# Patient Record
Sex: Female | Born: 1945 | Race: White | Hispanic: No | Marital: Married | Smoking: Never smoker
Health system: Southern US, Community
[De-identification: ages and names within clinical notes are randomized; demographics above are authoritative.]

## PROBLEM LIST (undated history)

## (undated) DIAGNOSIS — E669 Obesity, unspecified: Secondary | ICD-10-CM

## (undated) DIAGNOSIS — R42 Dizziness and giddiness: Secondary | ICD-10-CM

## (undated) DIAGNOSIS — I4510 Unspecified right bundle-branch block: Secondary | ICD-10-CM

## (undated) DIAGNOSIS — E7800 Pure hypercholesterolemia, unspecified: Secondary | ICD-10-CM

## (undated) DIAGNOSIS — I4892 Unspecified atrial flutter: Secondary | ICD-10-CM

## (undated) DIAGNOSIS — I10 Essential (primary) hypertension: Secondary | ICD-10-CM

## (undated) DIAGNOSIS — I5030 Unspecified diastolic (congestive) heart failure: Secondary | ICD-10-CM

## (undated) DIAGNOSIS — R5383 Other fatigue: Secondary | ICD-10-CM

## (undated) DIAGNOSIS — I480 Paroxysmal atrial fibrillation: Secondary | ICD-10-CM

## (undated) HISTORY — PX: TONSILLECTOMY: SUR1361

## (undated) HISTORY — DX: Pure hypercholesterolemia, unspecified: E78.00

## (undated) HISTORY — DX: Unspecified diastolic (congestive) heart failure: I50.30

## (undated) HISTORY — PX: OTHER SURGICAL HISTORY: SHX169

## (undated) HISTORY — PX: WRIST FRACTURE SURGERY: SHX121

## (undated) HISTORY — DX: Other fatigue: R53.83

## (undated) HISTORY — DX: Obesity, unspecified: E66.9

## (undated) SURGERY — COLONOSCOPY
Anesthesia: Moderate Sedation

## (undated) SURGERY — COLONOSCOPY WITH PROPOFOL
Anesthesia: Monitor Anesthesia Care

---

## 2012-11-01 ENCOUNTER — Emergency Department (HOSPITAL_COMMUNITY)
Admission: EM | Admit: 2012-11-01 | Discharge: 2012-11-01 | Disposition: A | Discharge status: Home / Self Care | Payer: Medicare FFS | Attending: Emergency Medicine | Admitting: Emergency Medicine

## 2012-11-01 ENCOUNTER — Telehealth: Payer: Self-pay | Admitting: Orthopedic Surgery

## 2012-11-01 ENCOUNTER — Emergency Department (HOSPITAL_COMMUNITY): Payer: Medicare FFS

## 2012-11-01 ENCOUNTER — Encounter (HOSPITAL_COMMUNITY): Payer: Self-pay | Admitting: Emergency Medicine

## 2012-11-01 DIAGNOSIS — S8263XA Displaced fracture of lateral malleolus of unspecified fibula, initial encounter for closed fracture: Secondary | ICD-10-CM | POA: Insufficient documentation

## 2012-11-01 DIAGNOSIS — S8261XA Displaced fracture of lateral malleolus of right fibula, initial encounter for closed fracture: Secondary | ICD-10-CM

## 2012-11-01 DIAGNOSIS — X500XXA Overexertion from strenuous movement or load, initial encounter: Secondary | ICD-10-CM | POA: Insufficient documentation

## 2012-11-01 DIAGNOSIS — Y929 Unspecified place or not applicable: Secondary | ICD-10-CM | POA: Insufficient documentation

## 2012-11-01 DIAGNOSIS — Y9389 Activity, other specified: Secondary | ICD-10-CM | POA: Insufficient documentation

## 2012-11-01 HISTORY — DX: Dizziness and giddiness: R42

## 2012-11-01 MED ORDER — HYDROCODONE-ACETAMINOPHEN 5-325 MG PO TABS: 1.0000 | ORAL_TABLET | ORAL | Status: DC | PRN

## 2012-11-01 NOTE — ED Notes (Signed)
Pt c/o R ankle pain and edema after stepping down from a chair and inverting the ankle.

## 2012-11-01 NOTE — Telephone Encounter (Signed)
Patient called, initially spoke with Aurea Graff, approximately 4:00pm today, 11/01/12, following [facility] visit for problem, fracture of right ankle.  Patient was offered first available appointment, Tuesday, 11/06/12, due to Dr. Romeo Apple in surgery tomorrow (Friday) and also on Monday, 11/05/12.  Patient states "I really don't feel I am able to wait"; therefore, she will check with other orthopaedic surgeons and call back if needs to accept the 11/06/12 appointment.  Her contact ph# is [phone].

## 2012-11-01 NOTE — ED Provider Notes (Signed)
Medical screening examination/treatment/procedure(s presents to the ER after a fall. Patient is a 67 year old female who presents complaining of pain after a fall. She is complaining of pain in the right ankle. She denies other injury or trauma) were conducted as a shared visit with non-physician practitioner(s) and myself.  I personally evaluated the patient during the encounter.    On exam the vitals are stable the patient is afebrile. The right ankle is noted to have swelling and tenderness to palpation over the lateral malleolus. Distal sensation, motor, and pulses are intact.  X-rays reveal an avulsion fracture of the distal fibula. This will be placed in a splint the patient will have orthopedic followup in the next 2 days for cast. She is to return as needed for any problems. In the meantime she is to ice, elevate, and non-weight-bear until that time.  Geoffery Lyons, MD 11/01/12 1726

## 2012-11-01 NOTE — ED Provider Notes (Signed)
CSN: [id number]     Arrival date & time 11/01/12  1417 History   First MD Initiated Contact with Patient 11/01/12 1431     Chief Complaint  Patient presents with  . Ankle Pain   (Consider location/radiation/quality/duration/timing/severity/associated sxs/prior Treatment) Patient is a 67 y.o. female presenting with ankle pain. The history is provided by the patient.  Ankle Pain Location:  Ankle Time since incident:  3 hours Injury: yes   Mechanism of injury: fall   Fall:    Fall occurred:  From a stool   Impact surface:  Hard floor   Point of impact:  Feet Ankle location:  R ankle Pain details:    Quality:  Aching and shooting   Radiates to:  Does not radiate   Severity:  Moderate   Onset quality:  Sudden   Timing:  Constant   Progression:  Unchanged Chronicity:  New Foreign body present:  No foreign bodies Prior injury to area:  No Relieved by:  Nothing Worsened by:  Bearing weight and activity Ineffective treatments:  None tried Associated symptoms: decreased ROM and swelling   Associated symptoms: no fever and no neck pain    Deven Audi is a 67 y.o. female who presents to the ED with right ankle pain. She was standing in a chair to reach something and when she stepped down lost her footing and when her foot came down the ankle rolled inward.   Past Medical History  Diagnosis Date  . Vertigo    Past Surgical History  Procedure Laterality Date  . Wrist fracture surgery     Family History  Problem Relation Age of Onset  . Cancer Other    History  Substance Use Topics  . Smoking status: Not on file  . Smokeless tobacco: Not on file  . Alcohol Use: Not on file   OB History   Grav Para Term Preterm Abortions TAB SAB Ect Mult Living   3 3 3       2      Review of Systems  Constitutional: Negative for fever and chills.  HENT: Negative for neck pain.   Respiratory: Negative for shortness of breath.   Cardiovascular: Negative for chest pain.   Gastrointestinal: Negative for nausea, vomiting and abdominal pain.  Musculoskeletal:       Right ankle pain   Skin: Negative for wound.  Psychiatric/Behavioral: Negative for confusion. The patient is not nervous/anxious.     Allergies  Review of patient's allergies indicates not on file.  Home Medications  No current outpatient prescriptions on file. Ht 5\' 8"  (1.727 m)  Wt 192 lb 8 oz (87.317 kg)  BMI 29.28 kg/m2 Physical Exam  Nursing note and vitals reviewed. Constitutional: She is oriented to person, place, and time. She appears well-developed and well-nourished. No distress.  HENT:  Head: Normocephalic and atraumatic.  Eyes: EOM are normal.  Neck: Normal range of motion. Neck supple.  Cardiovascular: Normal rate.   Pulmonary/Chest: Effort normal.  Abdominal: Soft. There is no tenderness.  Musculoskeletal:       Right ankle: She exhibits decreased range of motion and swelling. She exhibits no laceration and normal pulse. Tenderness. Lateral malleolus tenderness found. Achilles tendon normal.  Pedal pulse strong, adequate circulation, good touch sensation. Swelling noted to lateral aspect of the right ankle.  Neurological: She is alert and oriented to person, place, and time. She has normal strength. No cranial nerve deficit or sensory deficit. Gait (due to pain) abnormal.  Skin: Skin is  warm and dry.  Psychiatric: She has a normal mood and affect. Her behavior is normal.   Dg Ankle Complete Right  11/01/2012   *RADIOLOGY REPORT*  Clinical Data: Right ankle pain and swelling after fall.  RIGHT ANKLE - COMPLETE 3+ VIEW  Comparison: None.  Findings: Mildly displaced fracture of the distal fibula is noted with overlying soft tissue swelling.  Talar dome appears intact. Joint spaces are intact.  IMPRESSION: Mildly displaced lateral malleolar fracture with overlying soft tissue swelling.   Original Report Authenticated By: Lupita Raider.,  M.D.     ED Course: Dr. Judd Lien in to  examine the patient and discuss x-ray results.  Procedures  MDM  67 y.o. female with lateral malleolar fracture. Placed in posterior splint and given crutches and ice pack. Patient to follow up with Dr. Romeo Apple.  I have reviewed this patient's vital signs, nurses notes, appropriate imaging and discussed findings and plan of care with the patient and her husband. She voices understanding.  Patient remains neurovascularly intact and stable for discharge home without any immediate complications.   Medication List    TAKE these medications       HYDROcodone-acetaminophen 5-325 MG per tablet  Commonly known as:  NORCO/VICODIN  Take 1 tablet by mouth every 4 (four) hours as needed.      ASK your doctor about these medications       aspirin 81 MG tablet  Take 81 mg by mouth daily as needed for pain.     CALCIUM MAGNESIUM PO  Take 1 tablet by mouth daily.     FLAX SEEDS PO  Take 1 tablet by mouth daily.     OVER THE COUNTER MEDICATION  Take 1 tablet by mouth daily as needed (allergy).            [facility] Orlene Och, NP 11/01/12 1710

## 2013-01-20 ENCOUNTER — Emergency Department (HOSPITAL_COMMUNITY): Payer: Medicare FFS

## 2013-01-20 ENCOUNTER — Inpatient Hospital Stay (HOSPITAL_COMMUNITY)
Admission: EM | Admit: 2013-01-20 | Discharge: 2013-01-22 | DRG: 310 | Disposition: A | Discharge status: Home / Self Care | Payer: Medicare FFS | Attending: Family Medicine | Admitting: Family Medicine

## 2013-01-20 ENCOUNTER — Encounter (HOSPITAL_COMMUNITY): Payer: Self-pay | Admitting: Emergency Medicine

## 2013-01-20 DIAGNOSIS — IMO0001 Reserved for inherently not codable concepts without codable children: Secondary | ICD-10-CM | POA: Diagnosis present

## 2013-01-20 DIAGNOSIS — I4892 Unspecified atrial flutter: Secondary | ICD-10-CM | POA: Diagnosis present

## 2013-01-20 DIAGNOSIS — I10 Essential (primary) hypertension: Secondary | ICD-10-CM | POA: Diagnosis present

## 2013-01-20 DIAGNOSIS — Z7982 Long term (current) use of aspirin: Secondary | ICD-10-CM

## 2013-01-20 DIAGNOSIS — I4891 Unspecified atrial fibrillation: Secondary | ICD-10-CM

## 2013-01-20 DIAGNOSIS — E876 Hypokalemia: Secondary | ICD-10-CM | POA: Diagnosis present

## 2013-01-20 DIAGNOSIS — F3289 Other specified depressive episodes: Secondary | ICD-10-CM | POA: Diagnosis present

## 2013-01-20 DIAGNOSIS — F329 Major depressive disorder, single episode, unspecified: Secondary | ICD-10-CM

## 2013-01-20 DIAGNOSIS — F32A Depression, unspecified: Secondary | ICD-10-CM | POA: Diagnosis present

## 2013-01-20 HISTORY — DX: Paroxysmal atrial fibrillation: I48.0

## 2013-01-20 HISTORY — DX: Essential (primary) hypertension: I10

## 2013-01-20 LAB — MRSA PCR SCREENING: MRSA by PCR: NEGATIVE

## 2013-01-20 LAB — COMPREHENSIVE METABOLIC PANEL
ALT: 30 U/L (ref 0–35)
Albumin: 4 g/dL (ref 3.5–5.2)
Alkaline Phosphatase: 108 U/L (ref 39–117)
BUN: 14 mg/dL (ref 6–23)
Chloride: 101 mEq/L (ref 96–112)
Glucose, Bld: 106 mg/dL — ABNORMAL HIGH (ref 70–99)
Potassium: 3.3 mEq/L — ABNORMAL LOW (ref 3.5–5.1)
Sodium: 142 mEq/L (ref 135–145)
Total Bilirubin: 0.3 mg/dL (ref 0.3–1.2)

## 2013-01-20 LAB — CBC WITH DIFFERENTIAL/PLATELET
Basophils Relative: 1 % (ref 0–1)
Hemoglobin: 13.8 g/dL (ref 12.0–15.0)
Lymphs Abs: 1.6 10*3/uL (ref 0.7–4.0)
Monocytes Relative: 12 % (ref 3–12)
Neutro Abs: 3.2 10*3/uL (ref 1.7–7.7)
Neutrophils Relative %: 56 % (ref 43–77)
RBC: 4.7 MIL/uL (ref 3.87–5.11)

## 2013-01-20 LAB — TROPONIN I: Troponin I: <0.3 ng/mL (ref <0.30)

## 2013-01-20 LAB — MAGNESIUM: Magnesium: 2.1 mg/dL (ref 1.5–2.5)

## 2013-01-20 LAB — ETHANOL: Alcohol, Ethyl (B): <11 mg/dL (ref 0–11)

## 2013-01-20 MED ORDER — ACETAMINOPHEN 325 MG PO TABS
650.0000 mg | ORAL_TABLET | Freq: Four times a day (QID) | ORAL | Status: DC | PRN
  Administered 2013-01-20 – 2013-01-21 (×2): 650 mg via ORAL
  Filled 2013-01-20 (×2): qty 2

## 2013-01-20 MED ORDER — ASPIRIN EC 81 MG PO TBEC
81.0000 mg | DELAYED_RELEASE_TABLET | Freq: Every day | ORAL | Status: DC
  Filled 2013-01-20: qty 1

## 2013-01-20 MED ORDER — BISACODYL 5 MG PO TBEC: 5.0000 mg | DELAYED_RELEASE_TABLET | Freq: Every day | ORAL | Status: DC | PRN

## 2013-01-20 MED ORDER — DILTIAZEM HCL ER COATED BEADS 120 MG PO CP24
120.0000 mg | ORAL_CAPSULE | Freq: Every day | ORAL | Status: DC
  Administered 2013-01-20: 120 mg via ORAL
  Filled 2013-01-20: qty 1

## 2013-01-20 MED ORDER — DILTIAZEM HCL 25 MG/5ML IV SOLN
10.0000 mg | Freq: Once | INTRAVENOUS | Status: AC
  Administered 2013-01-20: 10 mg via INTRAVENOUS

## 2013-01-20 MED ORDER — FLEET ENEMA 7-19 GM/118ML RE ENEM: 1.0000 | ENEMA | Freq: Once | RECTAL | Status: AC | PRN

## 2013-01-20 MED ORDER — ENOXAPARIN SODIUM 40 MG/0.4ML ~~LOC~~ SOLN
40.0000 mg | Freq: Every day | SUBCUTANEOUS | Status: DC
  Administered 2013-01-20 – 2013-01-21 (×2): 40 mg via SUBCUTANEOUS
  Filled 2013-01-20 (×2): qty 0.4

## 2013-01-20 MED ORDER — NICARDIPINE HCL IN NACL 20-0.86 MG/200ML-% IV SOLN
INTRAVENOUS | Status: AC
  Filled 2013-01-20: qty 200

## 2013-01-20 MED ORDER — DILTIAZEM HCL 100 MG IV SOLR
5.0000 mg/h | Freq: Once | INTRAVENOUS | Status: AC
  Administered 2013-01-20: 5 mg/h via INTRAVENOUS
  Filled 2013-01-20: qty 100

## 2013-01-20 MED ORDER — TRAZODONE HCL 50 MG PO TABS
25.0000 mg | ORAL_TABLET | Freq: Every evening | ORAL | Status: DC | PRN
  Administered 2013-01-20: 25 mg via ORAL
  Filled 2013-01-20: qty 1

## 2013-01-20 MED ORDER — ADENOSINE 6 MG/2ML IV SOLN
6.0000 mg | Freq: Once | INTRAVENOUS | Status: AC
  Administered 2013-01-20: 6 mg via INTRAVENOUS

## 2013-01-20 MED ORDER — POTASSIUM CHLORIDE 10 MEQ/100ML IV SOLN
10.0000 meq | Freq: Once | INTRAVENOUS | Status: AC
  Administered 2013-01-20: 10 meq via INTRAVENOUS
  Filled 2013-01-20: qty 100

## 2013-01-20 MED ORDER — SODIUM CHLORIDE 0.9 % IV SOLN
INTRAVENOUS | Status: DC
  Administered 2013-01-20: 16:00:00 via INTRAVENOUS

## 2013-01-20 MED ORDER — POTASSIUM CHLORIDE CRYS ER 20 MEQ PO TBCR
40.0000 meq | EXTENDED_RELEASE_TABLET | ORAL | Status: AC
  Administered 2013-01-20 – 2013-01-21 (×3): 40 meq via ORAL
  Filled 2013-01-20 (×3): qty 2

## 2013-01-20 MED ORDER — DILTIAZEM HCL 100 MG IV SOLR
15.0000 mg/h | Freq: Once | INTRAVENOUS | Status: AC
  Administered 2013-01-20: 15 mg/h via INTRAVENOUS

## 2013-01-20 MED ORDER — ADENOSINE 6 MG/2ML IV SOLN
INTRAVENOUS | Status: AC
  Administered 2013-01-20: 6 mg via INTRAVENOUS
  Filled 2013-01-20: qty 2

## 2013-01-20 MED ORDER — DILTIAZEM HCL 25 MG/5ML IV SOLN
15.0000 mg | Freq: Once | INTRAVENOUS | Status: AC
  Administered 2013-01-20: 15 mg via INTRAVENOUS

## 2013-01-20 MED ORDER — ONDANSETRON HCL 4 MG/2ML IJ SOLN: 4.0000 mg | Freq: Four times a day (QID) | INTRAMUSCULAR | Status: DC | PRN

## 2013-01-20 MED ORDER — DILTIAZEM HCL 25 MG/5ML IV SOLN
INTRAVENOUS | Status: AC
  Administered 2013-01-20: 10 mg via INTRAVENOUS
  Filled 2013-01-20: qty 5

## 2013-01-20 NOTE — ED Provider Notes (Addendum)
CSN: [id number]     Arrival date & time 01/20/13  1538 History  This chart was scribed for Lindsay Baker, MD by Blanchard Kelch, ED Scribe. The patient was seen in room APA05/APA05. Patient's care was started at 3:51 PM.    Chief Complaint  Patient presents with  . Tachycardia    The history is provided by the patient. No language interpreter was used.    HPI Comments: Lindsay Whitehead is a 67 y.o. female who presents to the Emergency Department complaining of tachycardia that began an hour and a half ago. She was sitting on the couch watching a movie when it occurred. She had associated dizziness with the episode that has since subsided. She denies any chest pain or diaphoresis with the episode. She denies any aggravating or alleviating factors. She has had similar episodes in the past but denies knowing a diagnosis for the problem.She was seen by her PCP a year ago for a routine physical and was told she had an irregular EKG, but denies that she was diagnosed with a heart arrythmia, including SVT, atrial fibrillation or atrial fluttering. She does not take any prescription medications currently. She denies a past history of hypertension, hyperlipidemia or diabetes. She has not been sick in the past 24 hours. She drinks two cups of regular coffee a day.     Past Medical History  Diagnosis Date  . Vertigo   . Abnormal EKG    Past Surgical History  Procedure Laterality Date  . Wrist fracture surgery     Family History  Problem Relation Age of Onset  . Cancer Other    History  Substance Use Topics  . Smoking status: Never Smoker   . Smokeless tobacco: Never Used  . Alcohol Use: 0.0 oz/week    4-5 drink(s) per week   OB History   Grav Para Term Preterm Abortions TAB SAB Ect Mult Living   3 3 3       2      Review of Systems  Constitutional: Negative for diaphoresis.  Cardiovascular: Positive for palpitations. Negative for chest pain.  Neurological: Positive for dizziness.   All other systems reviewed and are negative.    Allergies  Bystolic and Metoprolol  Home Medications   Current Outpatient Rx  Name  Route  Sig  Dispense  Refill  . aspirin 81 MG tablet   Oral   Take 81 mg by mouth daily as needed for pain.         . Calcium-Magnesium-Vitamin D (CALCIUM MAGNESIUM PO)   Oral   Take 1 tablet by mouth daily.         . Flaxseed, Linseed, (FLAX SEEDS PO)   Oral   Take 1 tablet by mouth daily.         Marland Kitchen HYDROcodone-acetaminophen (NORCO/VICODIN) 5-325 MG per tablet   Oral   Take 1 tablet by mouth every 4 (four) hours as needed.   15 tablet   0   . OVER THE COUNTER MEDICATION   Oral   Take 1 tablet by mouth daily as needed (allergy).          Triage Vitals: BP 174/101  Pulse 157  Temp(Src) 98.1 F (36.7 C) (Oral)  Resp 20  Ht 5\' 8"  (1.727 m)  Wt 186 lb (84.369 kg)  BMI 28.29 kg/m2  SpO2 99%  Physical Exam  Nursing note and vitals reviewed. Constitutional: She is oriented to person, place, and time. She appears well-developed and well-nourished.  Non-toxic appearance. No distress.  HENT:  Head: Normocephalic and atraumatic.  Eyes: Conjunctivae, EOM and lids are normal. Pupils are equal, round, and reactive to light.  Neck: Normal range of motion. Neck supple. No tracheal deviation present. No mass present.  Cardiovascular: Normal heart sounds.  An irregular rhythm present. Tachycardia present.  Exam reveals no gallop.   No murmur heard. Pulmonary/Chest: Effort normal and breath sounds normal. No stridor. No respiratory distress. She has no decreased breath sounds. She has no wheezes. She has no rhonchi. She has no rales.  Abdominal: Soft. Normal appearance and bowel sounds are normal. She exhibits no distension. There is no tenderness. There is no rebound and no CVA tenderness.  Musculoskeletal: Normal range of motion. She exhibits no edema and no tenderness.  Neurological: She is alert and oriented to person, place, and time.  She has normal strength. No cranial nerve deficit or sensory deficit. GCS eye subscore is 4. GCS verbal subscore is 5. GCS motor subscore is 6.  Skin: Skin is warm and dry. No abrasion and no rash noted.  Psychiatric: She has a normal mood and affect. Her speech is normal and behavior is normal.    ED Course  Procedures (including critical care time)  DIAGNOSTIC STUDIES: Oxygen Saturation is 99% on room air, normal by my interpretation.    COORDINATION OF CARE: 4:02 PM -Will order CMP, CBC, Troponin I, T4, TSH, and BNP labs and a chest x-ray. Patient verbalizes understanding and agrees with treatment plan.    Labs Review Labs Reviewed  COMPREHENSIVE METABOLIC PANEL - Abnormal; Notable for the following:    Potassium 3.3 (*)    Glucose, Bld 106 (*)    GFR calc non Af Amer 69 (*)    GFR calc Af Amer 80 (*)    All other components within normal limits  CBC WITH DIFFERENTIAL  TROPONIN I  T4  TSH  PRO B NATRIURETIC PEPTIDE   Imaging Review Dg Chest Port 1 View  01/20/2013   CLINICAL DATA:  Chest pain  EXAM: PORTABLE CHEST - 1 VIEW  COMPARISON:  None.  FINDINGS: Cardiac leads project over the chest. Heart size appears prominent for portable technique. There is atherosclerotic calcification of the thoracic aortic arch. There is pulmonary vascular congestion. No visible pulmonary edema or focal airspace disease. No pleural effusion is appreciated. Negative for pneumothorax. The trachea is midline. No acute bony abnormality.  IMPRESSION: Probable cardiomegaly.  Pulmonary vascular congestion without edema.   Electronically Signed   By: Britta Mccreedy M.D.   On: 01/20/2013 16:35    EKG Interpretation    Date/Time:  Sunday January 20 2013 15:54:40 EST Ventricular Rate:  150 PR Interval:  112 QRS Duration: 110 QT Interval:  316 QTC Calculation: 499 R Axis:   -62 Text Interpretation:  Sinus tachycardia Incomplete right bundle branch block Left anterior fascicular block Marked ST  abnormality, possible inferior subendocardial injury Marked ST abnormality, possible anterior subendocardial injury Abnormal ECG No previous ECGs available Confirmed by Rohaan Durnil  MD, Gwenith Tschida ([id number]) on 01/20/2013 5:14:58 PM            MDM  No diagnosis found. Pt given adenosine without change to her rhythm--repeat cg shows afib, will given cardizem and monitor  5:15 PM Pt given cardizem and remained tachycardic with atrial fibrillation. Patient recalls again with Cardizem and her Cardizem drip was increased. She has remained hemodynamically stable. Will require admission.  5:50 PM Hospitalist requested I speak with cardiology about patient's EKG.  Spoke with Dr. Donnie Aho and he reviewed the patient's EKG he agreed that there is no signs of STEMI.  CRITICAL CARE Performed by: Lindsay Whitehead Total critical care time: 60 Critical care time was exclusive of separately billable procedures and treating other patients. Critical care was necessary to treat or prevent imminent or life-threatening deterioration. Critical care was time spent personally by me on the following activities: development of treatment plan with patient and/or surrogate as well as nursing, discussions with consultants, evaluation of patient's response to treatment, examination of patient, obtaining history from patient or surrogate, ordering and performing treatments and interventions, ordering and review of laboratory studies, ordering and review of radiographic studies, pulse oximetry and re-evaluation of patient's condition.      Lindsay Baker, MD 01/20/13 1716  Lindsay Baker, MD 01/20/13 [id number]

## 2013-01-20 NOTE — ED Notes (Signed)
Administered another 10mg  of cardizem per EDP Freida Busman

## 2013-01-20 NOTE — ED Notes (Signed)
Pt was sitting at home when she felt her heart beating faster, started becoming dizzy, denies any sob, or nausea, reports that she has had problems with her heart rate beating fast at times, had one episode of feeling like she was going to pass out last week, has been having indigestion. States that she feels better upon arrival to er.

## 2013-01-20 NOTE — H&P (Signed)
[facility] History and Physical  Lindsay Whitehead  WJX:[id number]  DOB: [birth year]   DOA: 01/20/2013   PCP:   Dr. Modesto Charon in [place]  Chief Complaint:  Palpitations and feeling tired since mid-day today  HPI: Lindsay Whitehead is a 67 y.o. female.   Denies significant past medical history; her blood pressure sometimes up sometimes down, if she takes blood pressure medications he gets very low. She has been having episodic flutterings in her chest the past few months, and feeling tired but has never been diagnosed with a cardiac arrhythmia. She has never seen a cardiologist. She has been told by her primary that she has an abnormal EKG and he prescribed some medication for her to take when she does not feel right, she is taken them because she doesn't understand what they are for, and she says her doctor as never explained clearly what they are for.  Drinks 4 cups of coffee and 4 cups of tea per day.  Drinks wine or 2 glasses of champagne twice per week. Denies illicit drug use.  She lives in [place] but has a winter home in [place], and notes dyspnea on exertion climbing hills he sent When she drove down from [place], she made sure to stop and walk around every 3 hours   Rewiew of Systems:   All systems negative except as marked bold or noted in the HPI;  Constitutional:    malaise, fever and chills. ;  Eyes:   eye pain, redness and discharge. ;  ENMT:   ear pain, hoarseness, nasal congestion, sinus pressure and sore throat. ;  Cardiovascular:    chest pain, palpitations, diaphoresis, dyspnea on exertion and peripheral edema.  Respiratory:   cough, hemoptysis, wheezing and stridor. ;  Gastrointestinal:  nausea, vomiting, diarrhea, constipation, abdominal pain, melena, blood in stool, hematemesis, jaundice and rectal bleeding. unusual weight loss..   Genitourinary:    frequency, dysuria, incontinence,flank pain and hematuria; Musculoskeletal:   back pain and neck  pain.  swelling and trauma.;  Skin: .  pruritus, rash, abrasions, bruising and skin lesion.; ulcerations Neuro:    headache, lightheadedness and neck stiffness.  weakness, altered level of consciousness, altered mental status, extremity weakness, burning feet, involuntary movement, seizure and syncope.  Psych:    anxiety, depression, insomnia, tearfulness, panic attacks, hallucinations, paranoia, suicidal or homicidal ideation      Past Medical History  Diagnosis Date  . Vertigo   . Abnormal EKG   . Hypertension     Past Surgical History  Procedure Laterality Date  . Wrist fracture surgery      Medications:  HOME MEDS: Prior to Admission medications   Medication Sig Start Date End Date Taking? Authorizing Provider  aspirin 81 MG tablet Take 81 mg by mouth daily as needed for pain.   Yes Historical Provider, MD  Calcium-Magnesium-Vitamin D (CALCIUM MAGNESIUM PO) Take 1 tablet by mouth 4 (four) times a week.    Yes Historical Provider, MD  Flaxseed, Linseed, (FLAX SEEDS PO) Take 1 tablet by mouth daily.   Yes Historical Provider, MD  LECITHIN PO Take 1 tablet by mouth 4 (four) times a week.   Yes Historical Provider, MD  Multiple Vitamins-Minerals (ZINC PO) Take 1 tablet by mouth 4 (four) times a week.   Yes Historical Provider, MD     Allergies:  Allergies  Allergen Reactions  . Bystolic [Nebivolol Hcl] Other (See Comments)    Extreme chest pains/ lowering of blood pressure causing dizziness, falling  . Metoprolol  Other (See Comments)    Extreme drop in blood pressure, vertigo resulted  . Lasix [Furosemide] Other (See Comments)    Cramping in thigh area     Social History:   reports that she has never smoked. She has never used smokeless tobacco. She reports that she drinks alcohol. She reports that she does not use illicit drugs.  Family History: Family History  Problem Relation Age of Onset  . Cancer Other      Physical Exam: Filed Vitals:   01/20/13 1755  01/20/13 1803 01/20/13 1833 01/20/13 2000  BP: 136/94   148/70  Pulse: 59 73 66   Temp:    98 F (36.7 C)  TempSrc:    Oral  Resp: 18 15 14 13   Height:      Weight:      SpO2: 97% 98% 97%    Blood pressure 148/70, pulse 66, temperature 98 F (36.7 C), temperature source Oral, resp. rate 13, height 5\' 8"  (1.727 m), weight 84.369 kg (186 lb), SpO2 97.00%. Body mass index is 28.29 kg/(m^2).   GEN:  Pleasant smiling but depressed-looking Caucasian lady lying bed ; cooperative with exam PSYCH:  alert and oriented x4;   affect is appropriate. HEENT: Mucous membranes pink and anicteric; PERRLA; EOM intact; no cervical lymphadenopathy nor thyromegaly or carotid bruit; no JVD; Breasts:: Not examined CHEST WALL: No tenderness CHEST: Normal respiration, clear to auscultation bilaterally HEART: Regular rate and rhythm; no murmurs rubs or gallops BACK: No kyphosis no scoliosis; no CVA tenderness ABDOMEN: Obese, soft non-tender; no masses, no organomegaly, normal abdominal bowel sounds; no intertriginous candida. Rectal Exam: Not done EXTREMITIES:  age-appropriate arthropathy of the hands and knees; no edema; no ulcerations. Genitalia: not examined PULSES: 2+ and symmetric SKIN: Normal hydration no rash or ulceration CNS: Cranial nerves 2-12 grossly intact no focal lateralizing neurologic deficit   Labs on Admission:  Basic Metabolic Panel:  Recent Labs Lab 01/20/13 1613  NA 142  K 3.3*  CL 101  CO2 30  GLUCOSE 106*  BUN 14  CREATININE 0.85  CALCIUM 9.9   Liver Function Tests:  Recent Labs Lab 01/20/13 1613  AST 33  ALT 30  ALKPHOS 108  BILITOT 0.3  PROT 8.0  ALBUMIN 4.0   No results found for this basename: LIPASE, AMYLASE,  in the last 168 hours No results found for this basename: AMMONIA,  in the last 168 hours CBC:  Recent Labs Lab 01/20/13 1613  WBC 5.6  NEUTROABS 3.2  HGB 13.8  HCT 41.8  MCV 88.9  PLT 276   Cardiac Enzymes:  Recent Labs Lab  01/20/13 1613  TROPONINI <0.30   BNP: No components found with this basename: POCBNP,  D-dimer: No components found with this basename: D-DIMER,  CBG: No results found for this basename: GLUCAP,  in the last 168 hours  Radiological Exams on Admission: Dg Chest Port 1 View  01/20/2013   CLINICAL DATA:  Chest pain  EXAM: PORTABLE CHEST - 1 VIEW  COMPARISON:  None.  FINDINGS: Cardiac leads project over the chest. Heart size appears prominent for portable technique. There is atherosclerotic calcification of the thoracic aortic arch. There is pulmonary vascular congestion. No visible pulmonary edema or focal airspace disease. No pleural effusion is appreciated. Negative for pneumothorax. The trachea is midline. No acute bony abnormality.  IMPRESSION: Probable cardiomegaly.  Pulmonary vascular congestion without edema.   Electronically Signed   By: Britta Mccreedy M.D.   On: 01/20/2013 16:35  EKG: Independently reviewed. Current EKG : Sinus rhythm with occasional atrial ectopics; incomplete right bundle branch block.  Assessment/Plan   Active Problems:   Atrial fibrillation with rapid ventricular response   Hypokalemia   Elevated blood pressure   Depression  PLAN: Discontinue Cardizem drip since he is now converted to sinus rhythm Potassium replacement Start low-dose long-acting Cardizem Discussed the nature of atrial fibrillation and get cardio referral as an in or outpatient  Other plans as per orders.  Code Status: full    Lindsay Whitehead Nocturnist [facility] Pager [phone]   01/20/2013, 8:49 PM

## 2013-01-20 NOTE — ED Notes (Signed)
AC called for Cardizem drip.

## 2013-01-20 NOTE — ED Notes (Signed)
Pt has converted on monitor, HR 81 NSR, will confirm with EKG

## 2013-01-21 ENCOUNTER — Encounter (HOSPITAL_COMMUNITY): Payer: Self-pay | Admitting: Adult Health

## 2013-01-21 DIAGNOSIS — I369 Nonrheumatic tricuspid valve disorder, unspecified: Secondary | ICD-10-CM

## 2013-01-21 LAB — COMPREHENSIVE METABOLIC PANEL
ALT: 26 U/L (ref 0–35)
Albumin: 3.6 g/dL (ref 3.5–5.2)
Alkaline Phosphatase: 89 U/L (ref 39–117)
BUN: 9 mg/dL (ref 6–23)
CO2: 29 mEq/L (ref 19–32)
Calcium: 9.3 mg/dL (ref 8.4–10.5)
Creatinine, Ser: 0.7 mg/dL (ref 0.50–1.10)
GFR calc Af Amer: >90 mL/min (ref >90)
GFR calc non Af Amer: 88 mL/min — ABNORMAL LOW (ref >90)
Glucose, Bld: 104 mg/dL — ABNORMAL HIGH (ref 70–99)
Potassium: 4.1 mEq/L (ref 3.5–5.1)
Sodium: 141 mEq/L (ref 135–145)
Total Bilirubin: 0.5 mg/dL (ref 0.3–1.2)
Total Protein: 6.9 g/dL (ref 6.0–8.3)

## 2013-01-21 LAB — URINALYSIS, ROUTINE W REFLEX MICROSCOPIC
Bilirubin Urine: NEGATIVE
Glucose, UA: NEGATIVE mg/dL
Ketones, ur: NEGATIVE mg/dL
Leukocytes, UA: NEGATIVE
Nitrite: NEGATIVE
Protein, ur: NEGATIVE mg/dL

## 2013-01-21 LAB — CBC
HCT: 36.8 % (ref 36.0–46.0)
Hemoglobin: 12 g/dL (ref 12.0–15.0)
MCH: 29.2 pg (ref 26.0–34.0)
MCHC: 32.6 g/dL (ref 30.0–36.0)
MCV: 89.5 fL (ref 78.0–100.0)
RDW: 13.5 % (ref 11.5–15.5)

## 2013-01-21 LAB — HEMOGLOBIN A1C
Hgb A1c MFr Bld: 5.4 % (ref <5.7)
Mean Plasma Glucose: 108 mg/dL (ref <117)

## 2013-01-21 MED ORDER — DILTIAZEM HCL 30 MG PO TABS
30.0000 mg | ORAL_TABLET | Freq: Four times a day (QID) | ORAL | Status: DC
  Administered 2013-01-21 – 2013-01-22 (×4): 30 mg via ORAL
  Filled 2013-01-21 (×4): qty 1

## 2013-01-21 MED ORDER — ASPIRIN EC 325 MG PO TBEC
325.0000 mg | DELAYED_RELEASE_TABLET | Freq: Every day | ORAL | Status: DC
  Administered 2013-01-21 – 2013-01-22 (×2): 325 mg via ORAL
  Filled 2013-01-21 (×2): qty 1

## 2013-01-21 NOTE — Progress Notes (Signed)
[facility] PROGRESS NOTE  Lindsay Whitehead ZOX:[id number] DOB: May 29, 1945 DOA: 01/20/2013 PCP: No primary provider on file. Dr. Modesto Charon in [place]   Assessment/Plan: 1. Atrial fibrillation with rapid ventricular response: Probably not a new diagnosis. Currently in sinus rhythm after treatment with Cardizem infusion. CHADs = 1.    Aspirin 325 mg daily. Echocardiogram. Further recommendations per cardiology.  Possible discharge later today versus in the morning depending on cardiology recommendations  Pending studies:   none  Code Status: full code DVT prophylaxis: Lovenox Family Communication: discussed with husband at bedside Disposition Plan: home when ready  [name], MD  [facility]  Pager [phone] If 7PM-7AM, please contact night-coverage at [email or web address], password [facility] 01/21/2013, 10:06 AM  LOS: 1 day   Summary: 67 year old woman presented to the emergency department complaining of rapid heart rate. She is found to have atrial fibrillation with rapid ventricular response and admitted for further evaluation.  Consultants:  Cardiology  Procedures:  2-D echocardiogram  Antibiotics:    HPI/Subjective: Converted to sinus rhythm last evening. Doing well today without complaint.  Objective: Filed Vitals:   01/21/13 0300 01/21/13 0400 01/21/13 0500 01/21/13 0700  BP: 129/66 126/73    Pulse:      Temp:  97.5 F (36.4 C)  97.8 F (36.6 C)  TempSrc:  Oral  Oral  Resp: 13 12 13    Height:      Weight:   86 kg (189 lb 9.5 oz)   SpO2:        Intake/Output Summary (Last 24 hours) at 01/21/13 1006 Last data filed at 01/21/13 0900  Gross per 24 hour  Intake    220 ml  Output      0 ml  Net    220 ml     Filed Weights   01/20/13 1545 01/21/13 0500  Weight: 84.369 kg (186 lb) 86 kg (189 lb 9.5 oz)    Exam:   Afebrile, vital signs stable.  General: Appears calm and comfortable.  Cardiovascular: Regular rate and rhythm. No  murmur, rub or gallop. No lower extremity edema.  Telemetry: Sinus rhythm.  Respiratory: Clear to auscultation bilaterally. No wheezes, rales or rhonchi. Normal respiratory effort.  Data Reviewed:  Basic metabolic panel unremarkable. Normal hepatic function. Potassium 4.1.  CBC unremarkable  TSH normal, T4 normal  Urinalysis unremarkable  Chest x-ray probable cardiomegaly, pulmonary vascular congestion without edema   EKG shows sinus rhythm, incomplete right bundle-branch block pattern   Scheduled Meds: . aspirin EC  325 mg Oral Daily  . diltiazem  30 mg Oral Q6H  . enoxaparin (LOVENOX) injection  40 mg Subcutaneous QHS   Continuous Infusions:   Active Problems:   Atrial fibrillation with rapid ventricular response   Hypokalemia   Elevated blood pressure   Depression

## 2013-01-21 NOTE — Consult Note (Addendum)
CARDIOLOGY CONSULT NOTE   Patient ID: Lindsay Whitehead MRN: [id number] DOB/AGE: 67/14/1947 66 y.o.  Admit Date: 01/20/2013 Referring Physician: PTH Primary Physician: No primary provider on file. Consulting Cardiologist: Dina Rich MD Primary Cardiologist: New Reason for Consultation: Atrial fib with RVR  Clinical Summary Lindsay Whitehead is a 67 y.o.female with no prior documented cardiac history admitted with atrial fibrillation with RVR. Lindsay Whitehead winters in [place] from October to the spring.  Lindsay Whitehead is followed by Dr. Modesto Charon in [place], [place].Lindsay Whitehead was seen by him for annual physical in October of 2014, and was told that her HR was irregular. Lindsay Whitehead is followed for hypertension by Dr. Modesto Charon. No prior cardiac testing has been competed.  Lindsay Whitehead states that Lindsay Whitehead has been having palpitations for over 5 years but not-sustained. Over the last few months, however, Lindsay Whitehead has noticed this more, lasting up to 30 minutes. Lindsay Whitehead has also noticed decreased engery level and DOE. Her GP told her it was related Lindsay Whitehead usually lies down and drinks water for relief. Lindsay Whitehead states the day of admission, Lindsay Whitehead was having a busy day, with shopping, and cleaning. Was watching television and began to have palpitations. Resting and drinking water did not help.    Lindsay Whitehead presented ER with HR of  157 bpm, BP 174//101.Lindsay Whitehead states that Lindsay Whitehead did not take her antihypertensives as they did not help and make her feel badly, causing tightness in her throat or dizziness. Lindsay Whitehead was found to be hypokalemic at  3.3, repleted and placed on a dilitazem gtt after bolus. Converted to NSR before leaving the ER and being admitted to the ICU. Gtt was discontinued and Lindsay Whitehead was placed on long-acting cardizem. Echo has been ordered. Lindsay Whitehead is without complaint of shortness of breath, dizziness or chest pain.   Allergies  Allergen Reactions  . Bystolic [Nebivolol Hcl] Other (See Comments)    Extreme chest pains/ lowering of blood pressure causing dizziness,  falling  . Metoprolol Other (See Comments)    Extreme drop in blood pressure, vertigo resulted  . Lasix [Furosemide] Other (See Comments)    Cramping in thigh area     Medications Scheduled Medications: . aspirin EC  81 mg Oral Daily  . diltiazem  120 mg Oral QHS  . enoxaparin (LOVENOX) injection  40 mg Subcutaneous QHS          PRN Medications:  acetaminophen, bisacodyl, ondansetron (ZOFRAN) IV, traZODone   Past Medical History  Diagnosis Date  . Vertigo   . Abnormal EKG   . Hypertension     Past Surgical History  Procedure Laterality Date  . Wrist fracture surgery      Family History  Problem Relation Age of Onset  . Cancer Other     Social History Lindsay Whitehead reports that Lindsay Whitehead has never smoked. Lindsay Whitehead has never used smokeless tobacco. Lindsay Whitehead reports that Lindsay Whitehead drinks alcohol.  Review of Systems Otherwise reviewed and negative except as outlined.  Physical Examination Blood pressure 126/73, pulse 66, temperature 97.8 F (36.6 C), temperature source Oral, resp. rate 13, height 5\' 8"  (1.727 m), weight 189 lb 9.5 oz (86 kg), SpO2 97.00%.  Intake/Output Summary (Last 24 hours) at 01/21/13 0820 Last data filed at 01/21/13 0000  Gross per 24 hour  Intake    100 ml  Output      0 ml  Net    100 ml    Telemetry: NSR rates in the 60's.   HEENT: Conjunctiva and lids normal, oropharynx clear with moist mucosa.  Neck: Supple, no elevated JVP or carotid bruits, no thyromegaly. Lungs: Clear to auscultation, nonlabored breathing at rest. Cardiac: Regular rate and rhythm, no S3 or significant systolic murmur, no pericardial rub. Abdomen: Soft, nontender, no hepatomegaly, bowel sounds present, no guarding or rebound. Extremities: No pitting edema, distal pulses 2+. Skin: Warm and dry. Musculoskeletal: No kyphosis. Neuropsychiatric: Alert and oriented x3, affect grossly appropriate.  Prior Cardiac Testing/Procedures  Lab Results  Basic Metabolic  Panel:  Recent Labs Lab 01/20/13 1613 01/21/13 0448  NA 142 141  K 3.3* 4.1  CL 101 105  CO2 30 29  GLUCOSE 106* 104*  BUN 14 9  CREATININE 0.85 0.70  CALCIUM 9.9 9.3  MG 2.1  --     Liver Function Tests:  Recent Labs Lab 01/20/13 1613 01/21/13 0448  AST 33 27  ALT 30 26  ALKPHOS 108 89  BILITOT 0.3 0.5  PROT 8.0 6.9  ALBUMIN 4.0 3.6    CBC:  Recent Labs Lab 01/20/13 1613 01/21/13 0448  WBC 5.6 3.7*  NEUTROABS 3.2  --   HGB 13.8 12.0  HCT 41.8 36.8  MCV 88.9 89.5  PLT 276 219    Cardiac Enzymes:  Recent Labs Lab 01/20/13 1613  TROPONINI <0.30     Radiology: Dg Chest Port 1 View  01/20/2013   CLINICAL DATA:  Chest pain  EXAM: PORTABLE CHEST - 1 VIEW  COMPARISON:  None.  FINDINGS: Cardiac leads project over the chest. Heart size appears prominent for portable technique. There is atherosclerotic calcification of the thoracic aortic arch. There is pulmonary vascular congestion. No visible pulmonary edema or focal airspace disease. No pleural effusion is appreciated. Negative for pneumothorax. The trachea is midline. No acute bony abnormality.  IMPRESSION: Probable cardiomegaly.  Pulmonary vascular congestion without edema.   Electronically Signed   By: Britta Mccreedy M.D.   On: 01/20/2013 16:35     ECG:  NSR with PVC's and lateral T-wave abnormalites   Impression and Recommendations:  1. Atrial Fib: Now converted to NSR on cardizem gtt, and has been transitioned to NSR. CHADs Score 1 for hypertension. Lindsay Whitehead is slightly hypotensive this am. Will decrease diltiazem to 30 mg Q 6 hrs with parameters for HR and BP. Echo is ordered for evaluation of LV fx and LA enlargement. Lindsay Whitehead will be placed on ASA 325 mg daily. Repeat EKG.  Troponin is negative.  2.Hypertension: Much better controlled currently with diltiazem only. Will monitor response with change in dose. Medically non-compliant before admission due to side effects of throat pain and dizziness.    3.Obesity: BMI 28.3. Lindsay Whitehead has not been active as Lindsay Whitehead has been more tired and having DOE. After work-up, would recommend exercise program when safe to do so.      Signed: Bettey Mare. Lyman Bishop NP [facility] 01/21/2013, 8:20 AM Co-Sign MD  Attending Note Patient seen and discussed with NP Lyman Bishop, agree with documentation above. 67 yo female with limited past medical history admitted with atrial fibrillation with RVR. Lindsay Whitehead was started on a diltiazem drip, and has since converted to NSR, Lindsay Whitehead was converted to long acting dilitazem 120mg  with first dose last night. Today Lindsay Whitehead is in sinus rhythm with heart rates in 60s with low normal blood pressures. Recommend changing to short acting diltiazem to allow more room for titration or held doses given her low normal blood pressures and pulse currently. Her history of HTN is somewhat unclear after talking with her, Lindsay Whitehead is not currently on any  home therapy. Her CHADS2 score would be 1 if Lindsay Whitehead truly is hypertensive, CHADS2Vasc score is 2. We addressed benefits of ASA vs. anticoagulation this morning, will discuss further today, continue ASA 325 mg daily for now. Follow up echo results.   Dina Rich MD

## 2013-01-21 NOTE — Care Management Note (Signed)
    Page 1 of 1   01/21/2013     3:13:50 PM   CARE MANAGEMENT NOTE 01/21/2013  Patient:  Lindsay Whitehead, Lindsay Whitehead   Account Number:  [id number]  Date Initiated:  01/21/2013  Documentation initiated by:  Sharrie Rothman  Subjective/Objective Assessment:   Pt admitted from home with a fib. Pt lives with her husband and will return home at discharge. Pt is currently relocating to this area from [place]. Pt stated that her insurance will not cover PCP care here.     Action/Plan:   CM did encourage pt to look at [facility] policies and choose one that would benefit her so that she could find PCP here. Also gave pt options for insurance companies and SS administration for help.   Anticipated DC Date:  01/22/2013   Anticipated DC Plan:  HOME/SELF CARE      DC Planning Services  CM consult      Choice offered to / List presented to:             Status of service:  Completed, signed off Medicare Important Message given?   (If response is "NO", the following Medicare IM given date fields will be blank) Date Medicare IM given:   Date Additional Medicare IM given:    Discharge Disposition:  HOME/SELF CARE  Per UR Regulation:    If discussed at Long Length of Stay Meetings, dates discussed:    Comments:  01/21/13 1515 Arlyss Queen, RN BSN CM

## 2013-01-21 NOTE — Progress Notes (Signed)
*  PRELIMINARY RESULTS* Echocardiogram 2D Echocardiogram has been performed.  Lindsay Whitehead 01/21/2013, 11:21 AM

## 2013-01-21 NOTE — Progress Notes (Signed)
UR chart review completed.  

## 2013-01-21 NOTE — Progress Notes (Signed)
Report called and given to Lindsay Lobe, RN. Patient alert, oriented and in stable condition at the time of transport. Patient being transported to dept 300, room 327 by NT in wheelchair. Patient's chart and belongings transported with her.

## 2013-01-22 ENCOUNTER — Encounter (HOSPITAL_COMMUNITY): Payer: Self-pay | Admitting: Adult Health

## 2013-01-22 DIAGNOSIS — R030 Elevated blood-pressure reading, without diagnosis of hypertension: Secondary | ICD-10-CM

## 2013-01-22 DIAGNOSIS — I4891 Unspecified atrial fibrillation: Principal | ICD-10-CM

## 2013-01-22 MED ORDER — DILTIAZEM HCL ER COATED BEADS 120 MG PO CP24: 120.0000 mg | ORAL_CAPSULE | Freq: Every day | ORAL | Status: DC

## 2013-01-22 MED ORDER — ASPIRIN 325 MG PO TBEC: 325.0000 mg | DELAYED_RELEASE_TABLET | Freq: Every day | ORAL | Status: DC

## 2013-01-22 MED ORDER — DILTIAZEM HCL ER COATED BEADS 120 MG PO CP24
120.0000 mg | ORAL_CAPSULE | Freq: Every day | ORAL | Status: DC
Start: 2013-01-22 — End: 2013-01-22
  Administered 2013-01-22: 120 mg via ORAL
  Filled 2013-01-22: qty 1

## 2013-01-22 NOTE — Progress Notes (Signed)
Pt discharged home today per Dr. Irene Limbo. Pt's IV site D/C'd and WNL. Pt's VS stable at this time. Pt provided with home medication list, discharge instructions and where to pick up prescriptions. Verbalized understanding. Pt left floor via WC in stable condition accompanied by NT.

## 2013-01-22 NOTE — Discharge Summary (Signed)
Physician Discharge Summary  Lindsay Whitehead ZOX:[id number] DOB: [birth year] DOA: 01/20/2013  PCP: No primary provider on file.  Admit date: 01/20/2013 Discharge date: 01/22/2013  Recommendations for Outpatient Follow-up:  1. Atrial fibrillation   Follow-up Information   Follow up with Lindsay Reining, NP On 02/08/2013. (1:50 pm)    Specialty:  Nurse Practitioner   Contact information:   [address] [place] [address] [phone]      Discharge Diagnoses:  1. 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 She is found to have atrial fibrillation with rapid ventricular response and admitted for further evaluation.  Hospital Course:  She quickly converted to sinus rhythm on IV diltiazem. She was seen in consultation with cardiology, based on clinical assessment aspirin has been recommended. Hospitalization uncomplicated and she is now stable for discharge. She continue full-strength aspirin, long-acting diltiazem and followup with cardiology in 2 weeks.  Consultants:  Cardiology Procedures:  2-D echocardiogram: Left ventricular ejection fraction 50-55%. Normal wall motion. No regional wall motion abnormalities. Indeterminate diastolic function. Antibiotics: none  Discharge Instructions  Discharge Orders   Future Appointments Provider Department Dept Phone   02/08/2013 1:50 PM Jodelle Gross, NP [facility] [phone]   Future Orders Complete By Expires   Activity as tolerated - No restrictions  As directed    Diet - low sodium heart healthy  As directed    Discharge instructions  As directed     Comments:     Call your physician or seek immediate medical attention for chest pain, rapid heart rate, shortness of breath or worsening of condition.       Medication List    STOP taking these medications       aspirin 81 MG tablet  Replaced by:  aspirin 325 MG EC tablet      TAKE these medications       aspirin 325 MG EC tablet  Take 1 tablet (325 mg total) by mouth daily.     CALCIUM MAGNESIUM PO  Take 1 tablet by mouth 4 (four) times a week.     diltiazem 120 MG 24 hr capsule  Commonly known as:  CARDIZEM CD  Take 1 capsule (120 mg total) by mouth daily.     FLAX SEEDS PO  Take 1 tablet by mouth daily.     LECITHIN PO  Take 1 tablet by mouth 4 (four) times a week.     ZINC PO  Take 1 tablet by mouth 4 (four) times a week.       Allergies  Allergen Reactions  . Bystolic [Nebivolol Hcl] Other (See Comments)    Extreme chest pains/ lowering of blood pressure causing dizziness, falling  . Metoprolol Other (See Comments)    Extreme drop in blood pressure, vertigo resulted  . Lasix [Furosemide] Other (See Comments)    Cramping in thigh area     The results of significant diagnostics from this hospitalization (including imaging, microbiology, ancillary and laboratory) are listed below for reference.    Significant Diagnostic Studies: Dg Chest Port 1 View  01/20/2013   CLINICAL DATA:  Chest pain  EXAM: PORTABLE CHEST - 1  VIEW  COMPARISON:  None.  FINDINGS: Cardiac leads project over the chest. Heart size appears prominent for portable technique. There is atherosclerotic calcification of the thoracic aortic arch. There is pulmonary vascular congestion. No visible pulmonary edema or focal airspace disease. No pleural effusion is appreciated. Negative for pneumothorax. The trachea is midline. No acute bony abnormality.  IMPRESSION: Probable cardiomegaly.  Pulmonary vascular congestion without edema.   Electronically Signed   By: Britta Mccreedy M.D.   On: 01/20/2013 16:35     Microbiology: Recent Results (from the past 240 hour(s))  MRSA PCR SCREENING     Status: None   Collection Time    01/20/13  7:12 PM      Result Value Range Status   MRSA by PCR NEGATIVE  NEGATIVE Final   Comment:            The GeneXpert MRSA Assay (FDA     approved for NASAL specimens     only), is one component of a     comprehensive MRSA colonization     surveillance program. It is not     intended to diagnose MRSA     infection nor to guide or     monitor treatment for     MRSA infections.     Labs: Basic Metabolic Panel:  Recent Labs Lab 01/20/13 1613 01/21/13 0448  NA 142 141  K 3.3* 4.1  CL 101 105  CO2 30 29  GLUCOSE 106* 104*  BUN 14 9  CREATININE 0.85 0.70  CALCIUM 9.9 9.3  MG 2.1  --    Liver Function Tests:  Recent Labs Lab 01/20/13 1613 01/21/13 0448  AST 33 27  ALT 30 26  ALKPHOS 108 89  BILITOT 0.3 0.5  PROT 8.0 6.9  ALBUMIN 4.0 3.6   CBC:  Recent Labs Lab 01/20/13 1613 01/21/13 0448  WBC 5.6 3.7*  NEUTROABS 3.2  --   HGB 13.8 12.0  HCT 41.8 36.8  MCV 88.9 89.5  PLT 276 219   Cardiac Enzymes:  Recent Labs Lab 01/20/13 1613  TROPONINI <0.30     Recent Labs  01/20/13 1626  PROBNP 433.4*    Active Problems:   Atrial fibrillation with rapid ventricular response   Hypokalemia   Elevated blood pressure   Depression   Time coordinating discharge: 20 minutes  Signed:  [name], MD [facility] 01/22/2013, 1:46 PM

## 2013-01-22 NOTE — Progress Notes (Signed)
Consulting cardiologist: Dr. Dina Rich  Subjective:    Feels great! Wants to go home.  Objective:   Temp:  [97.7 F (36.5 C)-99.1 F (37.3 C)] 98.1 F (36.7 C) (12/02 0658) Pulse Rate:  [57-73] 73 (12/02 1100) Resp:  [18-20] 18 (12/02 0658) BP: (130-157)/(72-80) 157/80 mmHg (12/02 0658) SpO2:  [95 %-96 %] 95 % (12/02 0658) Weight:  [184 lb 15.5 oz (83.9 kg)] 184 lb 15.5 oz (83.9 kg) (12/02 0921) Last BM Date: 01/22/13  Filed Weights   01/20/13 1545 01/21/13 0500 01/22/13 0921  Weight: 186 lb (84.369 kg) 189 lb 9.5 oz (86 kg) 184 lb 15.5 oz (83.9 kg)    Intake/Output Summary (Last 24 hours) at 01/22/13 1308 Last data filed at 01/22/13 0900  Gross per 24 hour  Intake    240 ml  Output      0 ml  Net    240 ml    Telemetry: NSR rates in the 60's.   Exam:  General: No acute distress.  Lungs: Clear to auscultation, nonlabored.  Cardiac: No elevated JVP or bruits. RRR, no gallop or rub.   Extremities: No pitting edema, distal pulses full.   Lab Results:  Basic Metabolic Panel:  Recent Labs Lab 01/20/13 1613 01/21/13 0448  NA 142 141  K 3.3* 4.1  CL 101 105  CO2 30 29  GLUCOSE 106* 104*  BUN 14 9  CREATININE 0.85 0.70  CALCIUM 9.9 9.3  MG 2.1  --     Liver Function Tests:  Recent Labs Lab 01/20/13 1613 01/21/13 0448  AST 33 27  ALT 30 26  ALKPHOS 108 89  BILITOT 0.3 0.5  PROT 8.0 6.9  ALBUMIN 4.0 3.6    CBC:  Recent Labs Lab 01/20/13 1613 01/21/13 0448  WBC 5.6 3.7*  HGB 13.8 12.0  HCT 41.8 36.8  MCV 88.9 89.5  PLT 276 219    Echocardiogram: 01/21/2013 Study data: Technically adequate study. - Left ventricle: The cavity size was normal. Wall thickness was normal. Systolic function was at the lower limits of normal. The estimated ejection fraction was in the range of 50% to 55%. Wall motion was normal; there were no regional wall motion abnormalities. Indeterminate diastolic function. There is evidence of elevated  left atrial pressure (E/e' 11) - Aortic valve: Valve area: 1.52cm^2(VTI). - Left atrium: The atrium was moderately dilated. - Right ventricle: The cavity size was mildly dilated. - Right atrium: The atrium was moderately dilated.    Medications:   Scheduled Medications: . aspirin EC  325 mg Oral Daily  . diltiazem  30 mg Oral Q6H  . enoxaparin (LOVENOX) injection  40 mg Subcutaneous QHS    PRN Medications: acetaminophen, bisacodyl, ondansetron (ZOFRAN) IV, traZODone   Assessment and Plan:   1. Atrial fibrillation: Remains in NSR with occasional PAC's. Will continue diltiazem but change to long acting dose 120 mg daily (was on 30 mg Q 6). I have explained to her that she may have some mild LEE by the end of the day if she is on her feet a lot. She verbalizes understanding. CHADS2 score is 1 with hypertension. Will see her in the office in 2-3 weeks on follow up. Keep on ASA 325 mg daily. Appt for follow up made for 2 weeks with our office.   2. Hypertension: :Labile during admission. Will follow up as OP for ongoing assessment. She will also need to be established with PCP locally.   Bettey Mare. Lyman Bishop NP  [facility] 01/22/2013, 1:08 PM   Attending note:  Discussed case with hospitalist team. Modified above note by Ms. Lawrence NP. Patient is being discharged home today on aspirin and Cardizem CD 120 mg daily. She will have followup arranged in the cardiology office with Dr. Wyline Mood.  Jonelle Sidle, M.D., F.A.C.C.

## 2013-01-22 NOTE — Progress Notes (Signed)
[facility] PROGRESS NOTE  Lindsay Whitehead WUJ:[id number] DOB: September 05, 1945 DOA: 01/20/2013 PCP: No primary provider on file. Dr. Modesto Charon in [place]   Assessment/Plan: 1. Atrial fibrillation with rapid ventricular response: Remains in sinus rhythm status post diltiazem infusion.   Continue aspirin 325 mg daily. Continue long-acting oral diltiazem. CHADS2 score is 1 with hypertension.  Followup with cardiology as an outpatient in 2 weeks   Pending studies:   none  Code Status: full code DVT prophylaxis: Lovenox Family Communication: discussed with husband at bedside Disposition Plan: home when ready  [name], MD  [facility]  Pager [phone] If 7PM-7AM, please contact night-coverage at [email or web address], password [facility] 01/22/2013, 11:20 AM  LOS: 2 days   Summary: 67 year old woman presented to the emergency department complaining of rapid heart rate. She is found to have atrial fibrillation with rapid ventricular response and admitted for further evaluation.  Consultants:  Cardiology  Procedures:  2-D echocardiogram: Left ventricular ejection fraction 50-55%. Normal wall motion. No regional wall motion abnormalities. Indeterminate diastolic function.  Antibiotics:    HPI/Subjective: Feels good. Breathing fine. Mild headache. Wants to go home.  Objective: Filed Vitals:   01/21/13 2307 01/22/13 0158 01/22/13 0658 01/22/13 0921  BP: 130/75 134/72 157/80   Pulse: 57 59 68   Temp: 97.7 F (36.5 C) 98 F (36.7 C) 98.1 F (36.7 C)   TempSrc:  Oral Oral   Resp: 18 20 18    Height:      Weight:    83.9 kg (184 lb 15.5 oz)  SpO2: 95% 96% 95%     Intake/Output Summary (Last 24 hours) at 01/22/13 1120 Last data filed at 01/21/13 1300  Gross per 24 hour  Intake    240 ml  Output      0 ml  Net    240 ml     Filed Weights   01/20/13 1545 01/21/13 0500 01/22/13 0921  Weight: 84.369 kg (186 lb) 86 kg (189 lb 9.5 oz) 83.9 kg (184 lb 15.5 oz)     Exam:   Afebrile, vital signs stable. Heart rate controlled, no hypotension. No hypoxia  General: Appears calm and comfortable.  Cardiovascular: Regular rate and rhythm. No murmur, rub or gallop.  Respiratory: Clear to auscultation bilaterally. No wheezes, rales or rhonchi. Normal respiratory effort.  Psychiatric: Grossly normal with no effect. Speech fluent and appropriate.  Data Reviewed:  No new data   Scheduled Meds: . aspirin EC  325 mg Oral Daily  . diltiazem  30 mg Oral Q6H  . enoxaparin (LOVENOX) injection  40 mg Subcutaneous QHS   Continuous Infusions:   Active Problems:   Atrial fibrillation with rapid ventricular response   Hypokalemia   Elevated blood pressure   Depression

## 2013-02-08 ENCOUNTER — Encounter: Payer: Self-pay | Admitting: Adult Health

## 2013-02-08 ENCOUNTER — Encounter: Payer: Self-pay | Admitting: *Deleted

## 2013-02-08 ENCOUNTER — Ambulatory Visit (INDEPENDENT_AMBULATORY_CARE_PROVIDER_SITE_OTHER): Payer: Medicare FFS | Admitting: Adult Health

## 2013-02-08 VITALS — BP 139/53 | HR 67 | Ht 68.0 in | Wt 193.0 lb

## 2013-02-08 DIAGNOSIS — I4891 Unspecified atrial fibrillation: Secondary | ICD-10-CM

## 2013-02-08 DIAGNOSIS — R079 Chest pain, unspecified: Secondary | ICD-10-CM

## 2013-02-08 MED ORDER — DILTIAZEM HCL ER 60 MG PO CP12: 60.0000 mg | ORAL_CAPSULE | Freq: Two times a day (BID) | ORAL | Status: DC

## 2013-02-08 NOTE — Addendum Note (Signed)
Addended by: Thompson Grayer on: 02/08/2013 02:24 PM   Modules accepted: Orders

## 2013-02-08 NOTE — Patient Instructions (Addendum)
Your physician recommends that you schedule a follow-up appointment in: Post test with Dr Wyline Mood  Your physician has requested that you have a lexiscan myoview. For further information please visit [email or web address] Please follow instruction sheet, as given.  Your physician has recommended you make the following change in your medication:  1. STOP Diltiazem CD 120 mg 2. Start Diltiazem SR 60 mg twice a day

## 2013-02-08 NOTE — Assessment & Plan Note (Signed)
Now in NSR with symptoms of fatigue and some chest discomfort in her chest. She will be scheduled for a lexisan stress cardiolite. We will change diltiazem to 60 mg daily. Dr.Branch has also seen and spoken with this patient in the exam. She is in agreement with this plan., She will follow up with him after testing.

## 2013-02-08 NOTE — Progress Notes (Signed)
    HPI: Lindsay Whitehead is a 67 year old patient of Dr. Wyline Mood we are following post hospitalization after admission for atrial flutter with RVR. She was converted to normal sinus rhythm on diltiazem gtt, she was discharged on full-strength aspirin long-acting diltiazem and she is here for ongoing evaluation. Echocardiogram during hospitalization revealed LVEF of 50-55% with normal wall motion. Indeterminate diastolic function.    She comes today with complaints of fatigue and some chest pain which she didn't feel before. None limiting. She walks 10 acres [place] land.               Allergies  Allergen Reactions  . Bystolic [Nebivolol Hcl] Other (See Comments)    Extreme chest pains/ lowering of blood pressure causing dizziness, falling  . Metoprolol Other (See Comments)    Extreme drop in blood pressure, vertigo resulted  . Lasix [Furosemide] Other (See Comments)    Cramping in thigh area     Current Outpatient Prescriptions  Medication Sig Dispense Refill  . aspirin EC 325 MG EC tablet Take 1 tablet (325 mg total) by mouth daily.      . Calcium-Magnesium-Vitamin D (CALCIUM MAGNESIUM PO) Take 1 tablet by mouth 4 (four) times a week.       . diltiazem (CARDIZEM CD) 120 MG 24 hr capsule Take 1 capsule (120 mg total) by mouth daily.  30 capsule  0  . Flaxseed, Linseed, (FLAX SEEDS PO) Take 1 tablet by mouth daily.      Marland Kitchen LECITHIN PO Take 1 tablet by mouth 4 (four) times a week.      . Multiple Vitamins-Minerals (ZINC PO) Take 1 tablet by mouth 4 (four) times a week.       No current facility-administered medications for this visit.    Past Medical History  Diagnosis Date  . Vertigo   . Hypertension   . Episodic atrial fibrillation     Converted to NSR recent hospitalization 01/2013 on Dilt    Past Surgical History  Procedure Laterality Date  . Wrist fracture surgery      BJY:NWGNFA of systems complete and found to be negative unless listed above  PHYSICAL EXAM BP 139/53   Pulse 67  Ht 5\' 8"  (1.727 m)  Wt 193 lb (87.544 kg)  BMI 29.35 kg/m2  General: Well developed, well nourished, in no acute distress Head: Eyes PERRLA, No xanthomas.   Normal cephalic and atramatic  Lungs: Clear bilaterally to auscultation and percussion. Heart: HRRR S1 S2, without MRG.  Pulses are 2+ & equal.            No carotid bruit. No JVD.  No abdominal bruits. No femoral bruits. Abdomen: Bowel sounds are positive, abdomen soft and non-tender without masses or                  Hernia's noted. Msk:  Back normal, normal gait. Normal strength and tone for age. Extremities: No clubbing, cyanosis or edema.  DP +1 Neuro: Alert and oriented X 3. Psych:  Good affect, responds appropriately   EKG: NSR with PAC's. Rate of 72 bpm.  ASSESSMENT AND PLAN

## 2013-02-08 NOTE — Progress Notes (Deleted)
Name: Lindsay Whitehead    DOB: Aug 14, 1945  Age: 67 y.o.  MR#: [id number]       PCP:  No primary provider on file.      Insurance: Payor: MEDICARE / Plan: MEDICARE PART A AND B / Product Type: *No Product type* /   CC:    Chief Complaint  Patient presents with  . Atrial Flutter    VS Filed Vitals:   02/08/13 1331  BP: 139/53  Pulse: 67  Height: 5\' 8"  (1.727 m)  Weight: 193 lb (87.544 kg)    Weights Current Weight  02/08/13 193 lb (87.544 kg)  01/22/13 184 lb 15.5 oz (83.9 kg)  11/01/12 192 lb 8 oz (87.317 kg)    Blood Pressure  BP Readings from Last 3 Encounters:  02/08/13 139/53  01/22/13 157/80  11/01/12 171/69     Admit date:  (Not on file) Last encounter with RMR:  Visit date not found   Allergy Bystolic; Metoprolol; and Lasix  Current Outpatient Prescriptions  Medication Sig Dispense Refill  . aspirin EC 325 MG EC tablet Take 1 tablet (325 mg total) by mouth daily.      . Calcium-Magnesium-Vitamin D (CALCIUM MAGNESIUM PO) Take 1 tablet by mouth 4 (four) times a week.       . diltiazem (CARDIZEM CD) 120 MG 24 hr capsule Take 1 capsule (120 mg total) by mouth daily.  30 capsule  0  . Flaxseed, Linseed, (FLAX SEEDS PO) Take 1 tablet by mouth daily.      Marland Kitchen LECITHIN PO Take 1 tablet by mouth 4 (four) times a week.      . Multiple Vitamins-Minerals (ZINC PO) Take 1 tablet by mouth 4 (four) times a week.       No current facility-administered medications for this visit.    Discontinued Meds:   There are no discontinued medications.  Patient Active Problem List   Diagnosis Date Noted  . Atrial fibrillation with rapid ventricular response 01/20/2013  . Hypokalemia 01/20/2013  . Elevated blood pressure 01/20/2013  . Depression 01/20/2013    LABS    Component Value Date/Time   NA 141 01/21/2013 0448   NA 142 01/20/2013 1613   K 4.1 01/21/2013 0448   K 3.3* 01/20/2013 1613   CL 105 01/21/2013 0448   CL 101 01/20/2013 1613   CO2 29 01/21/2013 0448   CO2 30  01/20/2013 1613   GLUCOSE 104* 01/21/2013 0448   GLUCOSE 106* 01/20/2013 1613   BUN 9 01/21/2013 0448   BUN 14 01/20/2013 1613   CREATININE 0.70 01/21/2013 0448   CREATININE 0.85 01/20/2013 1613   CALCIUM 9.3 01/21/2013 0448   CALCIUM 9.9 01/20/2013 1613   GFRNONAA 88* 01/21/2013 0448   GFRNONAA 69* 01/20/2013 1613   GFRAA >90 01/21/2013 0448   GFRAA 80* 01/20/2013 1613   CMP     Component Value Date/Time   NA 141 01/21/2013 0448   K 4.1 01/21/2013 0448   CL 105 01/21/2013 0448   CO2 29 01/21/2013 0448   GLUCOSE 104* 01/21/2013 0448   BUN 9 01/21/2013 0448   CREATININE 0.70 01/21/2013 0448   CALCIUM 9.3 01/21/2013 0448   PROT 6.9 01/21/2013 0448   ALBUMIN 3.6 01/21/2013 0448   AST 27 01/21/2013 0448   ALT 26 01/21/2013 0448   ALKPHOS 89 01/21/2013 0448   BILITOT 0.5 01/21/2013 0448   GFRNONAA 88* 01/21/2013 0448   GFRAA >90 01/21/2013 0448       Component Value Date/Time  WBC 3.7* 01/21/2013 0448   WBC 5.6 01/20/2013 1613   HGB 12.0 01/21/2013 0448   HGB 13.8 01/20/2013 1613   HCT 36.8 01/21/2013 0448   HCT 41.8 01/20/2013 1613   MCV 89.5 01/21/2013 0448   MCV 88.9 01/20/2013 1613    Lipid Panel  No results found for this basename: chol, trig, hdl, cholhdl, vldl, ldlcalc    ABG No results found for this basename: phart, pco2, pco2art, po2, po2art, hco3, tco2, acidbasedef, o2sat     Lab Results  Component Value Date   TSH 2.863 01/20/2013   BNP (last 3 results)  Recent Labs  01/20/13 1626  PROBNP 433.4*   Cardiac Panel (last 3 results) No results found for this basename: CKTOTAL, CKMB, TROPONINI, RELINDX,  in the last 72 hours  Iron/TIBC/Ferritin No results found for this basename: iron, tibc, ferritin     EKG Orders placed during the hospital encounter of 01/20/13  . ED EKG  . ED EKG  . EKG 12-LEAD  . EKG 12-LEAD  . EKG 12-LEAD  . EKG 12-LEAD  . EKG 12-LEAD  . EKG 12-LEAD  . EKG 12-LEAD  . EKG 12-LEAD  . EKG 12-LEAD  . EKG 12-LEAD  . EKG 12-LEAD  . EKG  12-LEAD  . EKG     Prior Assessment and Plan Problem List as of 02/08/2013   Atrial fibrillation with rapid ventricular response   Hypokalemia   Elevated blood pressure   Depression       Imaging: Dg Chest Port 1 View  01/20/2013   CLINICAL DATA:  Chest pain  EXAM: PORTABLE CHEST - 1 VIEW  COMPARISON:  None.  FINDINGS: Cardiac leads project over the chest. Heart size appears prominent for portable technique. There is atherosclerotic calcification of the thoracic aortic arch. There is pulmonary vascular congestion. No visible pulmonary edema or focal airspace disease. No pleural effusion is appreciated. Negative for pneumothorax. The trachea is midline. No acute bony abnormality.  IMPRESSION: Probable cardiomegaly.  Pulmonary vascular congestion without edema.   Electronically Signed   By: Britta Mccreedy M.D.   On: 01/20/2013 16:35

## 2013-02-15 ENCOUNTER — Telehealth: Payer: Self-pay | Admitting: *Deleted

## 2013-02-15 NOTE — Telephone Encounter (Signed)
Pt states that she was not able to get the new medication because pharmacy never ordered it. Pt wants to know if we can put her on anpther medication?

## 2013-02-15 NOTE — Telephone Encounter (Signed)
Called to new pharmacy for pt ([facility]) Cardizem SR 60 mg bid They will call pt when med is ready

## 2013-02-18 ENCOUNTER — Encounter (HOSPITAL_COMMUNITY)
Admission: RE | Admit: 2013-02-18 | Discharge: 2013-02-18 | Disposition: A | Discharge status: Home / Self Care | Payer: Medicare FFS | Source: Ambulatory Visit | Attending: Adult Health | Admitting: Adult Health

## 2013-02-18 ENCOUNTER — Encounter (HOSPITAL_COMMUNITY): Payer: Self-pay

## 2013-02-18 DIAGNOSIS — R079 Chest pain, unspecified: Secondary | ICD-10-CM | POA: Insufficient documentation

## 2013-02-18 DIAGNOSIS — I4891 Unspecified atrial fibrillation: Secondary | ICD-10-CM

## 2013-02-18 MED ORDER — TECHNETIUM TC 99M SESTAMIBI - CARDIOLITE
10.0000 | Freq: Once | INTRAVENOUS | Status: AC | PRN
  Administered 2013-02-18: 08:00:00 10 via INTRAVENOUS

## 2013-02-18 MED ORDER — SODIUM CHLORIDE 0.9 % IJ SOLN
INTRAMUSCULAR | Status: AC
  Administered 2013-02-18: 10 mL via INTRAVENOUS
  Filled 2013-02-18: qty 10

## 2013-02-18 MED ORDER — TECHNETIUM TC 99M SESTAMIBI - CARDIOLITE
30.0000 | Freq: Once | INTRAVENOUS | Status: AC | PRN
  Administered 2013-02-18: 30 via INTRAVENOUS

## 2013-02-18 MED ORDER — REGADENOSON 0.4 MG/5ML IV SOLN
INTRAVENOUS | Status: AC
  Administered 2013-02-18: 0.4 mg via INTRAVENOUS
  Filled 2013-02-18: qty 5

## 2013-02-18 NOTE — Progress Notes (Signed)
Stress Lab Nurses Notes - Lindsay Whitehead 02/18/2013 Reason for doing test: Chest Pain and AFib Type of test: Marlane Hatcher Nurse performing test: Parke Poisson, RN Nuclear Medicine Tech: Lyndel Pleasure Echo Tech: Not Applicable MD performing test: Ival Bible /K.Lawrence NP Family MD: NPCP Test explained and consent signed: yes IV started: 22g jelco, Saline lock flushed, No redness or edema and Saline lock started in radiology Symptoms: Dizziness & headache Treatment/Intervention: None Reason test stopped: protocol completed After recovery IV was: Discontinued via X-ray tech and No redness or edema Patient to return to Nuc. Med at : 9:30 Patient discharged: Home Patient's Condition upon discharge was: stable Comments: During test BP 194/86 & HR 79 .  Recovery BP 177/87 & HR 58 . Symptoms resolved in recovery. Erskine Speed T

## 2013-03-04 ENCOUNTER — Telehealth: Payer: Self-pay | Admitting: *Deleted

## 2013-03-04 ENCOUNTER — Telehealth: Payer: Self-pay

## 2013-03-04 NOTE — Telephone Encounter (Signed)
Pt states that she had a problem with gas and indigestion and when she lays down she was in afib. She states that she has not been eaqting solid food because every time she does she has the same problem.

## 2013-03-04 NOTE — Telephone Encounter (Signed)
No new recommendations. Can consider cardiac monitor if this continues. Take meds as directed. Call if HR increases again. May need to adjust meds.

## 2013-03-04 NOTE — Telephone Encounter (Signed)
Pt called c/o bloating and gas when lying supine and ingesting spaghetti .States she had lower GI issues in past but none since she broke her ankle in past ? States HR was elevated to 135 bpm over weekend,took extra cardizem and pt felt better 4 hrs later,HR 85 bpm now.has apt next week in f/u with MD.Saw K.lawrence NP 12/14 and was in NSR.suggested she try OTC nexium,prilosec etc. Suggested she see PCP and states she has none.Offered  her to have nurse visit for EKG and she states she has apt next week.Will try PPI and call back if she decides to want sooner visit with [place].Pt agreed with plan. She will call me back personally.Will forward to K.Lawerence NP    C.Les Pou RN

## 2013-03-12 ENCOUNTER — Encounter: Payer: Self-pay | Admitting: Cardiology

## 2013-03-12 ENCOUNTER — Ambulatory Visit (INDEPENDENT_AMBULATORY_CARE_PROVIDER_SITE_OTHER): Payer: Medicare FFS | Admitting: Cardiology

## 2013-03-12 VITALS — BP 168/75 | HR 61 | Ht 68.0 in | Wt 192.0 lb

## 2013-03-12 DIAGNOSIS — R002 Palpitations: Secondary | ICD-10-CM

## 2013-03-12 DIAGNOSIS — R079 Chest pain, unspecified: Secondary | ICD-10-CM

## 2013-03-12 DIAGNOSIS — I4891 Unspecified atrial fibrillation: Secondary | ICD-10-CM

## 2013-03-12 MED ORDER — RANITIDINE HCL 150 MG PO TABS
150.0000 mg | ORAL_TABLET | Freq: Two times a day (BID) | ORAL | Status: DC
Start: 2013-03-12 — End: 2013-04-23

## 2013-03-12 MED ORDER — DILTIAZEM HCL 90 MG PO TABS: 90.0000 mg | ORAL_TABLET | Freq: Two times a day (BID) | ORAL | Status: DC

## 2013-03-12 NOTE — Patient Instructions (Signed)
Your physician recommends that you schedule a follow-up appointment in: ONE MONTH  Your physician has recommended that you wear an event monitor. Event monitors are medical devices that record the heart's electrical activity. Doctors most often [place] these monitors to diagnose arrhythmias. Arrhythmias are problems with the speed or rhythm of the heartbeat. The monitor is a small, portable device. You can wear one while you do your normal daily activities. This is usually used to diagnose what is causing palpitations/syncope (passing out).FOR SEVEN DAYS, THIS WILL BE MAILED TO YOUR HOME  WE WILL CALL YOU WITH YOUR TEST RESULTS/INSTRUCTIONS/NEXT STEPS ONCE RECEIVED BY THE PROVIDER   Your physician has recommended you make the following change in your medication:   1) START TAKING ZANTAC 150MG  TWICE DAILY 2) INCREASE DILTIAZEM 90MG  TWICE DAILY  Your physician has requested that you regularly monitor and record your blood pressure readings at home. Please use the same machine at the same time of day to check your readings and record them to bring to your follow-up visit.BRING YOUR READINGS BACK WITH YOU AT THE NEXT OFFICE VISIT

## 2013-03-12 NOTE — Progress Notes (Signed)
Clinical Summary [name] is a 68 y.o.female last seen by NP Lindsay Whitehead, this is our first visit together. She is seen for the following medical problems.  1. Afib/Aflutter - recent admission with aflutter with RVR, converted to NSR with IV diltiazem, she has been maintained on oral dilt. -CHADS2Vasc is 3 (though history of HTN unclear), score is 2 without HTN. She has been hesitant to start anticoagulation, currently on ASA only.  - Last Thursday feeling of  feelings of bloating, gas, burping that lasted several hours. She then developed palpitations that lasted several hours into the next day. Notes her HR 133, SBP was 180s on her home monitor.In total symptoms of 8 hours, symptoms resolved just on its own. Has not had repeat episode since that time.  - reports compliant with medications   2. Chest pain - describes intermittent chest pain, in mid chest that radiates down both arms. - last visit referred for MPI which showed no ischemia. Pain can be associated with palpitations at times.     Past Medical History  Diagnosis Date  . Vertigo   . Hypertension   . Episodic atrial fibrillation     Converted to NSR recent hospitalization 01/2013 on Dilt     Allergies  Allergen Reactions  . Bystolic [Nebivolol Hcl] Other (See Comments)    Extreme chest pains/ lowering of blood pressure causing dizziness, falling  . Metoprolol Other (See Comments)    Extreme drop in blood pressure, vertigo resulted  . Lasix [Furosemide] Other (See Comments)    Cramping in thigh area      Current Outpatient Prescriptions  Medication Sig Dispense Refill  . aspirin EC 325 MG EC tablet Take 1 tablet (325 mg total) by mouth daily.      . Calcium-Magnesium-Vitamin D (CALCIUM MAGNESIUM PO) Take 1 tablet by mouth 4 (four) times a week.       . diltiazem (CARDIZEM SR) 60 MG 12 hr capsule Take 1 capsule (60 mg total) by mouth 2 (two) times daily.  180 capsule  4  . Flaxseed, Linseed, (FLAX SEEDS  PO) Take 1 tablet by mouth daily.      Marland Kitchen LECITHIN PO Take 1 tablet by mouth 4 (four) times a week.      . Multiple Vitamins-Minerals (ZINC PO) Take 1 tablet by mouth 4 (four) times a week.       No current facility-administered medications for this visit.     Past Surgical History  Procedure Laterality Date  . Wrist fracture surgery       Allergies  Allergen Reactions  . Bystolic [Nebivolol Hcl] Other (See Comments)    Extreme chest pains/ lowering of blood pressure causing dizziness, falling  . Metoprolol Other (See Comments)    Extreme drop in blood pressure, vertigo resulted  . Lasix [Furosemide] Other (See Comments)    Cramping in thigh area       Family History  Problem Relation Age of Onset  . Cancer Other   . Cardiomyopathy Brother     Congenital with need for heart transplant. Died at 67.     Social History [name] reports that she has never smoked. She has never used smokeless tobacco. [name] reports that she drinks alcohol.   Review of Systems CONSTITUTIONAL: No weight loss, fever, chills, weakness or fatigue.  HEENT: Eyes: No visual loss, blurred vision, double vision or yellow sclerae.No hearing loss, sneezing, congestion, runny nose or sore throat.  SKIN: No rash or  itching.  CARDIOVASCULAR: per HPI RESPIRATORY: No shortness of breath, cough or sputum.  GASTROINTESTINAL: No anorexia, nausea, vomiting or diarrhea. No abdominal pain or blood.  GENITOURINARY: No burning on urination, no polyuria NEUROLOGICAL: No headache, dizziness, syncope, paralysis, ataxia, numbness or tingling in the extremities. No change in bowel or bladder control.  MUSCULOSKELETAL: No muscle, back pain, joint pain or stiffness.  LYMPHATICS: No enlarged nodes. No history of splenectomy.  PSYCHIATRIC: No history of depression or anxiety.  ENDOCRINOLOGIC: No reports of sweating, cold or heat intolerance. No polyuria or polydipsia.  .   Physical Examination p 61  bp 168Marland Kitchen/75 Wt 192 lbs BMI 29 Gen: resting comfortably, no acute distress HEENT: no scleral icterus, pupils equal round and reactive, no palptable cervical adenopathy,  CV: RRR, no m/r/g, no JVD, no carotid bruits Resp: Clear to auscultation bilaterally GI: abdomen is soft, non-tender, non-distended, normal bowel sounds, no hepatosplenomegaly MSK: extremities are warm, no edema.  Skin: warm, no rash Neuro:  no focal deficits Psych: appropriate affect   Diagnostic Studies 01/2013 Echo LVEF 50-55%, no WMAs, indeterminate diastolic function with increased LA pressure  01/2013 MPI Low risk Lexiscan Cardiolite as outlined. There were no diagnostic ST segment abnormalities over nonspecific changes at baseline. Sinus rhythm was noted throughout the study. Perfusion imaging is most consistent with soft tissue attenuation affecting the anterior wall, no definite scar or ischemia. LVEF 52% with increased LV volumes    Assessment and Plan  1. Afib - continued symptoms on current therapy, will increase diltiazem to 90mg  bid. Counseled that given her baseline HR she may experience side effects of lightheadedness/dizziness, and if so to contact our office so that we can increase dose - if not improved or cannot tolerate increased rate control, will consider referral to EP for assistance with possible rhythm control strategy - she remains on ASA, we need to have further talks about anticoagulation however. Will readdress at next visit.  2. Chest pain - atypical chest pain, negative stress MPI - I have started her on zantac 150mg  bid as she has been having significant heart burn symptoms as well - chest pain can be associated with palpitaitons will order a heart monitor to see if coincides with afib.   Follow up 1 month      Antoine PocheJonathan F. Jodiann Ognibene, M.D., F.A.C.C.

## 2013-03-13 MED ORDER — DILTIAZEM HCL ER 90 MG PO CP12: 90.0000 mg | ORAL_CAPSULE | Freq: Two times a day (BID) | ORAL | Status: DC

## 2013-03-13 NOTE — Telephone Encounter (Signed)
Starting that dose tomorrow is fine.   Dina Rich MD

## 2013-03-13 NOTE — Telephone Encounter (Signed)
Concern addressed at OV with Dr Wyline Mood yesterday 03-12-13, pt pharmacy rep Lorin Picket from [facility] [phone] called to advise the increase for the pt to take slow acting diltizem 90mg  twice daily however they are out of stock until tomorrow, they only have 90mg  IR, 120 ER and 60 ER in stock, advised Dr Wyline Mood is in the [place] office today and we will have to wait for an answer on the next steps, Lorin Picket advised he will inform the pt however notes she is very nervous about not having what the MD wanted her to start on, pending answer from Dr Wyline Mood at this time

## 2013-03-13 NOTE — Telephone Encounter (Signed)
Spoke to pt husband per notes he is on the road and unable to contact pt nor has another number to contact her by, advised Dr Wyline Mood instructions, however pt husband was under the impression the hold up was from our office not the issue of the medication not being in stock, this nurse advised the RX was sent in yesterday and again ordered verbally via Lorin Picket for short acting 90mg   ER SR Bid #60 with 6 refills, pt husband advised that he will try to contact the pt with the update, this nurse then called [facility] and spoke to pharmacy manager Vance Gather to clarify, she noted that she did not have the verbal that this nurse gave to [place] earlier however she will order now so the order will go in today and be in by tomorrow, this nurse will not be in office tomorrow to update with pt, informed HD LPN about concern

## 2013-03-14 ENCOUNTER — Telehealth: Payer: Self-pay | Admitting: *Deleted

## 2013-03-14 NOTE — Telephone Encounter (Signed)
Pt is too pick up medication today.

## 2013-03-14 NOTE — Telephone Encounter (Signed)
Pt states that she has been to pharmacy four time to get two RX. They states they are waiting on [place]. Please call [facility] and she what they need we already sent both through epic to them.

## 2013-03-14 NOTE — Telephone Encounter (Signed)
Called [facility] stated one of the medications was ready and the other is out of stock and waiting for it to come in. Left message for the pt.

## 2013-03-15 ENCOUNTER — Ambulatory Visit: Payer: Medicare Other | Admitting: Cardiology

## 2013-04-23 ENCOUNTER — Encounter: Payer: Self-pay | Admitting: Cardiology

## 2013-04-23 ENCOUNTER — Ambulatory Visit (INDEPENDENT_AMBULATORY_CARE_PROVIDER_SITE_OTHER): Payer: Medicare FFS | Admitting: Cardiology

## 2013-04-23 VITALS — BP 152/71 | HR 62 | Ht 68.0 in | Wt 196.0 lb

## 2013-04-23 DIAGNOSIS — I4891 Unspecified atrial fibrillation: Secondary | ICD-10-CM

## 2013-04-23 NOTE — Patient Instructions (Addendum)
Your physician wants you to follow-up in: 6 months You will receive a reminder letter in the mail two months in advance. If you don't receive a letter, please call our office to schedule the follow-up appointment.     Your physician recommends that you continue on your current medications as directed. Please refer to the Current Medication list given to you today.      Thank you for choosing [facility] !        

## 2013-04-23 NOTE — Progress Notes (Signed)
Clinical Summary Ms. Reisig is a 68 y.o.female seen today for follow up of the following medical problems.   1. Afib/Aflutter  - recent admission with aflutter with RVR, converted to NSR with IV diltiazem, she has been maintained on oral dilt.  -CHADS2Vasc is 3 (though history of HTN unclear), score is 2 without HTN. She has been hesitant to start anticoagulation, currently on ASA only.  - last visit in setting of continued symptomatic palpitations increased her dilt to 90mg  bid, this has made no change in her continued symptoms.    Past Medical History  Diagnosis Date  . Vertigo   . Hypertension   . Episodic atrial fibrillation     Converted to NSR recent hospitalization 01/2013 on Dilt     Allergies  Allergen Reactions  . Bystolic [Nebivolol Hcl] Other (See Comments)    Extreme chest pains/ lowering of blood pressure causing dizziness, falling  . Metoprolol Other (See Comments)    Extreme drop in blood pressure, vertigo resulted  . Lasix [Furosemide] Other (See Comments)    Cramping in thigh area      Current Outpatient Prescriptions  Medication Sig Dispense Refill  . aspirin EC 325 MG EC tablet Take 1 tablet (325 mg total) by mouth daily.      . Calcium-Magnesium-Vitamin D (CALCIUM MAGNESIUM PO) Take 1 tablet by mouth 4 (four) times a week.       . diltiazem (CARDIZEM SR) 90 MG 12 hr capsule Take 1 capsule (90 mg total) by mouth 2 (two) times daily.  60 capsule  6  . Flaxseed, Linseed, (FLAX SEEDS PO) Take 1 tablet by mouth daily.      Marland Kitchen. LECITHIN PO Take 1 tablet by mouth 4 (four) times a week.      . Multiple Vitamins-Minerals (ZINC PO) Take 1 tablet by mouth 4 (four) times a week.      . ranitidine (ZANTAC) 150 MG tablet Take 1 tablet (150 mg total) by mouth 2 (two) times daily.  60 tablet  6   No current facility-administered medications for this visit.     Past Surgical History  Procedure Laterality Date  . Wrist fracture surgery       Allergies    Allergen Reactions  . Bystolic [Nebivolol Hcl] Other (See Comments)    Extreme chest pains/ lowering of blood pressure causing dizziness, falling  . Metoprolol Other (See Comments)    Extreme drop in blood pressure, vertigo resulted  . Lasix [Furosemide] Other (See Comments)    Cramping in thigh area       Family History  Problem Relation Age of Onset  . Cancer Other   . Cardiomyopathy Brother     Congenital with need for heart transplant. Died at 4047.     Social History Ms. Strum reports that she has never smoked. She has never used smokeless tobacco. Ms. Merla RichesMechachonis reports that she drinks alcohol.   Review of Systems CONSTITUTIONAL: No weight loss, fever, chills, weakness or fatigue.  HEENT: Eyes: No visual loss, blurred vision, double vision or yellow sclerae.No hearing loss, sneezing, congestion, runny nose or sore throat.  SKIN: No rash or itching.  CARDIOVASCULAR: per HPI RESPIRATORY: No shortness of breath, cough or sputum.  GASTROINTESTINAL: No anorexia, nausea, vomiting or diarrhea. No abdominal pain or blood.  GENITOURINARY: No burning on urination, no polyuria NEUROLOGICAL: No headache, dizziness, syncope, paralysis, ataxia, numbness or tingling in the extremities. No change in bowel or bladder control.  MUSCULOSKELETAL: No  muscle, back pain, joint pain or stiffness.  LYMPHATICS: No enlarged nodes. No history of splenectomy.  PSYCHIATRIC: No history of depression or anxiety.  ENDOCRINOLOGIC: No reports of sweating, cold or heat intolerance. No polyuria or polydipsia.  Marland Kitchen   Physical Examination p 62 bp 152/71 Wt 196 lbs BMI 30 Gen: resting comfortably, no acute distress HEENT: no scleral icterus, pupils equal round and reactive, no palptable cervical adenopathy,  CV: RRR, no m/r/g,no JVD, carotid bruits Resp: Clear to auscultation bilaterally GI: abdomen is soft, non-tender, non-distended, normal bowel sounds, no hepatosplenomegaly MSK: extremities are  warm, no edema.  Skin: warm, no rash Neuro:  no focal deficits Psych: appropriate affect   Diagnostic Studies 01/2013 Echo  LVEF 50-55%, no WMAs, indeterminate diastolic function with increased LA pressure   01/2013 MPI  Low risk Lexiscan Cardiolite as outlined. There were no diagnostic ST segment abnormalities over nonspecific changes at baseline. Sinus rhythm was noted throughout the study. Perfusion imaging is most consistent with soft tissue attenuation affecting the anterior wall, no definite scar or ischemia. LVEF 52% with increased LV volumes     Assessment and Plan   1. Afib  - continued symptoms on rate control strategy, baseline HRs low 60s, will not titrate dilt further at this time - will refer to EP clinic for consideration for rhythm control strategy - Her CHADS2Vasc score is 3, she continues to be resistant to anticoagulation at this time. Risks and benefits readdressed this appointment, will continue ASA for now       Antoine Poche, M.D., F.A.C.C.

## 2013-05-31 ENCOUNTER — Ambulatory Visit (INDEPENDENT_AMBULATORY_CARE_PROVIDER_SITE_OTHER): Payer: Medicare FFS | Admitting: Internal Medicine

## 2013-05-31 ENCOUNTER — Encounter: Payer: Self-pay | Admitting: Internal Medicine

## 2013-05-31 VITALS — BP 132/80 | HR 60 | Ht 68.0 in | Wt 198.1 lb

## 2013-05-31 DIAGNOSIS — I4892 Unspecified atrial flutter: Secondary | ICD-10-CM

## 2013-05-31 NOTE — Patient Instructions (Signed)
Your physician recommends that you schedule a follow-up appointment in: In July with [name]  Your physician recommends that you continue on your current medications as directed. Please refer to the Current Medication list given to you today.

## 2013-06-03 ENCOUNTER — Encounter: Payer: Self-pay | Admitting: Internal Medicine

## 2013-06-03 NOTE — Progress Notes (Signed)
HPI Lindsay Whitehead is referred today for evaluation of paroxysmal atrial flutter. The patient is a very pleasant 68 yo woman with several episodes of symptomatic atrial flutter associated with an RVR. Her chart would suggest that she also has atrial fibrillation but I have reviewed all of her ECG's and only see atrial flutter. The episodes start and stop suddenly and are associated with sob. She has been placed on diltiazem and has refused anti-coagulation with a CHADSVASC score of 2. The patient has not had syncope. She is unhappy about her atrial flutter but not inclined to consider ablation at this point in time. She denies chest pain or syncope but is frustrated by a gradual and progressive weight gain. She also has dyspnea with exertion. Allergies  Allergen Reactions  . Bystolic [Nebivolol Hcl] Other (See Comments)    Extreme chest pains/ lowering of blood pressure causing dizziness, falling  . Metoprolol Other (See Comments)    Extreme drop in blood pressure, vertigo resulted  . Lasix [Furosemide] Other (See Comments)    Cramping in thigh area   . Zantac [Ranitidine Hcl]      Current Outpatient Prescriptions  Medication Sig Dispense Refill  . aspirin 81 MG tablet Take 81 mg by mouth daily.      . Calcium-Magnesium-Vitamin D (CALCIUM MAGNESIUM PO) Take 1 tablet by mouth once a week.       . diltiazem (CARDIZEM) 60 MG tablet Take 60 mg by mouth 2 (two) times daily.      Marland Kitchen. esomeprazole (NEXIUM) 40 MG capsule Take 40 mg by mouth daily at 12 noon.      . Biotin (BIOTIN 5000) 5 MG CAPS Take 5,000 mg by mouth 3 (three) times a week.      . Flaxseed, Linseed, (FLAX SEEDS PO) Take 1 tablet by mouth 3 (three) times a week.       Marland Kitchen. LECITHIN PO Take 1 tablet by mouth 3 (three) times a week.       . Multiple Vitamins-Minerals (ZINC PO) Take 1 tablet by mouth 3 (three) times a week.        No current facility-administered medications for this visit.     Past Medical History    Diagnosis Date  . Vertigo   . Hypertension   . Episodic atrial fibrillation     Converted to NSR recent hospitalization 01/2013 on Dilt    ROS:   All systems reviewed and negative except as noted in the HPI.   Past Surgical History  Procedure Laterality Date  . Wrist fracture surgery       Family History  Problem Relation Age of Onset  . Cancer Other   . Cardiomyopathy Brother     Congenital with need for heart transplant. Died at 1447.     History   Social History  . Marital Status: Married    Spouse Name: N/A    Number of Children: N/A  . Years of Education: N/A   Occupational History  . Not on file.   Social History Main Topics  . Smoking status: Never Smoker   . Smokeless tobacco: Never Used  . Alcohol Use: 0.0 oz/week    4-5 drink(s) per week  . Drug Use: No  . Sexual Activity: Not on file   Other Topics Concern  . Not on file   Social History Narrative  . No narrative on file     BP 132/80  Pulse 60  Ht 5\' 8"  (1.727  m)  Wt 198 lb 1.9 oz (89.867 kg)  BMI 30.13 kg/m2  SpO2 99%  Physical Exam:  Well appearing 68 yo woman, NAD HEENT: Unremarkable Neck:  7 cm JVD, no thyromegally Back:  No CVA tenderness Lungs:  Clear with no wheezes. HEART:  Regular rate rhythm, no murmurs, no rubs, no clicks Abd:  soft, positive bowel sounds, no organomegally, no rebound, no guarding Ext:  2 plus pulses, no edema, no cyanosis, no clubbing Skin:  No rashes no nodules Neuro:  CN II through XII intact, motor grossly intact  EKG - nsr  Assess/Plan:

## 2013-06-03 NOTE — Assessment & Plan Note (Signed)
I have reviewed her ECG's and have discussed the risks/benefits/goals/expectations of catheter ablation of atrial flutter. She is considering her options and at this time is not inclined to proceed with ablation. In addition, she refuses anti-coagulation. I discussed why blood thinner were indicated, specifically that she was at risk for a stroke, but at this time she has refused.

## 2013-07-19 ENCOUNTER — Encounter: Payer: Self-pay | Admitting: Adult Health

## 2013-08-25 ENCOUNTER — Encounter (HOSPITAL_COMMUNITY): Payer: Self-pay | Admitting: Emergency Medicine

## 2013-08-25 ENCOUNTER — Emergency Department (HOSPITAL_COMMUNITY)
Admission: EM | Admit: 2013-08-25 | Discharge: 2013-08-25 | Disposition: A | Discharge status: Home / Self Care | Payer: Medicare FFS | Attending: Emergency Medicine | Admitting: Emergency Medicine

## 2013-08-25 DIAGNOSIS — L309 Dermatitis, unspecified: Secondary | ICD-10-CM

## 2013-08-25 DIAGNOSIS — Z7982 Long term (current) use of aspirin: Secondary | ICD-10-CM | POA: Insufficient documentation

## 2013-08-25 DIAGNOSIS — I10 Essential (primary) hypertension: Secondary | ICD-10-CM | POA: Insufficient documentation

## 2013-08-25 DIAGNOSIS — Z79899 Other long term (current) drug therapy: Secondary | ICD-10-CM | POA: Insufficient documentation

## 2013-08-25 DIAGNOSIS — R61 Generalized hyperhidrosis: Secondary | ICD-10-CM | POA: Insufficient documentation

## 2013-08-25 DIAGNOSIS — L259 Unspecified contact dermatitis, unspecified cause: Secondary | ICD-10-CM | POA: Insufficient documentation

## 2013-08-25 DIAGNOSIS — R062 Wheezing: Secondary | ICD-10-CM | POA: Insufficient documentation

## 2013-08-25 DIAGNOSIS — I4891 Unspecified atrial fibrillation: Secondary | ICD-10-CM | POA: Insufficient documentation

## 2013-08-25 MED ORDER — PREDNISONE 10 MG PO TABS: 20.0000 mg | ORAL_TABLET | Freq: Two times a day (BID) | ORAL | Status: DC

## 2013-08-25 MED ORDER — LORATADINE 10 MG PO TABS: 10.0000 mg | ORAL_TABLET | Freq: Every day | ORAL | Status: DC

## 2013-08-25 NOTE — ED Notes (Signed)
Pt states that she has been having problems with rash, blisters to gums since taking her blood pressure medication in may, had medication change and vitamin c with improvement in symptoms but pt states that the symptoms are starting to come back,

## 2013-08-25 NOTE — Discharge Instructions (Signed)
You can take benadryl at night if needed for itching.

## 2013-08-25 NOTE — ED Provider Notes (Signed)
CSN: [id number]     Arrival date & time 08/25/13  1114 History   First MD Initiated Contact with Patient 08/25/13 1141     Chief Complaint  Patient presents with  . Rash   Patient is a 68 y.o. female presenting with rash. The history is provided by the patient. No language interpreter was used.  Rash Associated symptoms: wheezing   Associated symptoms: no sore throat    This chart was scribed for nurse practitioner working with Hurman HornJohn M Bednar, MD, by Andrew Auaven Small, ED Scribe. This patient was seen in room APFT24/APFT24 and the patient's care was started at 11:44 AM.  Lindsay GaskinsJanice Whitehead is a 68 y.o. female who presents to the Emergency Department complaining of an itchy, erythematous rash. She reports rash is located to lower abdomen and beginning to spread to right flank . Pt has taken a bath in tincture green soap with mild relief. Pt denies taking OTC medications for the rash. She reports 3 hours after taking  BP medication the rash begins to itch.  Pt denies new detergents, soaps, clothes and under garments. Pt has been putting vinegar in her clothes for a fresher scent. Pt denies being outside.Pt denies trouble swallowing. Pt denies DM  Pt also c/o erythematous gums with blisters. Pt has had this problem in the past and was prescribed 4000mg  of vitamins C with relief to blisters and redness.She reports she is down to 1000mg  of vitamin C and her gums are now receeding. Pt takes edarbi for HTN.        Past Medical History  Diagnosis Date  . Vertigo   . Hypertension   . Episodic atrial fibrillation     Converted to NSR recent hospitalization 01/2013 on Dilt   Past Surgical History  Procedure Laterality Date  . Wrist fracture surgery     Family History  Problem Relation Age of Onset  . Cancer Other   . Cardiomyopathy Brother     Congenital with need for heart transplant. Died at 4147.   History  Substance Use Topics  . Smoking status: Never Smoker   . Smokeless tobacco: Never Used  .  Alcohol Use: 0.0 oz/week    4-5 drink(s) per week   OB History   Grav Para Term Preterm Abortions TAB SAB Ect Mult Living   3 3 3       2      Review of Systems  Constitutional: Positive for diaphoresis.  HENT: Negative for sore throat and trouble swallowing.   Respiratory: Positive for wheezing.   Skin: Positive for rash.  all other systems negative.   Allergies  Bystolic; Metoprolol; Lasix; and Zantac  Home Medications   Prior to Admission medications   Medication Sig Start Date End Date Taking? Authorizing Provider  aspirin 81 MG tablet Take 81 mg by mouth daily.    Historical Provider, MD  Biotin (BIOTIN 5000) 5 MG CAPS Take 5,000 mg by mouth 3 (three) times a week.    Historical Provider, MD  Calcium-Magnesium-Vitamin D (CALCIUM MAGNESIUM PO) Take 1 tablet by mouth once a week.     Historical Provider, MD  diltiazem (CARDIZEM) 60 MG tablet Take 60 mg by mouth 2 (two) times daily.    Historical Provider, MD  esomeprazole (NEXIUM) 40 MG capsule Take 40 mg by mouth daily at 12 noon.    Historical Provider, MD  Flaxseed, Linseed, (FLAX SEEDS PO) Take 1 tablet by mouth 3 (three) times a week.     Historical Provider,  MD  LECITHIN PO Take 1 tablet by mouth 3 (three) times a week.     Historical Provider, MD  Multiple Vitamins-Minerals (ZINC PO) Take 1 tablet by mouth 3 (three) times a week.     Historical Provider, MD   Triage Vitals- BP 160/65  Pulse 64  Temp(Src) 98.3 F (36.8 C) (Oral)  Resp 20  Ht 5\' 8"  (1.727 m)  Wt 201 lb (91.173 kg)  BMI 30.57 kg/m2  SpO2 100% Physical Exam  Nursing note and vitals reviewed. Constitutional: She is oriented to person, place, and time. She appears well-developed and well-nourished. No distress.  HENT:  Head: Normocephalic and atraumatic.  Right Ear: Hearing, tympanic membrane, external ear and ear canal normal.  Left Ear: Hearing, tympanic membrane, external ear and ear canal normal.  Mouth/Throat: Oropharynx is clear and moist.   Eyes: Conjunctivae and EOM are normal. Pupils are equal, round, and reactive to light.  Neck: Neck supple.  Cardiovascular: Normal rate and regular rhythm.   Pulmonary/Chest: Effort normal. No respiratory distress. She has wheezes. She has no rales. She exhibits no tenderness.  Abdominal:  There is a macular papular rash noted to the lower abdomen. Patient has been scratching the areas and it is irritated.    Musculoskeletal: Normal range of motion.  Neurological: She is alert and oriented to person, place, and time.  Skin: Skin is warm and dry.  Psychiatric: She has a normal mood and affect. Her behavior is normal.    ED Course: Dr. Fonnie Jarvis in to examine the patient and discuss plan of care.   Procedures  12:06 PM-Discussed treatment plan which includes stopping edarbi. Pt will begin taking prednisone, Claritin and benadryl at night.   MDM  68 y.o. female with rash and itching to the lower abdomen which she thinks is related her her medication. Stable for discharge without shortness of breath, difficulty swallowing or swelling of throat. Discussed with the patient and all questioned fully answered. She will return if any problems arise.  Final diagnoses:  None   Meds ordered this encounter  Medications  . predniSONE (DELTASONE) 10 MG tablet    Sig: Take 2 tablets (20 mg total) by mouth 2 (two) times daily with a meal.    Dispense:  16 tablet    Refill:  0    Order Specific Question:  Supervising Provider    Answer:  Hurman Horn [[id number]]  . loratadine (CLARITIN) 10 MG tablet    Sig: Take 1 tablet (10 mg total) by mouth daily.    Dispense:  14 tablet    Refill:  0    Order Specific Question:  Supervising Provider    Answer:  Hurman Horn [[id number]]    I personally performed the services described in this documentation, which was scribed in my presence. The recorded information has been reviewed and is accurate.      [facility] Orlene Och, NP 09/05/13 1700

## 2013-08-26 NOTE — ED Provider Notes (Signed)
Medical screening examination/treatment/procedure(s) were performed by non-physician practitioner and as supervising physician I was immediately available for consultation/collaboration.   EKG Interpretation None       Hurman Horn, MD 08/26/13 1253

## 2013-12-23 ENCOUNTER — Encounter (HOSPITAL_COMMUNITY): Payer: Self-pay | Admitting: Emergency Medicine

## 2014-07-18 ENCOUNTER — Inpatient Hospital Stay (HOSPITAL_COMMUNITY)
Admission: EM | Admit: 2014-07-18 | Discharge: 2014-07-19 | DRG: 310 | Disposition: A | Discharge status: Home / Self Care | Payer: Medicare HMO | Attending: Internal Medicine | Admitting: Internal Medicine

## 2014-07-18 ENCOUNTER — Encounter (HOSPITAL_COMMUNITY): Payer: Self-pay | Admitting: *Deleted

## 2014-07-18 DIAGNOSIS — I480 Paroxysmal atrial fibrillation: Principal | ICD-10-CM | POA: Diagnosis present

## 2014-07-18 DIAGNOSIS — R000 Tachycardia, unspecified: Secondary | ICD-10-CM | POA: Diagnosis present

## 2014-07-18 DIAGNOSIS — I4510 Unspecified right bundle-branch block: Secondary | ICD-10-CM | POA: Diagnosis present

## 2014-07-18 DIAGNOSIS — I4891 Unspecified atrial fibrillation: Secondary | ICD-10-CM | POA: Diagnosis present

## 2014-07-18 DIAGNOSIS — K219 Gastro-esophageal reflux disease without esophagitis: Secondary | ICD-10-CM | POA: Diagnosis present

## 2014-07-18 DIAGNOSIS — I10 Essential (primary) hypertension: Secondary | ICD-10-CM | POA: Diagnosis present

## 2014-07-18 DIAGNOSIS — I4892 Unspecified atrial flutter: Secondary | ICD-10-CM | POA: Diagnosis present

## 2014-07-18 HISTORY — DX: Unspecified right bundle-branch block: I45.10

## 2014-07-18 HISTORY — DX: Unspecified atrial flutter: I48.92

## 2014-07-18 LAB — COMPREHENSIVE METABOLIC PANEL
ALBUMIN: 4.2 g/dL (ref 3.5–5.0)
ALK PHOS: 84 U/L (ref 38–126)
ALT: 23 U/L (ref 14–54)
AST: 29 U/L (ref 15–41)
Anion gap: 8 (ref 5–15)
BILIRUBIN TOTAL: 0.7 mg/dL (ref 0.3–1.2)
BUN: 12 mg/dL (ref 6–20)
CALCIUM: 8.8 mg/dL — AB (ref 8.9–10.3)
CO2: 27 mmol/L (ref 22–32)
Chloride: 105 mmol/L (ref 101–111)
Creatinine, Ser: 0.81 mg/dL (ref 0.44–1.00)
GFR calc Af Amer: >60 mL/min (ref >60)
GFR calc non Af Amer: >60 mL/min (ref >60)
Glucose, Bld: 93 mg/dL (ref 65–99)
Potassium: 3.9 mmol/L (ref 3.5–5.1)
Sodium: 140 mmol/L (ref 135–145)
TOTAL PROTEIN: 7.8 g/dL (ref 6.5–8.1)

## 2014-07-18 LAB — URINE MICROSCOPIC-ADD ON

## 2014-07-18 LAB — URINALYSIS, ROUTINE W REFLEX MICROSCOPIC
Bilirubin Urine: NEGATIVE
Glucose, UA: NEGATIVE mg/dL
Hgb urine dipstick: NEGATIVE
KETONES UR: NEGATIVE mg/dL
Leukocytes, UA: NEGATIVE
NITRITE: NEGATIVE
Specific Gravity, Urine: >1.03 — ABNORMAL HIGH (ref 1.005–1.030)
UROBILINOGEN UA: 0.2 mg/dL (ref 0.0–1.0)
pH: 5.5 (ref 5.0–8.0)

## 2014-07-18 LAB — CBC WITH DIFFERENTIAL/PLATELET
BASOS PCT: 1 % (ref 0–1)
Basophils Absolute: 0 10*3/uL (ref 0.0–0.1)
EOS ABS: 0.2 10*3/uL (ref 0.0–0.7)
Eosinophils Relative: 3 % (ref 0–5)
HEMATOCRIT: 41.2 % (ref 36.0–46.0)
Hemoglobin: 13.2 g/dL (ref 12.0–15.0)
LYMPHS ABS: 1.7 10*3/uL (ref 0.7–4.0)
Lymphocytes Relative: 28 % (ref 12–46)
MCH: 28.2 pg (ref 26.0–34.0)
MCHC: 32 g/dL (ref 30.0–36.0)
MCV: 88 fL (ref 78.0–100.0)
MONO ABS: 0.5 10*3/uL (ref 0.1–1.0)
MONOS PCT: 9 % (ref 3–12)
NEUTROS ABS: 3.5 10*3/uL (ref 1.7–7.7)
NEUTROS PCT: 59 % (ref 43–77)
PLATELETS: 241 10*3/uL (ref 150–400)
RBC: 4.68 MIL/uL (ref 3.87–5.11)
RDW: 13.8 % (ref 11.5–15.5)
WBC: 5.9 10*3/uL (ref 4.0–10.5)

## 2014-07-18 LAB — I-STAT CG4 LACTIC ACID, ED: Lactic Acid, Venous: 1.27 mmol/L (ref 0.5–2.0)

## 2014-07-18 LAB — TSH: TSH: 1.439 u[IU]/mL (ref 0.350–4.500)

## 2014-07-18 LAB — TROPONIN I: Troponin I: <0.03 ng/mL (ref <0.031)

## 2014-07-18 MED ORDER — PANTOPRAZOLE SODIUM 40 MG PO TBEC: 80.0000 mg | DELAYED_RELEASE_TABLET | Freq: Every day | ORAL | Status: DC

## 2014-07-18 MED ORDER — DILTIAZEM HCL ER COATED BEADS 180 MG PO CP24
180.0000 mg | ORAL_CAPSULE | Freq: Every day | ORAL | Status: DC
Start: 2014-07-19 — End: 2014-07-18

## 2014-07-18 MED ORDER — DILTIAZEM HCL ER 90 MG PO CP12
90.0000 mg | ORAL_CAPSULE | Freq: Once | ORAL | Status: DC
  Filled 2014-07-18: qty 1

## 2014-07-18 MED ORDER — DILTIAZEM LOAD VIA INFUSION
10.0000 mg | Freq: Once | INTRAVENOUS | Status: AC
  Administered 2014-07-18: 10 mg via INTRAVENOUS
  Filled 2014-07-18: qty 10

## 2014-07-18 MED ORDER — DILTIAZEM HCL ER 90 MG PO CP12
90.0000 mg | ORAL_CAPSULE | Freq: Two times a day (BID) | ORAL | Status: DC
  Filled 2014-07-18 (×6): qty 1

## 2014-07-18 MED ORDER — DILTIAZEM HCL 30 MG PO TABS: 30.0000 mg | ORAL_TABLET | Freq: Four times a day (QID) | ORAL | Status: DC | PRN

## 2014-07-18 MED ORDER — ASPIRIN 300 MG RE SUPP
300.0000 mg | RECTAL | Status: AC
  Filled 2014-07-18: qty 1

## 2014-07-18 MED ORDER — ONDANSETRON HCL 4 MG/2ML IJ SOLN: 4.0000 mg | Freq: Four times a day (QID) | INTRAMUSCULAR | Status: DC | PRN

## 2014-07-18 MED ORDER — SODIUM CHLORIDE 0.9 % IV SOLN: 250.0000 mL | INTRAVENOUS | Status: DC | PRN

## 2014-07-18 MED ORDER — ENOXAPARIN SODIUM 40 MG/0.4ML ~~LOC~~ SOLN
40.0000 mg | SUBCUTANEOUS | Status: DC
  Administered 2014-07-18: 40 mg via SUBCUTANEOUS
  Filled 2014-07-18: qty 0.4

## 2014-07-18 MED ORDER — ASPIRIN 81 MG PO CHEW
324.0000 mg | CHEWABLE_TABLET | ORAL | Status: AC
Start: 2014-07-18 — End: 2014-07-18
  Administered 2014-07-18: 324 mg via ORAL
  Filled 2014-07-18: qty 4

## 2014-07-18 MED ORDER — SODIUM CHLORIDE 0.9 % IV BOLUS (SEPSIS)
1000.0000 mL | Freq: Once | INTRAVENOUS | Status: AC
  Administered 2014-07-18: 1000 mL via INTRAVENOUS

## 2014-07-18 MED ORDER — DILTIAZEM HCL 100 MG IV SOLR
5.0000 mg/h | INTRAVENOUS | Status: DC
  Administered 2014-07-18: 5 mg/h via INTRAVENOUS
  Filled 2014-07-18: qty 100

## 2014-07-18 NOTE — ED Notes (Signed)
Pt states she is having trouble urinating, has hx of UTI and urinary hesitancy.

## 2014-07-18 NOTE — Progress Notes (Signed)
Paged Dr. Adrian Blackwater about Cardizem one-dose medication tonight with patient still having a heart rate from bradycardic 55 to 60's at times.  States it is okay to give medication at this time.

## 2014-07-18 NOTE — H&P (Signed)
History and Physical  Lindsay Whitehead ZDG:[id number] DOB: 1945/08/06 DOA: 07/18/2014  Referring physician: Dr Elesa Massed, ED physician PCP: No PCP Per Patient   Chief Complaint: Urinary frequency  HPI: Lindsay Whitehead is a 69 y.o. female  With a history of hypertension, paroxysmal atrial fibrillation/atrial flutter has been controlled on diltiazem in the past and not currently on anticoagulation. The patient presented to the emergency department due to urinary frequency and UTI symptoms. The patient was found to be tachycardic in the 150s and was found to have atrophic fibrillation with RVR. She does have some mild dizziness and lightheadedness and gets short winded when cannulating. This happens intermittently. Her symptoms also worsen with lifting, caffeine use, vigorous exercise. Resting improves her symptoms. She's been evaluated by cardiology in the past, who recommended ablation. She declined ablation at that point. She also has been declining to go on anticoagulation.   Review of Systems:   Pt denies any fevers, chills, nausea, vomiting, lightheadedness, headaches, blurred vision, chest pain, palpitations, abdominal pain, constipation, bleeding, melanoma.  Review of systems are otherwise negative  Past Medical History  Diagnosis Date  . Vertigo   . Hypertension   . Episodic atrial fibrillation     Converted to NSR recent hospitalization 01/2013 on Dilt   Past Surgical History  Procedure Laterality Date  . Wrist fracture surgery     Social History:  reports that she has never smoked. She has never used smokeless tobacco. She reports that she drinks alcohol. She reports that she does not use illicit drugs. Patient lives at home & is able to participate in activities of daily living   Allergies  Allergen Reactions  . Bystolic [Nebivolol Hcl] Other (See Comments)    Extreme chest pains/ lowering of blood pressure causing dizziness, falling  . Metoprolol Other (See Comments)   Extreme drop in blood pressure, vertigo resulted  . Asa [Aspirin]     Bleeding gums  . Edarbi [Azilsartan] Hives  . Lasix [Furosemide] Other (See Comments)    Cramping in thigh area   . Zantac [Ranitidine Hcl]     Family History  Problem Relation Age of Onset  . Cancer Other   . Cardiomyopathy Brother     Congenital with need for heart transplant. Died at 46.      Prior to Admission medications   Medication Sig Start Date End Date Taking? Authorizing Provider  Calcium-Magnesium-Vitamin D (CALCIUM MAGNESIUM PO) Take 3 tablets by mouth once a week.    Yes Historical Provider, MD  esomeprazole (NEXIUM) 40 MG capsule Take 40 mg by mouth daily at 12 noon.    Historical Provider, MD    Physical Exam: BP 118/81 mmHg  Pulse 126  Temp(Src) 98.3 F (36.8 C) (Oral)  Resp 16  Ht 5' 7.5" (1.715 m)  Wt 90.719 kg (200 lb)  BMI 30.84 kg/m2  SpO2 97%  General: Elderly Caucasian female. Awake and alert and oriented x3. No acute cardiopulmonary distress.  Eyes: Pupils equal, round, reactive to light. Extraocular muscles are intact. Sclerae anicteric and noninjected.  ENT: Moist mucosal membranes. No mucosal lesions.   Neck: Neck supple without lymphadenopathy. No carotid bruits. No masses palpated.  Cardiovascular: Regular rate with normal S1-S2 sounds. No murmurs, rubs, gallops auscultated. No JVD.  Respiratory: Good respiratory effort with no wheezes, rales, rhonchi. Lungs clear to auscultation bilaterally.  Abdomen: Soft, nontender, nondistended. Active bowel sounds. No masses or hepatosplenomegaly  Skin: Dry, warm to touch. 2+ dorsalis pedis and radial pulses. Musculoskeletal: No  calf or leg pain. All major joints not erythematous nontender.  Psychiatric: Intact judgment and insight.  Neurologic: No focal neurological deficits. Cranial nerves II through XII are grossly intact.           Labs on Admission:  Basic Metabolic Panel:  Recent Labs Lab 07/18/14 1510  NA 140  K 3.9    CL 105  CO2 27  GLUCOSE 93  BUN 12  CREATININE 0.81  CALCIUM 8.8*   Liver Function Tests:  Recent Labs Lab 07/18/14 1510  AST 29  ALT 23  ALKPHOS 84  BILITOT 0.7  PROT 7.8  ALBUMIN 4.2   No results for input(s): LIPASE, AMYLASE in the last 168 hours. No results for input(s): AMMONIA in the last 168 hours. CBC:  Recent Labs Lab 07/18/14 1510  WBC 5.9  NEUTROABS 3.5  HGB 13.2  HCT 41.2  MCV 88.0  PLT 241   Cardiac Enzymes:  Recent Labs Lab 07/18/14 1510  TROPONINI <0.03    BNP (last 3 results) No results for input(s): BNP in the last 8760 hours.  ProBNP (last 3 results) No results for input(s): PROBNP in the last 8760 hours.  CBG: No results for input(s): GLUCAP in the last 168 hours.  Radiological Exams on Admission: No results found.  EKG: Independently reviewed. Atrial fibrillation with RVR  Assessment/Plan Present on Admission:  . Atrial fibrillation with RVR  This patient was discussed with the ED physician, including pertinent vitals, physical exam findings, labs, and imaging.  We also discussed care given by the ED provider.  #1 atrial fibrillation/flutter with RVR #2 hypertension #3 paroxysmal atrial fibrillation #4 GERD  Admit the patient to stepdown unit Continue Cardizem Recheck CMP in the morning I discussed extensively with the patient the risks of chronic anticoagulation for paroxysmal atrial fibrillation, especially the risk of having a stroke with not being anticoagulated. The patient continues to decline chronic anticoagulation. Will give Lovenox during the hospitalization for DVT prophylaxis  DVT prophylaxis: Lovenox  Consultants: None  Code Status: Full code  Family Communication: Husband in the room   Disposition Plan: Home following resolution of RVR   Levie Heritage, DO [facility] Pager [phone]

## 2014-07-18 NOTE — Progress Notes (Signed)
Patient's heart still bradycardic from 52 to 68.  New 12 lead EKG obtained and paged Dr. Adrian Blackwater about the Cardizem dose possibly being decreased or held since current dose is SR (Sustain released) at 90 mg.

## 2014-07-18 NOTE — ED Notes (Signed)
Notified EDP and Hospitalist of pt HR converting to SR at 55. Cardizem gtt off, pt to be placed on PO meds and admitted to ICU.

## 2014-07-18 NOTE — ED Provider Notes (Signed)
TIME SEEN: 2:45 PM  CHIEF COMPLAINT: Dysuria, urinary hesitancy  HPI: Pt is a 69 y.o. female with history of hypertension, paroxysmal atrial fibrillation no longer on any medications for rate control and not on anticoagulation who presents to the emergency department with complaints of dysuria, urinary frequency and urgency and hesitancy for the past several days. Feels like her prior UTIs. Was initially placed in fast track but noted to have a heart rate in the 150s. Denies that she has noticed any palpitations, chest pain or shortness of breath. She has not had any lightheadedness. No vomiting or diarrhea. Bloody stool or melana.  No fever. States she was told by her cardiologist that she could stop taking her rate controlling medication. It appears per Dr. Lubertha Basque previous notes that they have recommended continuing diltiazem. They had recommended anticoagulation which patient refused. She has a chads 2 score of 1.  ROS: See HPI Constitutional: no fever  Eyes: no drainage  ENT: no runny nose   Cardiovascular:  no chest pain  Resp: no SOB  GI: no vomiting GU: no dysuria Integumentary: no rash  Allergy: no hives  Musculoskeletal: no leg swelling  Neurological: no slurred speech ROS otherwise negative  PAST MEDICAL HISTORY/PAST SURGICAL HISTORY:  Past Medical History  Diagnosis Date  . Vertigo   . Hypertension   . Episodic atrial fibrillation     Converted to NSR recent hospitalization 01/2013 on Dilt    MEDICATIONS:  Prior to Admission medications   Medication Sig Start Date End Date Taking? Authorizing Provider  Calcium-Magnesium-Vitamin D (CALCIUM MAGNESIUM PO) Take 3 tablets by mouth once a week.    Yes Historical Provider, MD  esomeprazole (NEXIUM) 40 MG capsule Take 40 mg by mouth daily at 12 noon.    Historical Provider, MD  loratadine (CLARITIN) 10 MG tablet Take 1 tablet (10 mg total) by mouth daily. Patient not taking: Reported on 07/18/2014 08/25/13   Janne Napoleon, NP   predniSONE (DELTASONE) 10 MG tablet Take 2 tablets (20 mg total) by mouth 2 (two) times daily with a meal. Patient not taking: Reported on 07/18/2014 08/25/13   Janne Napoleon, NP    ALLERGIES:  Allergies  Allergen Reactions  . Bystolic [Nebivolol Hcl] Other (See Comments)    Extreme chest pains/ lowering of blood pressure causing dizziness, falling  . Metoprolol Other (See Comments)    Extreme drop in blood pressure, vertigo resulted  . Asa [Aspirin]     Bleeding gums  . Edarbi [Azilsartan] Hives  . Lasix [Furosemide] Other (See Comments)    Cramping in thigh area   . Zantac [Ranitidine Hcl]     SOCIAL HISTORY:  History  Substance Use Topics  . Smoking status: Never Smoker   . Smokeless tobacco: Never Used  . Alcohol Use: 0.0 oz/week    4-5 drink(s) per week    FAMILY HISTORY: Family History  Problem Relation Age of Onset  . Cancer Other   . Cardiomyopathy Brother     Congenital with need for heart transplant. Died at 74.    EXAM: BP 135/91 mmHg  Pulse 68  Temp(Src) 99 F (37.2 C)  Resp 20  Ht 5' 7.5" (1.715 m)  Wt 200 lb (90.719 kg)  BMI 30.84 kg/m2  SpO2 100% CONSTITUTIONAL: Alert and oriented and responds appropriately to questions. Well-appearing; well-nourished; nontoxic HEAD: Normocephalic EYES: Conjunctivae clear, PERRL ENT: normal nose; no rhinorrhea; moist mucous membranes; pharynx without lesions noted NECK: Supple, no meningismus, no LAD  CARD:  Irregularly irregular and tachycardic; S1 and S2 appreciated; no murmurs, no clicks, no rubs, no gallops RESP: Normal chest excursion without splinting or tachypnea; breath sounds clear and equal bilaterally; no wheezes, no rhonchi, no rales, no hypoxia or respiratory distress, speaking full sentences ABD/GI: Normal bowel sounds; non-distended; soft, non-tender, no rebound, no guarding, no peritoneal signs BACK:  The back appears normal and is non-tender to palpation, there is no CVA tenderness EXT: Normal ROM in  all joints; non-tender to palpation; no edema; normal capillary refill; no cyanosis, no calf tenderness or swelling    SKIN: Normal color for age and race; warm NEURO: Moves all extremities equally, sensation to light touch intact diffusely, cranial nerves II through XII intact PSYCH: The patient's mood and manner are appropriate. Grooming and personal hygiene are appropriate.  MEDICAL DECISION MAKING: Patient here with A. fib with RVR. She is mildly hypertensive. No complaints of chest pain, shortness of breath, palpitations, lightheadedness.  Patient's labs are unremarkable including normal electrolytes. Normal lactate. Negative troponin. Normal TSH. Urine shows no sign of infection. Given diltiazem bolus and drip. Discussed with Dr. Adrian Blackwater for admission. Dr. Diona Browner with cardiology also consult it and he agrees patient needs to be started back on medication for rate control. He recommends diltiazem as an outpatient.  ED PROGRESS: Patient has been admitted to hospital service. She has spontaneously converted on diltiazem. Instructed nurse to seek further orders from Dr. Adrian Blackwater.     EKG Interpretation  Date/Time:  Friday Jul 18 2014 15:11:41 EDT Ventricular Rate:  129 PR Interval:  54 QRS Duration: 116 QT Interval:  346 QTC Calculation: 507 R Axis:   -19 Text Interpretation:  Atrial fibrillation with rapid ventricular rate Multiple premature complexes, vent  Right bundle branch block Nonspecific T abnormalities, lateral leads Confirmed by WARD,  DO, KRISTEN [phone]) on 07/18/2014 3:22:49 PM        EKG Interpretation  Date/Time:  Friday Jul 18 2014 19:23:23 EDT Ventricular Rate:  53 PR Interval:  165 QRS Duration: 121 QT Interval:  526 QTC Calculation: 494 R Axis:   -10 Text Interpretation:  Sinus rhythm Right bundle branch block Nonspecific T abnormalities, lateral leads Confirmed by WARD,  DO, KRISTEN ([id number]) on 07/18/2014 7:28:05 PM         CRITICAL CARE Performed by:  Raelyn Number   Total critical care time: 45 minutes - A. fib with RVR, diltiazem drip  Critical care time was exclusive of separately billable procedures and treating other patients.  Critical care was necessary to treat or prevent imminent or life-threatening deterioration.  Critical care was time spent personally by me on the following activities: development of treatment plan with patient and/or surrogate as well as nursing, discussions with consultants, evaluation of patient's response to treatment, examination of patient, obtaining history from patient or surrogate, ordering and performing treatments and interventions, ordering and review of laboratory studies, ordering and review of radiographic studies, pulse oximetry and re-evaluation of patient's condition.   Layla Maw Ward, DO 07/18/14 2122

## 2014-07-18 NOTE — ED Notes (Signed)
SR rate 50's Cardizem gtt stopped, Dr. Elesa Massed informed. Gave report to [facility], ICU. Second peripheral IV started

## 2014-07-19 ENCOUNTER — Encounter (HOSPITAL_COMMUNITY): Payer: Self-pay | Admitting: Internal Medicine

## 2014-07-19 ENCOUNTER — Inpatient Hospital Stay (HOSPITAL_COMMUNITY): Payer: Medicare HMO

## 2014-07-19 DIAGNOSIS — I4510 Unspecified right bundle-branch block: Secondary | ICD-10-CM | POA: Diagnosis present

## 2014-07-19 DIAGNOSIS — I4891 Unspecified atrial fibrillation: Secondary | ICD-10-CM

## 2014-07-19 LAB — BASIC METABOLIC PANEL
ANION GAP: 10 (ref 5–15)
BUN: 11 mg/dL (ref 6–20)
CO2: 26 mmol/L (ref 22–32)
CREATININE: 0.64 mg/dL (ref 0.44–1.00)
Calcium: 8.8 mg/dL — ABNORMAL LOW (ref 8.9–10.3)
Chloride: 104 mmol/L (ref 101–111)
GFR calc Af Amer: >60 mL/min (ref >60)
GFR calc non Af Amer: >60 mL/min (ref >60)
GLUCOSE: 95 mg/dL (ref 65–99)
POTASSIUM: 4.1 mmol/L (ref 3.5–5.1)
Sodium: 140 mmol/L (ref 135–145)

## 2014-07-19 LAB — MRSA PCR SCREENING: MRSA BY PCR: NEGATIVE

## 2014-07-19 LAB — PHOSPHORUS: Phosphorus: 3.8 mg/dL (ref 2.5–4.6)

## 2014-07-19 LAB — MAGNESIUM: Magnesium: 2 mg/dL (ref 1.7–2.4)

## 2014-07-19 MED ORDER — DILTIAZEM HCL 30 MG PO TABS: 30.0000 mg | ORAL_TABLET | Freq: Three times a day (TID) | ORAL | Status: DC | PRN

## 2014-07-19 MED ORDER — ASPIRIN 81 MG PO CHEW: 81.0000 mg | CHEWABLE_TABLET | Freq: Every day | ORAL | Status: DC

## 2014-07-19 NOTE — Progress Notes (Signed)
Patient's heart stayed between 52 and 63 throughout the remainder of the shift no as needed medications required for tachycardia at this time.

## 2014-07-19 NOTE — Progress Notes (Signed)
Patient ambulated around ICU, two laps, with Caleb Hanks NT. Heart rate 61, NSR at rest. With ambulation, heart rate ranged between 68-78, NSR. At rest, after walking, HR 65, NSR. Patient voiced no complaints of chest pain, dyspnea, or pain with ambulation.

## 2014-07-19 NOTE — Discharge Summary (Signed)
Physician Discharge Summary  Kirstin Housen YJE:[id number] DOB: 09-09-1945 DOA: 07/18/2014  PCP: No PCP Per Patient  Admit date: 07/18/2014 Discharge date: 07/19/2014  Time spent: Greater than 30 minutes  Recommendations for Outpatient Follow-up:  1. The patient was instructed to call cardiologist Dr. Sharrell Ku for follow-up appointment as soon as possible. 2.   Discharge Diagnoses:  1. Atrial fibrillation with RVR. 2. History of atrial flutter. 3. Right bundle branch block and left axis deviation and lateral T-wave inversions on EKG, likely chronic. 3. Urinary frequency, with no evidence of UTI.  Discharge Condition: Improved.  Diet recommendation: Heart healthy.  Filed Weights   07/18/14 1214 07/18/14 2001 07/19/14 0515  Weight: 90.719 kg (200 lb) 90.2 kg (198 lb 13.7 oz) 90 kg (198 lb 6.6 oz)    History of present illness:  The patient is a 69 year old woman with a history of paroxysmal atrial fibrillation/atrial flutter, who presented to the emergency department on 07/18/2014 with a complaint of urinary frequency. During the evaluation in the emergency department, she was found to be tachycardic with a heart rate in the 150s. A number of EKGs were ordered with results ranging from sinus tachycardia versus atrial flutter versus atrial fibrillation. She did endorse occasional lightheadedness and becoming short of breath with ambulation at times. Her symptoms also seem to worsen with lifting, caffeine use, and vigorous exercise. She was admitted for further evaluation and management.  Hospital Course:  The patient was started on a diltiazem drip and admitted to the stepdown unit. Her urinalysis was found to be unremarkable with the exception of greater than 1.030 specific gravity. She had several EKGs during her stay in the emergency department which were grossly abnormal ranging from sinus tachycardia with PVCs versus atrial flutter versus atrial fibrillation. The EKGs generally  showed an incomplete right bundle branch block, PVCs, and lateral T-wave inversions.  EKG in April 2015 was reviewed and it revealed  right bundle branch block, left axis deviation, and T-wave inversions which appearred to be chronic.  Per review of her chart, she had a history of episodic atrial fibrillation and atrial flutter for which she was evaluated by Dr. Gilman Schmidt in 2014. At that time, he recommended catheter ablation of her atrial flutter. He also offered anticoagulation. The patient refused both even though she was given the risk and benefits, namely, risk of stroke off of anticoagulation. She had been started on diltiazem at 60 mg twice a day, but she found that her heart rate became too low on this medication, so she stopped it. She also stopped taking aspirin as she thought that it was causing some stomach irritation. Although she had been placed on Nexium, she decided to stop aspirin altogether. Of note, she does not have a true allergy to aspirin.  The diltiazem drip was discontinued as she became bradycardic. A number studies were ordered. Her troponin I was negative 1. Her TSH was within normal limits at 1.4. Her echocardiogram revealed an EF of 60% with no significant valvular abnormalities.  Prior to discharge, the patient was noted to be in sinus rhythm with a tendency towards sinus bradycardia. With ambulation in the ICU, her heart rate ranged from 68-78 bpm and at rest following ambulation, 65 bpm. She agreed to starting aspirin at 81 mg daily. She was given a prescription for diltiazem-30 mg to be taken every 8 hours for sustained heart rate of greater than 120. She was strongly encouraged to follow-up with Dr. Gilman Schmidt. She voiced understanding.  Procedures: 2-D echocardiogram: Study Conclusions - Left ventricle: The cavity size was normal. Wall thickness was increased in a pattern of mild LVH. The estimated ejection fraction was 60%. Wall motion was  normal; there were no regional wall motion abnormalities. - Left atrium: The atrium was moderately dilated. - Right ventricle: The cavity size was normal. Systolic function was normal. - Right atrium: The atrium was mildly dilated.  Consultations:  None  Discharge Exam: Filed Vitals:   07/19/14 1222  BP:   Pulse:   Temp: 98.1 F (36.7 C)  Resp:    temperature 98.1. Respiratory rate 16. Pulse rate 66. Blood pressure 112/51. Oxygen saturation 98% on room air.  General: Pleasant alert 69 year old woman in no acute distress. Cardiovascular: S1, S2, with a soft systolic murmur and occasional ectopy. Respiratory: Clear to auscultation bilaterally. Extremities: No pedal edema.  Discharge Instructions   Discharge Instructions    Diet - low sodium heart healthy    Complete by:  As directed      Discharge instructions    Complete by:  As directed   Take medications as prescribed. Check your heart rate if he become dizzy or has chest pain. Take diltiazem (Cardizem) as directed for sustained heart rate of greater than 120. Follow-up with cardiology by calling for an appointment.     Increase activity slowly    Complete by:  As directed           Current Discharge Medication List    START taking these medications   Details  aspirin (ASPIRIN CHILDRENS) 81 MG chewable tablet Chew 1 tablet (81 mg total) by mouth daily.    diltiazem (CARDIZEM) 30 MG tablet Take 1 tablet (30 mg total) by mouth every 8 (eight) hours as needed (For a sustained heart rate of greater than 120 bpm.). Qty: 60 tablet, Refills: 1      CONTINUE these medications which have NOT CHANGED   Details  Calcium-Magnesium-Vitamin D (CALCIUM MAGNESIUM PO) Take 3 tablets by mouth once a week.     esomeprazole (NEXIUM) 40 MG capsule Take 40 mg by mouth daily at 12 noon.       Allergies  Allergen Reactions  . Bystolic [Nebivolol Hcl] Other (See Comments)    Extreme chest pains/ lowering of blood pressure  causing dizziness, falling  . Metoprolol Other (See Comments)    Extreme drop in blood pressure, vertigo resulted  . Asa [Aspirin]     Bleeding gums  . Edarbi [Azilsartan] Hives  . Lasix [Furosemide] Other (See Comments)    Cramping in thigh area   . Zantac [Ranitidine Hcl]    Follow-up Information    Call Lewayne Bunting, MD.   Specialty:  Cardiology   Why:  Call to make an appointment to be seen in the next 1-2 weeks.   Contact information:   [address] [place] [address] [phone]        The results of significant diagnostics from this hospitalization (including imaging, microbiology, ancillary and laboratory) are listed below for reference.    Significant Diagnostic Studies: No results found.  Microbiology: Recent Results (from the past 240 hour(s))  MRSA PCR Screening     Status: None   Collection Time: 07/18/14  7:47 PM  Result Value Ref Range Status   MRSA by PCR NEGATIVE NEGATIVE Final    Comment:        The GeneXpert MRSA Assay (FDA approved for NASAL specimens only), is one component of a comprehensive  MRSA colonization surveillance program. It is not intended to diagnose MRSA infection nor to guide or monitor treatment for MRSA infections.      Labs: Basic Metabolic Panel:  Recent Labs Lab 07/18/14 1510 07/19/14 0519  NA 140 140  K 3.9 4.1  CL 105 104  CO2 27 26  GLUCOSE 93 95  BUN 12 11  CREATININE 0.81 0.64  CALCIUM 8.8* 8.8*  MG  --  2.0  PHOS  --  3.8   Liver Function Tests:  Recent Labs Lab 07/18/14 1510  AST 29  ALT 23  ALKPHOS 84  BILITOT 0.7  PROT 7.8  ALBUMIN 4.2   No results for input(s): LIPASE, AMYLASE in the last 168 hours. No results for input(s): AMMONIA in the last 168 hours. CBC:  Recent Labs Lab 07/18/14 1510  WBC 5.9  NEUTROABS 3.5  HGB 13.2  HCT 41.2  MCV 88.0  PLT 241   Cardiac Enzymes:  Recent Labs Lab 07/18/14 1510  TROPONINI <0.03   BNP: BNP (last 3 results) No  results for input(s): BNP in the last 8760 hours.  ProBNP (last 3 results) No results for input(s): PROBNP in the last 8760 hours.  CBG: No results for input(s): GLUCAP in the last 168 hours.     Signed:  Daysha Ashmore  [facility] 07/19/2014, 12:26 PM

## 2014-07-19 NOTE — Progress Notes (Signed)
*  PRELIMINARY RESULTS* Echocardiogram 2D Echocardiogram has been performed.  Lindsay Whitehead 07/19/2014, 3:21 PM

## 2014-07-19 NOTE — ED Provider Notes (Signed)
CSN: [id number]     Arrival date & time 07/18/14  1138 History   First MD Initiated Contact with Patient 07/18/14 1242     Chief Complaint  Patient presents with  . Urinary Tract Infection     (Consider location/radiation/quality/duration/timing/severity/associated sxs/prior Treatment) Patient is a 69 y.o. female presenting with urinary tract infection. The history is provided by the patient.  Urinary Tract Infection Pain quality:  Burning and shooting Pain severity:  Moderate Onset quality:  Gradual Duration:  3 days Timing:  Intermittent Progression:  Worsening Chronicity:  Recurrent Relieved by:  Nothing Ineffective treatments: cranberry sauce and tart cherry. Urinary symptoms: hesitancy   Urinary symptoms: no foul-smelling urine and no hematuria   Associated symptoms: no fever, no nausea, no vaginal discharge and no vomiting   Risk factors: no kidney transplant, not single kidney and no urinary catheter     Past Medical History  Diagnosis Date  . Vertigo   . Hypertension   . Episodic atrial fibrillation     Converted to NSR recent hospitalization 01/2013 on Dilt   Past Surgical History  Procedure Laterality Date  . Wrist fracture surgery     Family History  Problem Relation Age of Onset  . Cancer Other   . Cardiomyopathy Brother     Congenital with need for heart transplant. Died at 84.   History  Substance Use Topics  . Smoking status: Never Smoker   . Smokeless tobacco: Never Used  . Alcohol Use: 0.0 oz/week    4-5 Standard drinks or equivalent per week   OB History    Gravida Para Term Preterm AB TAB SAB Ectopic Multiple Living   Review of Systems  Constitutional: Negative for fever.  Cardiovascular: Positive for palpitations.  Gastrointestinal: Negative for nausea and vomiting.  Genitourinary: Positive for dysuria and difficulty urinating. Negative for hematuria and vaginal discharge.  Neurological: Positive for dizziness.  All  other systems reviewed and are negative.     Allergies  Bystolic; Metoprolol; Asa; Edarbi; Lasix; and Zantac  Home Medications   Prior to Admission medications   Medication Sig Start Date End Date Taking? Authorizing Provider  Calcium-Magnesium-Vitamin D (CALCIUM MAGNESIUM PO) Take 3 tablets by mouth once a week.    Yes Historical Provider, MD  esomeprazole (NEXIUM) 40 MG capsule Take 40 mg by mouth daily at 12 noon.    Historical Provider, MD   BP 132/49 mmHg  Pulse 57  Temp(Src) 98.1 F (36.7 C) (Oral)  Resp 16  Ht  (1.727 m)  Wt 198 lb 6.6 oz (90 kg)  BMI 30.18 kg/m2  SpO2 95% Physical Exam  Constitutional: She is oriented to person, place, and time. She appears well-developed and well-nourished.  Non-toxic appearance.  HENT:  Head: Normocephalic.  Right Ear: Tympanic membrane and external ear normal.  Left Ear: Tympanic membrane and external ear normal.  Eyes: EOM and lids are normal. Pupils are equal, round, and reactive to light.  Neck: Normal range of motion. Neck supple. Carotid bruit is not present.  Cardiovascular: Intact distal pulses and normal pulses.  An irregularly irregular rhythm present. Tachycardia present.   Tachycardia of 150, irreg irreg rhythm.  Pulmonary/Chest: Breath sounds normal. No respiratory distress.  Abdominal: Soft. Bowel sounds are normal. She exhibits no distension. There is tenderness. There is no guarding.  No CVAT Mild suprapubic tenderness  Musculoskeletal: Normal range of motion. She exhibits no edema.  Lymphadenopathy:       Head (right side): No submandibular adenopathy present.       Head (left side): No submandibular adenopathy present.    She has no cervical adenopathy.  Neurological: She is alert and oriented to person, place, and time. She has normal strength. No cranial nerve deficit or sensory deficit.  Skin: Skin is warm and dry.  Psychiatric: She has a normal mood and affect. Her speech is normal.  Nursing note and  vitals reviewed.   ED Course  Procedures (including critical care time) Labs Review Labs Reviewed  URINALYSIS, ROUTINE W REFLEX MICROSCOPIC (NOT AT [facility]) - Abnormal; Notable for the following:    Specific Gravity, Urine >1.030 (*)    Protein, ur TRACE (*)    All other components within normal limits  URINE MICROSCOPIC-ADD ON - Abnormal; Notable for the following:    Squamous Epithelial / LPF MANY (*)    All other components within normal limits  COMPREHENSIVE METABOLIC PANEL - Abnormal; Notable for the following:    Calcium 8.8 (*)    All other components within normal limits  MRSA PCR SCREENING  CBC WITH DIFFERENTIAL/PLATELET  TROPONIN I  TSH  BASIC METABOLIC PANEL  MAGNESIUM  PHOSPHORUS  I-STAT CG4 LACTIC ACID, ED    Imaging Review No results found.   EKG Interpretation   Date/Time:  Friday Jul 18 2014 19:23:23 EDT Ventricular Rate:  53 PR Interval:  165 QRS Duration: 121 QT Interval:  526 QTC Calculation: 494 R Axis:   -10 Text Interpretation:  Sinus rhythm Right bundle branch block Nonspecific T  abnormalities, lateral leads Confirmed by WARD,  DO, KRISTEN ([id number]) on  07/18/2014 7:28:05 PM      MDM  Pt awake and alert. Noted to have a tachycardia with irreg irreg rhythm. Pulse on admission to ED 68 wth b/p of 135/91.  EKG and IV ordered. Case presented to Dr. Elesa Massed. Pt moved from Fast Tract to Acute ED. Discussed Neg UA and abnormal physical findings with patient. Pt in agreement with move to Acute ED.   Final diagnoses:  Atrial fibrillation with RVR    **I have reviewed nursing notes, vital signs, and all appropriate lab and imaging results for this patient.Ivery Quale, PA-C 07/19/14 2334  Layla Maw Ward, DO 07/22/14 3568

## 2014-07-19 NOTE — Progress Notes (Signed)
Patient discharged to home, with husband. Discharge instructions given regarding follow-up, medications, diet, and activity. Patient able to "teach back" information. IV removed, site clean, dry, and intact.

## 2014-09-24 ENCOUNTER — Other Ambulatory Visit (HOSPITAL_COMMUNITY): Payer: Self-pay | Admitting: Internal Medicine

## 2014-09-24 DIAGNOSIS — Z1231 Encounter for screening mammogram for malignant neoplasm of breast: Secondary | ICD-10-CM

## 2014-10-02 ENCOUNTER — Ambulatory Visit (HOSPITAL_COMMUNITY)
Admission: RE | Admit: 2014-10-02 | Discharge: 2014-10-02 | Disposition: A | Discharge status: Home / Self Care | Payer: Medicare HMO | Source: Ambulatory Visit | Attending: Internal Medicine | Admitting: Internal Medicine

## 2014-10-02 DIAGNOSIS — Z1231 Encounter for screening mammogram for malignant neoplasm of breast: Secondary | ICD-10-CM

## 2014-10-03 ENCOUNTER — Other Ambulatory Visit: Payer: Self-pay | Admitting: Internal Medicine

## 2014-10-03 DIAGNOSIS — R928 Other abnormal and inconclusive findings on diagnostic imaging of breast: Secondary | ICD-10-CM

## 2014-10-14 ENCOUNTER — Ambulatory Visit (HOSPITAL_COMMUNITY)
Admission: RE | Admit: 2014-10-14 | Discharge: 2014-10-14 | Disposition: A | Discharge status: Home / Self Care | Payer: Medicare HMO | Source: Ambulatory Visit | Attending: Internal Medicine | Admitting: Internal Medicine

## 2014-10-14 ENCOUNTER — Other Ambulatory Visit (HOSPITAL_COMMUNITY): Payer: Self-pay | Admitting: Internal Medicine

## 2014-10-14 DIAGNOSIS — R928 Other abnormal and inconclusive findings on diagnostic imaging of breast: Secondary | ICD-10-CM | POA: Diagnosis present

## 2014-10-14 DIAGNOSIS — R921 Mammographic calcification found on diagnostic imaging of breast: Secondary | ICD-10-CM | POA: Diagnosis not present

## 2014-10-14 DIAGNOSIS — N6320 Unspecified lump in the left breast, unspecified quadrant: Secondary | ICD-10-CM

## 2014-10-14 DIAGNOSIS — N6002 Solitary cyst of left breast: Secondary | ICD-10-CM | POA: Diagnosis not present

## 2014-11-17 ENCOUNTER — Inpatient Hospital Stay (HOSPITAL_COMMUNITY)
Admission: EM | Admit: 2014-11-17 | Discharge: 2014-11-21 | DRG: 292 | Disposition: A | Discharge status: Home / Self Care | Payer: Medicare HMO | Attending: Internal Medicine | Admitting: Internal Medicine

## 2014-11-17 ENCOUNTER — Emergency Department (HOSPITAL_COMMUNITY): Payer: Medicare HMO

## 2014-11-17 ENCOUNTER — Encounter (HOSPITAL_COMMUNITY): Payer: Self-pay | Admitting: Emergency Medicine

## 2014-11-17 DIAGNOSIS — I509 Heart failure, unspecified: Secondary | ICD-10-CM

## 2014-11-17 DIAGNOSIS — I480 Paroxysmal atrial fibrillation: Secondary | ICD-10-CM | POA: Diagnosis present

## 2014-11-17 DIAGNOSIS — F419 Anxiety disorder, unspecified: Secondary | ICD-10-CM | POA: Diagnosis present

## 2014-11-17 DIAGNOSIS — I5033 Acute on chronic diastolic (congestive) heart failure: Secondary | ICD-10-CM | POA: Diagnosis present

## 2014-11-17 DIAGNOSIS — I4892 Unspecified atrial flutter: Secondary | ICD-10-CM | POA: Diagnosis present

## 2014-11-17 DIAGNOSIS — R0602 Shortness of breath: Secondary | ICD-10-CM | POA: Diagnosis present

## 2014-11-17 DIAGNOSIS — D649 Anemia, unspecified: Secondary | ICD-10-CM | POA: Diagnosis present

## 2014-11-17 DIAGNOSIS — Z7982 Long term (current) use of aspirin: Secondary | ICD-10-CM | POA: Diagnosis not present

## 2014-11-17 DIAGNOSIS — G4733 Obstructive sleep apnea (adult) (pediatric): Secondary | ICD-10-CM | POA: Diagnosis present

## 2014-11-17 DIAGNOSIS — E669 Obesity, unspecified: Secondary | ICD-10-CM | POA: Diagnosis present

## 2014-11-17 DIAGNOSIS — I4891 Unspecified atrial fibrillation: Secondary | ICD-10-CM

## 2014-11-17 DIAGNOSIS — I5020 Unspecified systolic (congestive) heart failure: Secondary | ICD-10-CM

## 2014-11-17 DIAGNOSIS — I5031 Acute diastolic (congestive) heart failure: Secondary | ICD-10-CM | POA: Diagnosis not present

## 2014-11-17 DIAGNOSIS — Z6832 Body mass index (BMI) 32.0-32.9, adult: Secondary | ICD-10-CM

## 2014-11-17 DIAGNOSIS — I5021 Acute systolic (congestive) heart failure: Secondary | ICD-10-CM | POA: Diagnosis not present

## 2014-11-17 DIAGNOSIS — I5030 Unspecified diastolic (congestive) heart failure: Secondary | ICD-10-CM | POA: Diagnosis not present

## 2014-11-17 DIAGNOSIS — I4510 Unspecified right bundle-branch block: Secondary | ICD-10-CM | POA: Diagnosis present

## 2014-11-17 DIAGNOSIS — I10 Essential (primary) hypertension: Secondary | ICD-10-CM | POA: Diagnosis present

## 2014-11-17 LAB — COMPREHENSIVE METABOLIC PANEL
ALBUMIN: 4.5 g/dL (ref 3.5–5.0)
ALK PHOS: 98 U/L (ref 38–126)
ALT: 39 U/L (ref 14–54)
AST: 50 U/L — AB (ref 15–41)
Anion gap: 7 (ref 5–15)
BUN: 12 mg/dL (ref 6–20)
CALCIUM: 8.9 mg/dL (ref 8.9–10.3)
CO2: 29 mmol/L (ref 22–32)
Chloride: 104 mmol/L (ref 101–111)
Creatinine, Ser: 0.75 mg/dL (ref 0.44–1.00)
GFR calc Af Amer: >60 mL/min (ref >60)
GFR calc non Af Amer: >60 mL/min (ref >60)
GLUCOSE: 86 mg/dL (ref 65–99)
Potassium: 3.9 mmol/L (ref 3.5–5.1)
SODIUM: 140 mmol/L (ref 135–145)
Total Bilirubin: 1.2 mg/dL (ref 0.3–1.2)
Total Protein: 8.3 g/dL — ABNORMAL HIGH (ref 6.5–8.1)

## 2014-11-17 LAB — BRAIN NATRIURETIC PEPTIDE: B Natriuretic Peptide: 352 pg/mL — ABNORMAL HIGH (ref 0.0–100.0)

## 2014-11-17 LAB — TROPONIN I
Troponin I: <0.03 ng/mL (ref <0.031)
Troponin I: <0.03 ng/mL (ref <0.031)

## 2014-11-17 LAB — CBC WITH DIFFERENTIAL/PLATELET
BASOS PCT: 1 %
Basophils Absolute: 0 10*3/uL (ref 0.0–0.1)
Eosinophils Absolute: 0.2 10*3/uL (ref 0.0–0.7)
Eosinophils Relative: 3 %
HCT: 39.5 % (ref 36.0–46.0)
HEMOGLOBIN: 12.6 g/dL (ref 12.0–15.0)
Lymphocytes Relative: 19 %
Lymphs Abs: 1.2 10*3/uL (ref 0.7–4.0)
MCH: 27.9 pg (ref 26.0–34.0)
MCHC: 31.9 g/dL (ref 30.0–36.0)
MCV: 87.6 fL (ref 78.0–100.0)
Monocytes Absolute: 0.5 10*3/uL (ref 0.1–1.0)
Monocytes Relative: 8 %
NEUTROS ABS: 4.4 10*3/uL (ref 1.7–7.7)
NEUTROS PCT: 69 %
Platelets: 244 10*3/uL (ref 150–400)
RBC: 4.51 MIL/uL (ref 3.87–5.11)
RDW: 13.7 % (ref 11.5–15.5)
WBC: 6.4 10*3/uL (ref 4.0–10.5)

## 2014-11-17 LAB — D-DIMER, QUANTITATIVE: D-Dimer, Quant: 2.4 ug/mL-FEU — ABNORMAL HIGH (ref 0.00–0.48)

## 2014-11-17 LAB — I-STAT TROPONIN, ED: TROPONIN I, POC: 0 ng/mL (ref 0.00–0.08)

## 2014-11-17 LAB — TSH: TSH: 2.298 u[IU]/mL (ref 0.350–4.500)

## 2014-11-17 MED ORDER — ACETAMINOPHEN 325 MG PO TABS
650.0000 mg | ORAL_TABLET | Freq: Four times a day (QID) | ORAL | Status: AC | PRN
  Administered 2014-11-17 – 2014-11-19 (×2): 650 mg via ORAL
  Filled 2014-11-17 (×2): qty 2

## 2014-11-17 MED ORDER — POTASSIUM CHLORIDE CRYS ER 10 MEQ PO TBCR: 10.0000 meq | EXTENDED_RELEASE_TABLET | Freq: Every day | ORAL | Status: DC

## 2014-11-17 MED ORDER — VITAMIN D 1000 UNITS PO TABS
6000.0000 [IU] | ORAL_TABLET | Freq: Every day | ORAL | Status: DC
  Administered 2014-11-17 – 2014-11-21 (×5): 6000 [IU] via ORAL
  Filled 2014-11-17 (×5): qty 6

## 2014-11-17 MED ORDER — AMLODIPINE BESYLATE 5 MG PO TABS
5.0000 mg | ORAL_TABLET | Freq: Every day | ORAL | Status: DC
  Administered 2014-11-17 – 2014-11-18 (×2): 5 mg via ORAL
  Filled 2014-11-17 (×2): qty 1

## 2014-11-17 MED ORDER — IOHEXOL 350 MG/ML SOLN
100.0000 mL | Freq: Once | INTRAVENOUS | Status: AC | PRN
  Administered 2014-11-17: 100 mL via INTRAVENOUS

## 2014-11-17 MED ORDER — NITROGLYCERIN 2 % TD OINT
1.0000 [in_us] | TOPICAL_OINTMENT | Freq: Once | TRANSDERMAL | Status: AC
  Administered 2014-11-17: 1 [in_us] via TOPICAL
  Filled 2014-11-17: qty 1

## 2014-11-17 MED ORDER — FUROSEMIDE 10 MG/ML IJ SOLN
40.0000 mg | Freq: Once | INTRAMUSCULAR | Status: AC
  Administered 2014-11-17: 40 mg via INTRAVENOUS
  Filled 2014-11-17: qty 4

## 2014-11-17 MED ORDER — ASPIRIN 81 MG PO CHEW
81.0000 mg | CHEWABLE_TABLET | Freq: Every day | ORAL | Status: DC
  Administered 2014-11-18 – 2014-11-19 (×2): 81 mg via ORAL
  Filled 2014-11-17 (×2): qty 1

## 2014-11-17 MED ORDER — ONDANSETRON HCL 4 MG/2ML IJ SOLN
4.0000 mg | Freq: Four times a day (QID) | INTRAMUSCULAR | Status: DC | PRN
Start: 2014-11-17 — End: 2014-11-21

## 2014-11-17 MED ORDER — FUROSEMIDE 10 MG/ML IJ SOLN
40.0000 mg | Freq: Two times a day (BID) | INTRAMUSCULAR | Status: DC
  Administered 2014-11-18: 40 mg via INTRAVENOUS
  Filled 2014-11-17: qty 4

## 2014-11-17 MED ORDER — SODIUM CHLORIDE 0.9 % IJ SOLN
3.0000 mL | Freq: Two times a day (BID) | INTRAMUSCULAR | Status: DC
  Administered 2014-11-18 – 2014-11-21 (×6): 3 mL via INTRAVENOUS

## 2014-11-17 MED ORDER — ASPIRIN 325 MG PO TABS
325.0000 mg | ORAL_TABLET | Freq: Once | ORAL | Status: AC
  Administered 2014-11-17: 325 mg via ORAL
  Filled 2014-11-17: qty 1

## 2014-11-17 MED ORDER — PANTOPRAZOLE SODIUM 40 MG PO TBEC
80.0000 mg | DELAYED_RELEASE_TABLET | Freq: Every day | ORAL | Status: DC
  Administered 2014-11-18 – 2014-11-21 (×4): 80 mg via ORAL
  Filled 2014-11-17 (×4): qty 2

## 2014-11-17 MED ORDER — PANTOPRAZOLE SODIUM 40 MG PO TBEC: 40.0000 mg | DELAYED_RELEASE_TABLET | Freq: Every day | ORAL | Status: DC

## 2014-11-17 MED ORDER — ONDANSETRON HCL 4 MG PO TABS: 4.0000 mg | ORAL_TABLET | Freq: Four times a day (QID) | ORAL | Status: DC | PRN

## 2014-11-17 MED ORDER — AMLODIPINE BESYLATE 5 MG PO TABS: 5.0000 mg | ORAL_TABLET | Freq: Every day | ORAL | Status: DC

## 2014-11-17 MED ORDER — ENOXAPARIN SODIUM 40 MG/0.4ML ~~LOC~~ SOLN
40.0000 mg | SUBCUTANEOUS | Status: DC
  Administered 2014-11-17 – 2014-11-18 (×2): 40 mg via SUBCUTANEOUS
  Filled 2014-11-17 (×2): qty 0.4

## 2014-11-17 MED ORDER — FUROSEMIDE 40 MG PO TABS: 40.0000 mg | ORAL_TABLET | Freq: Every day | ORAL | Status: DC

## 2014-11-17 MED ORDER — ENOXAPARIN SODIUM 40 MG/0.4ML ~~LOC~~ SOLN: 40.0000 mg | SUBCUTANEOUS | Status: DC

## 2014-11-17 NOTE — H&P (Signed)
[facility] History and Physical  Lindsay Whitehead MGN:[id number] DOB: 1945-07-15 DOA: 11/17/2014  Referring physician: ER PCP: No PCP Per Patient   Chief Complaint: Dyspnea. Chest pain.  HPI: Lindsay Whitehead is a 69 y.o. female  This is a 69 year old patient who is a patient of mine in the outpatient setting. She presents now with episodes of chest pain yesterday associated with dyspnea. She does have a history of hypertension, obesity, history of atrial fibrillation with rapid ventricular response in the past. She did have an echocardiogram approximately 4 months ago which showed normal ejection fraction and no evidence of diastolic dysfunction. There was mild LVH. Evaluation in the emergency room showed her to have an elevated d-dimer. CT angiogram of the chest did not show any evidence of pulmonary and was in but did show evidence of heart failure. She is now being admitted for further evaluation and management.   Review of Systems:  Apart from symptoms above, all systems negative.  Past Medical History  Diagnosis Date  . Vertigo   . Hypertension   . Episodic atrial fibrillation     Converted to NSR recent hospitalization 01/2013 on Dilt  . Atrial flutter   . Right bundle branch block     Appears to be chronic   Past Surgical History  Procedure Laterality Date  . Wrist fracture surgery     Social History:  reports that she has never smoked. She has never used smokeless tobacco. She reports that she drinks alcohol. She reports that she does not use illicit drugs.  Allergies  Allergen Reactions  . Bystolic [Nebivolol Hcl] Other (See Comments)    Extreme chest pains/ lowering of blood pressure causing dizziness, falling  . Metoprolol Other (See Comments)    Extreme drop in blood pressure, vertigo resulted  . Asa [Aspirin]     Bleeding gums  . Diltiazem     bleeding gums, stomach ache,weekness   . Edarbi [Azilsartan] Hives  . Lasix [Furosemide] Other (See  Comments)    Cramping in thigh area   . Zantac [Ranitidine Hcl]     Family History  Problem Relation Age of Onset  . Cancer Other   . Cardiomyopathy Brother     Congenital with need for heart transplant. Died at 60.    Prior to Admission medications   Medication Sig Start Date End Date Taking? Authorizing Provider  amLODipine (NORVASC) 5 MG tablet Take 5 mg by mouth daily.   Yes Historical Provider, MD  aspirin (ASPIRIN CHILDRENS) 81 MG chewable tablet Chew 1 tablet (81 mg total) by mouth daily. 07/19/14  Yes Elliot Cousin, MD  cholecalciferol (VITAMIN D) 1000 UNITS tablet Take 6,000 Units by mouth daily.   Yes Historical Provider, MD  esomeprazole (NEXIUM) 40 MG capsule Take 40 mg by mouth daily at 12 noon.   Yes Historical Provider, MD  furosemide (LASIX) 40 MG tablet Take 1 tablet (40 mg total) by mouth daily. 11/17/14   Bethann Berkshire, MD  potassium chloride SA (K-DUR,KLOR-CON) 10 MEQ tablet Take 1 tablet (10 mEq total) by mouth daily. 11/17/14   Bethann Berkshire, MD   Physical Exam: Filed Vitals:   11/17/14 1600 11/17/14 1630 11/17/14 1700 11/17/14 1735  BP: 152/96 137/64 166/85 171/98  Pulse: 72 66 71 73  Temp:      TempSrc:      Resp: 18 18 19 18   Height:      Weight:      SpO2: 86% 97% 92% 94%    Wt  Readings from Last 3 Encounters:  11/17/14 92.987 kg (205 lb)  07/19/14 90 kg (198 lb 6.6 oz)  08/25/13 91.173 kg (201 lb)    General:  Appears anxious but also somewhat short of breath at rest. However, she does not desaturate as I observe her. There is no peripheral or central cyanosis. Obese. Eyes: PERRL, normal lids, irises & conjunctiva ENT: grossly normal hearing, lips & tongue Neck: no LAD, masses or thyromegaly Cardiovascular: There is no gallop rhythm. Jugular venous pressure is not elevated. There is no peripheral pitting edema. Telemetry: SR, no arrhythmias  Respiratory: Lung fields are clinically clear with no evidence of crackles, wheezing or bronchial  breathing. Abdomen: soft, ntnd Skin: no rash or induration seen on limited exam Musculoskeletal: grossly normal tone BUE/BLE Psychiatric: grossly normal mood and affect, speech fluent and appropriate Neurologic: grossly non-focal.          Labs on Admission:  Basic Metabolic Panel:  Recent Labs Lab 11/17/14 1228  NA 140  K 3.9  CL 104  CO2 29  GLUCOSE 86  BUN 12  CREATININE 0.75  CALCIUM 8.9   Liver Function Tests:  Recent Labs Lab 11/17/14 1228  AST 50*  ALT 39  ALKPHOS 98  BILITOT 1.2  PROT 8.3*  ALBUMIN 4.5   No results for input(s): LIPASE, AMYLASE in the last 168 hours. No results for input(s): AMMONIA in the last 168 hours. CBC:  Recent Labs Lab 11/17/14 1228  WBC 6.4  NEUTROABS 4.4  HGB 12.6  HCT 39.5  MCV 87.6  PLT 244   Cardiac Enzymes:  Recent Labs Lab 11/17/14 1228  TROPONINI <0.03    BNP (last 3 results)  Recent Labs  11/17/14 1229  BNP 352.0*    ProBNP (last 3 results) No results for input(s): PROBNP in the last 8760 hours.  CBG: No results for input(s): GLUCAP in the last 168 hours.  Radiological Exams on Admission: Dg Chest 2 View  11/17/2014   CLINICAL DATA:  One day history of chest pain and shortness of breath  EXAM: CHEST  2 VIEW  COMPARISON:  January 20, 2013  FINDINGS: There is mild interstitial edema. There is no airspace consolidation. Heart is borderline enlarged with slight pulmonary venous hypertension. No adenopathy. There is atherosclerotic calcification in the aorta. There is degenerative change in the thoracic spine.  IMPRESSION: Evidence of a degree of congestive heart failure. No airspace consolidation or effusion.   Electronically Signed   By: Bretta Bang III M.D.   On: 11/17/2014 13:26   Ct Angio Chest Pe W/cm &/or Wo Cm  11/17/2014   CLINICAL DATA:  Short of breath, worse with exertion. Heart fluttering. Palpitations. Initial encounter.  EXAM: CT ANGIOGRAPHY CHEST WITH CONTRAST  TECHNIQUE:  Multidetector CT imaging of the chest was performed using the standard protocol during bolus administration of intravenous contrast. Multiplanar CT image reconstructions and MIPs were obtained to evaluate the vascular anatomy.  CONTRAST:  OMNIPAQUE IOHEXOL 350 MG/ML SOLN  COMPARISON:  11/17/2014.  FINDINGS: Bones: No aggressive osseous lesions. Thoracic vertebral body height is preserved.  Cardiovascular: Negative for pulmonary embolism. No acute aortic abnormality. Bolus dispersion is present over the study is diagnostic. Cardiomegaly is present. LEFT atrial enlargement.  Lungs: Dependent atelectasis. Apical interlobular septal thickening is present compatible with interstitial pulmonary edema.  4 mm LEFT upper lobe peripheral pulmonary nodule (image 31 series 5).  Central airways: Patent.  Effusions: Small bilateral pleural effusions. No pericardial effusion.  Lymphadenopathy: No axillary  adenopathy. No mediastinal or hilar adenopathy.  Esophagus: Small hiatal hernia.  Patulous gastroesophageal junction.  Upper abdomen: Normal.  Other: None.  Review of the MIP images confirms the above findings.  IMPRESSION: 1. Negative for pulmonary embolus or acute aortic abnormality. 2. Cardiomegaly, interstitial pulmonary edema and small bilateral pleural effusions compatible with mild CHF. 3. 4 mm LEFT upper lobe peripheral pulmonary nodule. If the patient is at high risk for bronchogenic carcinoma, follow-up chest CT at 1 year is recommended. If the patient is at low risk, no follow-up is needed. This recommendation follows the consensus statement: Guidelines for Management of Small Pulmonary Nodules Detected on CT Scans: A Statement from the Fleischner Society as published in Radiology 2005; 237:395-400.   Electronically Signed   By: Andreas Newport M.D.   On: 11/17/2014 16:58    EKG: Independently reviewed. I cannot see the full ECG on record.  Assessment/Plan   1. Congestive heart failure. Her signs do not  fit this diagnosis but CT scan is supportive of this. She'll be given intravenous Lasix. Repeat echocardiogram. Cardiology consultation.  Further recommendations will depend on patient's hospital progress.   Code Status: Full code.  DVT Prophylaxis: Lovenox.  Family Communication: I discussed the plan with the patient at the bedside   Disposition Plan: Home when medically stable.  Time spent: 60 minutes.  Wilson Singer [facility] Pager [phone].

## 2014-11-17 NOTE — ED Provider Notes (Addendum)
CSN: [id number]     Arrival date & time 11/17/14  1157 History  This chart was scribed for Lindsay Berkshire, MD by Tanda Rockers, ED Scribe. This patient was seen in room APA01/APA01 and the patient's care was started at 12:24 PM.  Chief Complaint  Patient presents with  . Shortness of Breath   Patient is a 69 y.o. female presenting with shortness of breath. The history is provided by the patient. No language interpreter was used.  Shortness of Breath Severity:  Severe Onset quality:  Gradual Duration:  2 days Timing:  Constant Chronicity:  New Context: activity   Relieved by:  None tried Worsened by:  Nothing tried Ineffective treatments:  None tried Associated symptoms: chest pain and neck pain   Associated symptoms: no abdominal pain, no cough, no headaches and no rash      HPI Comments: Lindsay Whitehead is a 69 y.o. female with hx atrial fibrillation who presents to the Emergency Department complaining of gradual onset, dyspnea on exertion x 2 days. Pt mentions she has had dyspnea on exertion in the past but it has never been this severe. Pt also complains of atrial fibrillation but does not believe she is in it any longer. Pt also began having chest pain radiating up to her neck last night while trying to sleep. Denies cough, nausea, vomiting, or any other associated symptoms.    Past Medical History  Diagnosis Date  . Vertigo   . Hypertension   . Episodic atrial fibrillation     Converted to NSR recent hospitalization 01/2013 on Dilt  . Atrial flutter   . Right bundle branch block     Appears to be chronic   Past Surgical History  Procedure Laterality Date  . Wrist fracture surgery     Family History  Problem Relation Age of Onset  . Cancer Other   . Cardiomyopathy Brother     Congenital with need for heart transplant. Died at 61.   Social History  Substance Use Topics  . Smoking status: Never Smoker   . Smokeless tobacco: Never Used  . Alcohol Use: 0.0 oz/week     4-5 Standard drinks or equivalent per week   OB History    Gravida Para Term Preterm AB TAB SAB Ectopic Multiple Living   3 3 3       2      Review of Systems  Constitutional: Negative for appetite change and fatigue.  HENT: Negative for congestion, ear discharge and sinus pressure.   Eyes: Negative for discharge.  Respiratory: Positive for shortness of breath. Negative for cough.   Cardiovascular: Positive for chest pain.  Gastrointestinal: Negative for abdominal pain and diarrhea.  Genitourinary: Negative for frequency and hematuria.  Musculoskeletal: Positive for neck pain. Negative for back pain.  Skin: Negative for rash.  Neurological: Negative for seizures and headaches.  Psychiatric/Behavioral: Negative for hallucinations.    Allergies  Bystolic; Metoprolol; Asa; Edarbi; Lasix; and Zantac  Home Medications   Prior to Admission medications   Medication Sig Start Date End Date Taking? Authorizing Provider  aspirin (ASPIRIN CHILDRENS) 81 MG chewable tablet Chew 1 tablet (81 mg total) by mouth daily. 07/19/14   Elliot Cousin, MD  Calcium-Magnesium-Vitamin D (CALCIUM MAGNESIUM PO) Take 3 tablets by mouth once a week.     Historical Provider, MD  diltiazem (CARDIZEM) 30 MG tablet Take 1 tablet (30 mg total) by mouth every 8 (eight) hours as needed (For a sustained heart rate of greater than 120 bpm.).  07/19/14   Elliot Cousin, MD  esomeprazole (NEXIUM) 40 MG capsule Take 40 mg by mouth daily at 12 noon.    Historical Provider, MD   Triage Vitals: BP 172/82 mmHg  Pulse 80  Temp(Src) 98.2 F (36.8 C) (Oral)  Resp 20  Ht  (1.702 m)  Wt 205 lb (92.987 kg)  BMI 32.10 kg/m2  SpO2 100%   Physical Exam  Constitutional: She is oriented to person, place, and time. She appears well-developed.  HENT:  Head: Normocephalic.  Eyes: Conjunctivae and EOM are normal. No scleral icterus.  Neck: Neck supple. No thyromegaly present.  Cardiovascular: Normal rate and regular rhythm.   Exam reveals no gallop and no friction rub.   No murmur heard. Pulmonary/Chest: No stridor. She has no wheezes. She has no rales. She exhibits no tenderness.  Abdominal: She exhibits no distension. There is no tenderness. There is no rebound.  Musculoskeletal: Normal range of motion. She exhibits no edema.  Lymphadenopathy:    She has no cervical adenopathy.  Neurological: She is oriented to person, place, and time. She exhibits normal muscle tone. Coordination normal.  Skin: No rash noted. No erythema.  Psychiatric: She has a normal mood and affect. Her behavior is normal.    ED Course  Procedures (including critical care time)  DIAGNOSTIC STUDIES: Oxygen Saturation is 100% on RA, normal by my interpretation.    COORDINATION OF CARE: 12:30 PM-Discussed treatment plan which includes CXR, CBC, CMP, Troponin with pt at bedside and pt agreed to plan.   Labs Review Labs Reviewed  CBC WITH DIFFERENTIAL/PLATELET  COMPREHENSIVE METABOLIC PANEL  TROPONIN I    Imaging Review No results found. I have personally reviewed and evaluated these images and lab results as part of my medical decision-making.   EKG Interpretation   Date/Time:  Monday November 17 2014 12:03:21 EDT Ventricular Rate:  80 PR Interval:  178 QRS Duration: 104 QT Interval:  444 QTC Calculation: 512 R Axis:   -36 Text Interpretation:  Normal sinus rhythm Left axis deviation Incomplete  right bundle branch block Cannot rule out Anterior infarct , age  undetermined ST \\T \ T wave abnormality, consider lateral ischemia  Prolonged QT Abnormal ECG lateral ST depression Confirmed by Manus Gunning  MD,  STEPHEN [phone]) on 11/17/2014 12:19:27 PM      MDM   Final diagnoses:  None   chf mild,  Will admit to telemetry and have cardiology consult in am   The chart was scribed for me under my direct supervision.  I personally performed the history, physical, and medical decision making and all procedures in the evaluation  of this patient.Lindsay Berkshire, MD 11/17/14 1727  Lindsay Berkshire, MD 11/17/14 1728  Lindsay Berkshire, MD 11/17/14 [phone]

## 2014-11-17 NOTE — ED Notes (Signed)
PT c/o SOB on exertion and starts heart fluttering at times x1 day. PT also states some chest pressure intermittently.

## 2014-11-17 NOTE — ED Notes (Signed)
Patient states for the last week she has had shortness of breath, worse with exertion. Shortness of breath started after she was started on Lisinopril. She called her PCP and reported symptoms and he changed her medication to [facility] but this did not help with the symptoms

## 2014-11-18 ENCOUNTER — Inpatient Hospital Stay (HOSPITAL_COMMUNITY): Payer: Medicare HMO

## 2014-11-18 DIAGNOSIS — I509 Heart failure, unspecified: Secondary | ICD-10-CM

## 2014-11-18 DIAGNOSIS — I4510 Unspecified right bundle-branch block: Secondary | ICD-10-CM

## 2014-11-18 DIAGNOSIS — I5031 Acute diastolic (congestive) heart failure: Secondary | ICD-10-CM

## 2014-11-18 LAB — CBC
HEMATOCRIT: 38.2 % (ref 36.0–46.0)
HEMOGLOBIN: 12.2 g/dL (ref 12.0–15.0)
MCH: 28 pg (ref 26.0–34.0)
MCHC: 31.9 g/dL (ref 30.0–36.0)
MCV: 87.6 fL (ref 78.0–100.0)
Platelets: 233 10*3/uL (ref 150–400)
RBC: 4.36 MIL/uL (ref 3.87–5.11)
RDW: 13.7 % (ref 11.5–15.5)
WBC: 4.2 10*3/uL (ref 4.0–10.5)

## 2014-11-18 LAB — COMPREHENSIVE METABOLIC PANEL
ALBUMIN: 3.9 g/dL (ref 3.5–5.0)
ALK PHOS: 87 U/L (ref 38–126)
ALT: 31 U/L (ref 14–54)
ANION GAP: 7 (ref 5–15)
AST: 36 U/L (ref 15–41)
BILIRUBIN TOTAL: 1.4 mg/dL — AB (ref 0.3–1.2)
BUN: 11 mg/dL (ref 6–20)
CALCIUM: 8.8 mg/dL — AB (ref 8.9–10.3)
CO2: 33 mmol/L — ABNORMAL HIGH (ref 22–32)
Chloride: 99 mmol/L — ABNORMAL LOW (ref 101–111)
Creatinine, Ser: 0.72 mg/dL (ref 0.44–1.00)
GFR calc Af Amer: >60 mL/min (ref >60)
GLUCOSE: 85 mg/dL (ref 65–99)
Potassium: 3.9 mmol/L (ref 3.5–5.1)
Sodium: 139 mmol/L (ref 135–145)
TOTAL PROTEIN: 7.3 g/dL (ref 6.5–8.1)

## 2014-11-18 LAB — BASIC METABOLIC PANEL
Anion gap: 8 (ref 5–15)
BUN: 12 mg/dL (ref 6–20)
CALCIUM: 8.6 mg/dL — AB (ref 8.9–10.3)
CO2: 31 mmol/L (ref 22–32)
Chloride: 97 mmol/L — ABNORMAL LOW (ref 101–111)
Creatinine, Ser: 0.77 mg/dL (ref 0.44–1.00)
GFR calc Af Amer: >60 mL/min (ref >60)
GLUCOSE: 94 mg/dL (ref 65–99)
Potassium: 3.7 mmol/L (ref 3.5–5.1)
SODIUM: 136 mmol/L (ref 135–145)

## 2014-11-18 LAB — TROPONIN I
Troponin I: <0.03 ng/mL (ref <0.031)
Troponin I: <0.03 ng/mL (ref <0.031)

## 2014-11-18 LAB — MAGNESIUM: Magnesium: 1.9 mg/dL (ref 1.7–2.4)

## 2014-11-18 LAB — BRAIN NATRIURETIC PEPTIDE: B NATRIURETIC PEPTIDE 5: 287 pg/mL — AB (ref 0.0–100.0)

## 2014-11-18 LAB — GLUCOSE, CAPILLARY: Glucose-Capillary: 102 mg/dL — ABNORMAL HIGH (ref 65–99)

## 2014-11-18 MED ORDER — FUROSEMIDE 10 MG/ML IJ SOLN
20.0000 mg | Freq: Two times a day (BID) | INTRAMUSCULAR | Status: DC
  Administered 2014-11-18 – 2014-11-19 (×2): 20 mg via INTRAVENOUS
  Filled 2014-11-18 (×2): qty 2

## 2014-11-18 MED ORDER — POTASSIUM CHLORIDE CRYS ER 20 MEQ PO TBCR
40.0000 meq | EXTENDED_RELEASE_TABLET | Freq: Once | ORAL | Status: AC
  Administered 2014-11-18: 40 meq via ORAL
  Filled 2014-11-18: qty 2

## 2014-11-18 NOTE — Consult Note (Signed)
CARDIOLOGY CONSULT NOTE   Patient ID: Rayven Sennott MRN: [id number] DOB/AGE: [birth year] 69 y.o.  Admit Date: 11/17/2014 Referring Physician: Leslee Home MD Primary Physician: No PCP Per Patient Consulting Cardiologist: Dina Rich MD Primary Cardiologist: branch, Christiane Ha MD Reason for Consultation: CHF  Clinical Summary Ms. Gassett is a 69 y.o.female with known history of atrial fib/flutter, CHADS VASC Score of 2-3, hypertension, who presented to ER with complaints of chest pain and dyspnea.   She is hard to keep on subject as she talks about a lot of different problems and moves from one subject to another, frequently. She apparently began to have abdominal swelling about 10 days ago with some dyspnea. She ignored this "it happens all the time." She flew to [place] to see her daughters and began to have worsening symptoms. Called her PCP who told her to take metoprolol, which he called in. This did not make any difference, and therefore, he called in Bystolic. She states that this did not help either, and gave her a headache. She stopping taking all of her medications for 4 days. When she returned home, she states that she could not lie flat without having PND. She finally took her Bystolic on Sunday. Her BP was elevated. She states that she began to feel better later on Sunday.   The symptoms returned the following day and she sought medical attention at an Urgent Care in [place]. She was sent to ER due to dyspnea. She states that she can tell when she is in atrial fib, because she feels weak and tired, but does not feel her heart beating irregularly. She states she feels fluttering in her stomach.    On arrival to ER, she was found to be hypertensive with BP of 172/82, HR 80, O2 sat 100%. Troponin negative X 3. Pro-BNP 352. She was not found to be anemic. Chemistries unremarkable. Mild elevation in AST but not ALT. D-dimer 2.40 but no evidence of PE on CT.  She was  found, however, to have interstitial pulmonary edema, and small bilateral pleural effusions. She was also noted to have a 4 mm left upper lobe pulmonary nodule. EKG was improperly scanned.   She was given IV lasix 40 mg X 1, NTG ointment, and ASA. She diureses 1.050 liters, but has also has also taken in 830 cc po. She continues IV lasix 40 mg BID.  She is not on diuretics at home. She is not adhering to a low sodium diet. She states she uses sea salt.     Allergies  Allergen Reactions  . Bystolic [Nebivolol Hcl] Other (See Comments)    Extreme chest pains/ lowering of blood pressure causing dizziness, falling  . Metoprolol Other (See Comments)    Extreme drop in blood pressure, vertigo resulted  . Asa [Aspirin]     Bleeding gums  . Diltiazem     bleeding gums, stomach ache,weekness   . Edarbi [Azilsartan] Hives  . Lasix [Furosemide] Other (See Comments)    Cramping in thigh area   . Zantac [Ranitidine Hcl]     Medications Scheduled Medications: . amLODipine  5 mg Oral Daily  . aspirin  81 mg Oral Daily  . cholecalciferol  6,000 Units Oral Daily  . enoxaparin (LOVENOX) injection  40 mg Subcutaneous Q24H  . furosemide  40 mg Intravenous Q12H  . pantoprazole  80 mg Oral Daily  . sodium chloride  3 mL Intravenous Q12H      PRN Medications: acetaminophen, ondansetron **OR** ondansetron (  ZOFRAN) IV   Past Medical History  Diagnosis Date  . Vertigo   . Hypertension   . Episodic atrial fibrillation     Converted to NSR recent hospitalization 01/2013 on Dilt  . Atrial flutter   . Right bundle branch block     Appears to be chronic    Past Surgical History  Procedure Laterality Date  . Wrist fracture surgery      Family History  Problem Relation Age of Onset  . Cancer Other   . Cardiomyopathy Brother     Congenital with need for heart transplant. Died at 50.    Social History Ms. Markey reports that she has never smoked. She has never used smokeless  tobacco. Ms. Burtis reports that she drinks alcohol.  Review of Systems Complete review of systems are found to be negative unless outlined in H&P above.  Physical Examination Blood pressure 130/68, pulse 64, temperature 98.3 F (36.8 C), temperature source Oral, resp. rate 18, height  (1.702 m), weight 198 lb 14.4 oz (90.22 kg), SpO2 95 %.  Intake/Output Summary (Last 24 hours) at 11/18/14 0813 Last data filed at 11/18/14 0643  Lennon Boutwell per 24 hour  Intake    590 ml  Output   1050 ml  Net   -460 ml    Telemetry: NSR  GEN: No acute distress HEENT: Conjunctiva and lids normal, oropharynx clear with moist mucosa. Neck: Supple, no elevated JVP or carotid bruits, no thyromegaly. Lungs: Clear to auscultation, nonlabored breathing at rest. Cardiac: Regular rate and rhythm, no S3 or significant systolic murmur, no pericardial rub. Abdomen: Soft, nontender, no hepatomegaly, bowel sounds present, no guarding or rebound. Extremities: No pitting edema, distal pulses 2+. Skin: Warm and dry. Musculoskeletal: No kyphosis. Neuropsychiatric: Alert and oriented x3, affect grossly appropriate.Difficult to keep on subject.   Prior Cardiac Testing/Procedures 1. Echocardiogram 07/19/2014 Left ventricle: The cavity size was normal. Wall thickness was increased in a pattern of mild LVH. The estimated ejection fraction was 60%. Wall motion was normal; there were no regional wall motion abnormalities. - Left atrium: The atrium was moderately dilated. - Right ventricle: The cavity size was normal. Systolic function was normal. - Right atrium: The atrium was mildly dilated.  Lab Results  Basic Metabolic Panel:  Recent Labs Lab 11/17/14 1228 11/18/14 0558  NA 140 139  K 3.9 3.9  CL 104 99*  CO2 29 33*  GLUCOSE 86 85  BUN 12 11  CREATININE 0.75 0.72  CALCIUM 8.9 8.8*    Liver Function Tests:  Recent Labs Lab 11/17/14 1228 11/18/14 0558  AST 50* 36  ALT 39 31   ALKPHOS 98 87  BILITOT 1.2 1.4*  PROT 8.3* 7.3  ALBUMIN 4.5 3.9    CBC:  Recent Labs Lab 11/17/14 1228 11/18/14 0558  WBC 6.4 4.2  NEUTROABS 4.4  --   HGB 12.6 12.2  HCT 39.5 38.2  MCV 87.6 87.6  PLT 244 233    Cardiac Enzymes:  Recent Labs Lab 11/17/14 1228 11/17/14 1806 11/17/14 2341 11/18/14 0558  TROPONINI <0.03 <0.03 <0.03 <0.03    Radiology: Dg Chest 2 View  11/17/2014   CLINICAL DATA:  One day history of chest pain and shortness of breath  EXAM: CHEST  2 VIEW  COMPARISON:  January 20, 2013  FINDINGS: There is mild interstitial edema. There is no airspace consolidation. Heart is borderline enlarged with slight pulmonary venous hypertension. No adenopathy. There is atherosclerotic calcification in the aorta. There is degenerative change  in the thoracic spine.  IMPRESSION: Evidence of a degree of congestive heart failure. No airspace consolidation or effusion.   Electronically Signed   By: Bretta Bang III M.D.   On: 11/17/2014 13:26   Ct Angio Chest Pe W/cm &/or Wo Cm  11/17/2014   CLINICAL DATA:  Short of breath, worse with exertion. Heart fluttering. Palpitations. Initial encounter.  EXAM: CT ANGIOGRAPHY CHEST WITH CONTRAST  TECHNIQUE: Multidetector CT imaging of the chest was performed using the standard protocol during bolus administration of intravenous contrast. Multiplanar CT image reconstructions and MIPs were obtained to evaluate the vascular anatomy.  CONTRAST:  OMNIPAQUE IOHEXOL 350 MG/ML SOLN  COMPARISON:  11/17/2014.  FINDINGS: Bones: No aggressive osseous lesions. Thoracic vertebral body height is preserved.  Cardiovascular: Negative for pulmonary embolism. No acute aortic abnormality. Bolus dispersion is present over the study is diagnostic. Cardiomegaly is present. LEFT atrial enlargement.  Lungs: Dependent atelectasis. Apical interlobular septal thickening is present compatible with interstitial pulmonary edema.  4 mm LEFT upper lobe peripheral  pulmonary nodule (image 31 series 5).  Central airways: Patent.  Effusions: Small bilateral pleural effusions. No pericardial effusion.  Lymphadenopathy: No axillary adenopathy. No mediastinal or hilar adenopathy.  Esophagus: Small hiatal hernia.  Patulous gastroesophageal junction.  Upper abdomen: Normal.  Other: None.  Review of the MIP images confirms the above findings.  IMPRESSION: 1. Negative for pulmonary embolus or acute aortic abnormality. 2. Cardiomegaly, interstitial pulmonary edema and small bilateral pleural effusions compatible with mild CHF. 3. 4 mm LEFT upper lobe peripheral pulmonary nodule. If the patient is at high risk for bronchogenic carcinoma, follow-up chest CT at 1 year is recommended. If the patient is at low risk, no follow-up is needed. This recommendation follows the consensus statement: Guidelines for Management of Small Pulmonary Nodules Detected on CT Scans: A Statement from the Fleischner Society as published in Radiology 2005; 237:395-400.   Electronically Signed   By: Andreas Newport M.D.   On: 11/17/2014 16:58     ECG: Will repeat.    Impression and Recommendations  1. Acute Pulmonary edema;  This was found incidentally on CT scan during evaluation for PE. She has been given IV lasix and has diuresed but is also taking in po fluid. Echo was ordered by PTH to assess for changes in her LV fx. Unknown dry wt. She states she has been gaining wt over two years and cannot tell if it from fluid.   2. Paroxysmal Atrial fib/flutter: She has history of this but is not on anticoagulation currently. CHADS Score of 2-3, and refused anticoagulation in the past. Would recommend Eliquis 5 mg BID. She had been on diltiazem in the past but now listed as being on amlodipine, but also states she took metoprolol while in [place]. She is currently in NSR. Rate is controlled currently without rate control medications. Continue ASA unless she agrees to NOAC, in which case, this should be  discontinued.   3. Abdominal Pain and bloating: She has mildly elevated LFT, which may have been from fluid overload. She still has her gallbladder, but Murphy;s sign was negative.   4. Questionable OSA: Body habitus and frequent waking at night. May contribute to afib. Consider this as OP if clinically relevant.   5. Lung nodule: Found incidentally on CT. Follow up as OP.     Signed: Bettey Mare. Lawrence NP [facility]  11/18/2014, 8:13 AM Co-Sign MD  Patient seen and discussed with NP Lawerence, I agree with her documentation  above. She is a 69 yo female with history of parox aflutter who has historically refused anticoag, HTN, admitted with SOB that has been progressing over the last few days along with orthopnea. She reports an atypical sharp chest pain on and off lasting just a few seconds over that same time period.    D-dimer 2.4, K 3.9, Cr 0.75, Hgb 12.6, Plt 244, TSH 2.3, 352 Trop neg x 4 CXR mild CHF CT PE: no PE, + edema and cardiomegaly.  EKG SR, RBBB, LAE. Diffuse ST depressions that are chronic Echo 06/2014 LVEF 60%, no WMAs, diastolic function not described  Patient admitted with CHF, feeling better with diuresis. Atypical chest pain, negative stress test in 2014. No evidence of ACS, she has chronic ST/T changes on EKG. No plan for ischemic workup if echo remains stable. F/u echo results, diurese with IV lasix again today. Likely discharge tomorrow  Dominga Ferry MD

## 2014-11-18 NOTE — Progress Notes (Signed)
[facility] PROGRESS NOTE  Lindsay Whitehead GNF:[id number] DOB: 01/18/1946 DOA: 11/17/2014 PCP: No PCP Per Patient  Assessment/Plan: 1. Acute CHF/likely diastolic -continue IV lasix,, cut down dose -FU ECHO -Cards following -follow I/O, weights  2. H/o P.Afib -in NSR now -CHadsVasc score is 3, start Anticoagulation, defer to Cards -takes ASA PRN  3. HTN -stable, continue amlodipine  4. Obesity -lifestyle modification  5. Lung Nodule -4mm, needs FU, non smoker   Code Status: Full COde Family Communication: spouse at bedside Disposition Plan: home tomorrow   Consultants: Cards  HPI/Subjective: Breathing better, abd less bloated  Objective: Filed Vitals:   11/18/14 0455  BP: 130/68  Pulse:   Temp: 98.3 F (36.8 C)  Resp: 18    Intake/Output Summary (Last 24 hours) at 11/18/14 0929 Last data filed at 11/18/14 0700  Gross per 24 hour  Intake    830 ml  Output   1050 ml  Net   -220 ml   Filed Weights   11/17/14 1208 11/17/14 1920 11/18/14 0455  Weight: 92.987 kg (205 lb) 91.128 kg (200 lb 14.4 oz) 90.22 kg (198 lb 14.4 oz)    Exam:   General:  AAOx3, no distress,obese  Cardiovascular:S1S2/RRR  Respiratory: CTAB  Abdomen: soft, NT, BS present  Musculoskeletal: no edema c/c  Data Reviewed: Basic Metabolic Panel:  Recent Labs Lab 11/17/14 1228 11/18/14 0558  NA 140 139  K 3.9 3.9  CL 104 99*  CO2 29 33*  GLUCOSE 86 85  BUN 12 11  CREATININE 0.75 0.72  CALCIUM 8.9 8.8*   Liver Function Tests:  Recent Labs Lab 11/17/14 1228 11/18/14 0558  AST 50* 36  ALT 39 31  ALKPHOS 98 87  BILITOT 1.2 1.4*  PROT 8.3* 7.3  ALBUMIN 4.5 3.9   No results for input(s): LIPASE, AMYLASE in the last 168 hours. No results for input(s): AMMONIA in the last 168 hours. CBC:  Recent Labs Lab 11/17/14 1228 11/18/14 0558  WBC 6.4 4.2  NEUTROABS 4.4  --   HGB 12.6 12.2  HCT 39.5 38.2  MCV 87.6 87.6  PLT 244 233   Cardiac  Enzymes:  Recent Labs Lab 11/17/14 1228 11/17/14 1806 11/17/14 2341 11/18/14 0558  TROPONINI <0.03 <0.03 <0.03 <0.03   BNP (last 3 results)  Recent Labs  11/17/14 1229 11/18/14 0558  BNP 352.0* 287.0*    ProBNP (last 3 results) No results for input(s): PROBNP in the last 8760 hours.  CBG: No results for input(s): GLUCAP in the last 168 hours.  No results found for this or any previous visit (from the past 240 hour(s)).   Studies: Dg Chest 2 View  11/17/2014   CLINICAL DATA:  One day history of chest pain and shortness of breath  EXAM: CHEST  2 VIEW  COMPARISON:  January 20, 2013  FINDINGS: There is mild interstitial edema. There is no airspace consolidation. Heart is borderline enlarged with slight pulmonary venous hypertension. No adenopathy. There is atherosclerotic calcification in the aorta. There is degenerative change in the thoracic spine.  IMPRESSION: Evidence of a degree of congestive heart failure. No airspace consolidation or effusion.   Electronically Signed   By: Bretta Bang III M.D.   On: 11/17/2014 13:26   Ct Angio Chest Pe W/cm &/or Wo Cm  11/17/2014   CLINICAL DATA:  Short of breath, worse with exertion. Heart fluttering. Palpitations. Initial encounter.  EXAM: CT ANGIOGRAPHY CHEST WITH CONTRAST  TECHNIQUE: Multidetector CT imaging of the chest was performed using  the standard protocol during bolus administration of intravenous contrast. Multiplanar CT image reconstructions and MIPs were obtained to evaluate the vascular anatomy.  CONTRAST:  OMNIPAQUE IOHEXOL 350 MG/ML SOLN  COMPARISON:  11/17/2014.  FINDINGS: Bones: No aggressive osseous lesions. Thoracic vertebral body height is preserved.  Cardiovascular: Negative for pulmonary embolism. No acute aortic abnormality. Bolus dispersion is present over the study is diagnostic. Cardiomegaly is present. LEFT atrial enlargement.  Lungs: Dependent atelectasis. Apical interlobular septal thickening is present  compatible with interstitial pulmonary edema.  4 mm LEFT upper lobe peripheral pulmonary nodule (image 31 series 5).  Central airways: Patent.  Effusions: Small bilateral pleural effusions. No pericardial effusion.  Lymphadenopathy: No axillary adenopathy. No mediastinal or hilar adenopathy.  Esophagus: Small hiatal hernia.  Patulous gastroesophageal junction.  Upper abdomen: Normal.  Other: None.  Review of the MIP images confirms the above findings.  IMPRESSION: 1. Negative for pulmonary embolus or acute aortic abnormality. 2. Cardiomegaly, interstitial pulmonary edema and small bilateral pleural effusions compatible with mild CHF. 3. 4 mm LEFT upper lobe peripheral pulmonary nodule. If the patient is at high risk for bronchogenic carcinoma, follow-up chest CT at 1 year is recommended. If the patient is at low risk, no follow-up is needed. This recommendation follows the consensus statement: Guidelines for Management of Small Pulmonary Nodules Detected on CT Scans: A Statement from the Fleischner Society as published in Radiology 2005; 237:395-400.   Electronically Signed   By: Andreas Newport M.D.   On: 11/17/2014 16:58    Scheduled Meds: . amLODipine  5 mg Oral Daily  . aspirin  81 mg Oral Daily  . cholecalciferol  6,000 Units Oral Daily  . enoxaparin (LOVENOX) injection  40 mg Subcutaneous Q24H  . furosemide  20 mg Intravenous Q12H  . pantoprazole  80 mg Oral Daily  . sodium chloride  3 mL Intravenous Q12H   Continuous Infusions:  Antibiotics Given (last 72 hours)    None      Active Problems:   Right bundle branch block   CHF (congestive heart failure)   Congestive heart failure    Time spent:    [facility]  [facility] Pager [phone]. If 7PM-7AM, please contact night-coverage at [email or web address], password [facility] 11/18/2014, 9:29 AM  LOS: 1 day

## 2014-11-18 NOTE — Progress Notes (Signed)
*  PRELIMINARY RESULTS* Echocardiogram 2D Echocardiogram has been performed.  Lindsay Whitehead 11/18/2014, 3:24 PM

## 2014-11-19 DIAGNOSIS — I480 Paroxysmal atrial fibrillation: Secondary | ICD-10-CM

## 2014-11-19 DIAGNOSIS — I4891 Unspecified atrial fibrillation: Secondary | ICD-10-CM

## 2014-11-19 DIAGNOSIS — I5021 Acute systolic (congestive) heart failure: Secondary | ICD-10-CM

## 2014-11-19 LAB — BASIC METABOLIC PANEL
Anion gap: 12 (ref 5–15)
BUN: 20 mg/dL (ref 6–20)
CHLORIDE: 98 mmol/L — AB (ref 101–111)
CO2: 27 mmol/L (ref 22–32)
CREATININE: 1.06 mg/dL — AB (ref 0.44–1.00)
Calcium: 8.7 mg/dL — ABNORMAL LOW (ref 8.9–10.3)
GFR calc Af Amer: >60 mL/min (ref >60)
GFR calc non Af Amer: 52 mL/min — ABNORMAL LOW (ref >60)
Glucose, Bld: 98 mg/dL (ref 65–99)
POTASSIUM: 3.8 mmol/L (ref 3.5–5.1)
Sodium: 137 mmol/L (ref 135–145)

## 2014-11-19 LAB — CBC
HCT: 42.1 % (ref 36.0–46.0)
HEMOGLOBIN: 13.8 g/dL (ref 12.0–15.0)
MCH: 28.1 pg (ref 26.0–34.0)
MCHC: 32.8 g/dL (ref 30.0–36.0)
MCV: 85.7 fL (ref 78.0–100.0)
PLATELETS: 269 10*3/uL (ref 150–400)
RBC: 4.91 MIL/uL (ref 3.87–5.11)
RDW: 13.7 % (ref 11.5–15.5)
WBC: 5.4 10*3/uL (ref 4.0–10.5)

## 2014-11-19 LAB — MRSA PCR SCREENING: MRSA by PCR: NEGATIVE

## 2014-11-19 MED ORDER — AMIODARONE HCL IN DEXTROSE 360-4.14 MG/200ML-% IV SOLN
60.0000 mg/h | INTRAVENOUS | Status: AC
  Administered 2014-11-19 (×2): 60 mg/h via INTRAVENOUS
  Filled 2014-11-19 (×2): qty 200

## 2014-11-19 MED ORDER — DILTIAZEM HCL 100 MG IV SOLR
5.0000 mg/h | INTRAVENOUS | Status: DC
  Administered 2014-11-19: 5 mg/h via INTRAVENOUS
  Filled 2014-11-19 (×2): qty 100

## 2014-11-19 MED ORDER — DILTIAZEM HCL 30 MG PO TABS
30.0000 mg | ORAL_TABLET | Freq: Four times a day (QID) | ORAL | Status: DC
  Administered 2014-11-19: 30 mg via ORAL
  Filled 2014-11-19: qty 1

## 2014-11-19 MED ORDER — AMIODARONE LOAD VIA INFUSION
150.0000 mg | Freq: Once | INTRAVENOUS | Status: AC
  Administered 2014-11-19: 150 mg via INTRAVENOUS
  Filled 2014-11-19: qty 83.34

## 2014-11-19 MED ORDER — AMIODARONE HCL IN DEXTROSE 360-4.14 MG/200ML-% IV SOLN
30.0000 mg/h | INTRAVENOUS | Status: DC
  Administered 2014-11-19: 30 mg/h via INTRAVENOUS
  Filled 2014-11-19: qty 200

## 2014-11-19 MED ORDER — APIXABAN 5 MG PO TABS
5.0000 mg | ORAL_TABLET | Freq: Two times a day (BID) | ORAL | Status: DC
  Administered 2014-11-19 – 2014-11-21 (×5): 5 mg via ORAL
  Filled 2014-11-19 (×5): qty 1

## 2014-11-19 NOTE — Care Management Note (Signed)
Case Management Note  Patient Details  Name: Lindsay Whitehead MRN: [id number] Date of Birth: 1945-05-17   Expected Discharge Date:  11/21/14               Expected Discharge Plan:  Home/Self Care  In-House Referral:  NA  Discharge planning Services  CM Consult  Post Acute Care Choice:  NA Choice offered to:  NA  DME Arranged:    DME Agency:     HH Arranged:    HH Agency:     Status of Service:  Completed, signed off  Medicare Important Message Given:    Date Medicare IM Given:    Medicare IM give by:    Date Additional Medicare IM Given:    Additional Medicare Important Message give by:     If discussed at Long Length of Stay Meetings, dates discussed:    Additional Comments: Pt is from home, lives with her husband and grandson. Pt is ind at baseline. Pt has no HH services, DME or med needs prior to admission. Pt plans to return home with self care at DC. Pt listed as having no PCP but it is documented in H &P that she is a pt of Dr. Patty Sermons. No CM needs identified.  Malcolm Metro, RN 11/19/2014, 2:31 PM

## 2014-11-19 NOTE — Progress Notes (Signed)
Subjective:    No SOB, no palpitations  Objective:   Temp:  [98.2 F (36.8 C)-99.4 F (37.4 C)] 98.6 F (37 C) (09/28 0325) Pulse Rate:  [32-116] 116 (09/28 0700) Resp:  [14-21] 17 (09/28 0700) BP: (107-139)/(50-101) 116/80 mmHg (09/28 0700) SpO2:  [90 %-100 %] 90 % (09/28 0700) Weight:  [197 lb 15.6 oz (89.8 kg)] 197 lb 15.6 oz (89.8 kg) (09/28 0325) Last BM Date: 11/17/14  Filed Weights   11/17/14 1920 11/18/14 0455 11/19/14 0325  Weight: 200 lb 14.4 oz (91.128 kg) 198 lb 14.4 oz (90.22 kg) 197 lb 15.6 oz (89.8 kg)    Intake/Output Summary (Last 24 hours) at 11/19/14 0819 Last data filed at 11/19/14 0600  Gross per 24 hour  Intake    203 ml  Output   1000 ml  Net   -797 ml    Telemetry: afib RVR  Exam:  General: NAD  Resp: CTAB  Cardiac: irreg, tachy, no m/r/g, no jVD  GI: abdomen soft, NT, ND  MSK: no LE edema  Neuro: no focal deficits  Psych: appropriate affect  Lab Results:  Basic Metabolic Panel:  Recent Labs Lab 11/17/14 1228 11/18/14 0558 11/18/14 0939  NA 140 139 136  K 3.9 3.9 3.7  CL 104 99* 97*  CO2 29 33* 31  GLUCOSE 86 85 94  BUN CREATININE 0.75 0.72 0.77  CALCIUM 8.9 8.8* 8.6*  MG  --   --  1.9    Liver Function Tests:  Recent Labs Lab 11/17/14 1228 11/18/14 0558  AST 50* 36  ALT 39 31  ALKPHOS 98 87  BILITOT 1.2 1.4*  PROT 8.3* 7.3  ALBUMIN 4.5 3.9    CBC:  Recent Labs Lab 11/17/14 1228 11/18/14 0558 11/19/14 0520  WBC 6.4 4.2 5.4  HGB 12.6 12.2 13.8  HCT 39.5 38.2 42.1  MCV 87.6 87.6 85.7  PLT 244 233 269    Cardiac Enzymes:  Recent Labs Lab 11/17/14 1806 11/17/14 2341 11/18/14 0558  TROPONINI <0.03 <0.03 <0.03    BNP: No results for input(s): PROBNP in the last 8760 hours.  Coagulation: No results for input(s): INR in the last 168 hours.  ECG:   Medications:   Scheduled Medications: . aspirin  81 mg Oral Daily  . cholecalciferol  6,000 Units Oral Daily  . enoxaparin  (LOVENOX) injection  40 mg Subcutaneous Q24H  . furosemide  20 mg Intravenous Q12H  . pantoprazole  80 mg Oral Daily  . sodium chloride  3 mL Intravenous Q12H     Infusions: . diltiazem (CARDIZEM) infusion 15 mg/hr (11/19/14 0600)     PRN Medications:  acetaminophen, ondansetron **OR** ondansetron (ZOFRAN) IV     Assessment/Plan    1. Acute on chronic diastolic HF - echo 11/18/14 LVEF 60-65%, abnormal diastolic function indeterminate grade, severe LAE - negative 1 liter since admission, she is on lasix  IV bid. No BMET today, ordered placed to add on AM draw.  - appears euvolemic, SOB resolved. Will d/c IV lasix - potentially exacerbated by uncontrolled afib  2. Afib RVR - elevated rates overnight, transferred to ICU and placed on dilt gtt. Rates remain elevated - prior side effects to beta blockers. Nonspecific side effects on dilt in the past, she reports it caused mouth ulcers and fatigue.  - not rate controlled on dilt gtt. Will change to amio gtt today - at d/c will have her f/u with EP [name] to reevaluate  long term treatment options.    Dina Rich, M.D.

## 2014-11-19 NOTE — Progress Notes (Signed)
Called by RN the patient went into A. fib with RVR, heart rate between 120s to 150s. Patient is completely asymptomatic. Discussed Cardizem, patient says she has a history of side effects with Cardizem, but willing to try tonight. We'll start Cardizem 30 mg by mouth every 6 hours. If no improvement patient will transfer to stepdown and started on IV Cardizem infusion. Patient currently on aspirin, cardiology recommended Eliquis, but patient is reluctant to start anticoagulation as she has a history of stomach ulcers. She will discuss the pros and the cons with cardiology in a.m. before considering NOAC.

## 2014-11-19 NOTE — Progress Notes (Signed)
[facility] PROGRESS NOTE  Lindsay Whitehead OHF:[id number] DOB: May 07, 1945    PCP:   No PCP Per Patient   HPI: Lindsay Whitehead is an 69 y.o. 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  She has decided to be anticoagulated.  Her HR is still elevated.  Cardiology has been following her.  No other complaints.   Rewiew of Systems:  Constitutional: Negative for malaise, fever and chills. No significant weight loss or weight gain Eyes: Negative for eye pain, redness and discharge, diplopia, visual changes, or flashes of light. ENMT: Negative for ear pain, hoarseness, nasal congestion, sinus pressure and sore throat. No headaches; tinnitus, drooling, or problem swallowing. Cardiovascular: Negative for chest pain, palpitations, diaphoresis,  and peripheral edema. ; No orthopnea, PND Respiratory: Negative for cough, hemoptysis, wheezing and stridor. No pleuritic chestpain. Gastrointestinal: Negative for nausea, vomiting, diarrhea, constipation, abdominal pain, melena, blood in stool, hematemesis, jaundice and rectal bleeding.    Genitourinary: Negative for frequency, dysuria, incontinence,flank pain and hematuria; Musculoskeletal: Negative for back pain and neck pain. Negative for swelling and trauma.;  Skin: . Negative for pruritus, rash, abrasions, bruising and skin lesion.; ulcerations Neuro: Negative for headache, lightheadedness and neck stiffness. Negative for weakness, altered level of consciousness , altered mental status, extremity weakness, burning feet, involuntary movement, seizure and syncope.  Psych: negative for anxiety, depression, insomnia, tearfulness, panic attacks, hallucinations, paranoia, suicidal or homicidal ideation    Past Medical History  Diagnosis Date  . Vertigo   . Hypertension   . Episodic atrial fibrillation     Converted to NSR recent hospitalization 01/2013 on Dilt  . Atrial  flutter   . Right bundle branch block     Appears to be chronic    Past Surgical History  Procedure Laterality Date  . Wrist fracture surgery      Medications:  HOME MEDS: Prior to Admission medications   Medication Sig Start Date End Date Taking? Authorizing Provider  amLODipine (NORVASC) 5 MG tablet Take 5 mg by mouth daily.   Yes Historical Provider, MD  aspirin (ASPIRIN CHILDRENS) 81 MG chewable tablet Chew 1 tablet (81 mg total) by mouth daily. 07/19/14  Yes Elliot Cousin, MD  cholecalciferol (VITAMIN D) 1000 UNITS tablet Take 6,000 Units by mouth daily.   Yes Historical Provider, MD  esomeprazole (NEXIUM) 40 MG capsule Take 40 mg by mouth daily at 12 noon.   Yes Historical Provider, MD  furosemide (LASIX) 40 MG tablet Take 1 tablet (40 mg total) by mouth daily. 11/17/14   Bethann Berkshire, MD  potassium chloride SA (K-DUR,KLOR-CON) 10 MEQ tablet Take 1 tablet (10 mEq total) by mouth daily. 11/17/14   Bethann Berkshire, MD     Allergies:  Allergies  Allergen Reactions  . Bystolic [Nebivolol Hcl] Other (See Comments)    Extreme chest pains/ lowering of blood pressure causing dizziness, falling  . Metoprolol Other (See Comments)    Extreme drop in blood pressure, vertigo resulted  . Asa [Aspirin]     Bleeding gums  . Diltiazem     bleeding gums, stomach ache,weekness   . Edarbi [Azilsartan] Hives  . Lasix [Furosemide] Other (See Comments)    Cramping in thigh area   . Zantac [Ranitidine Hcl]     Social History:   reports that she has never smoked. She has never used smokeless tobacco. She reports that she drinks alcohol. She reports that she does not use illicit  drugs.  Family History: Family History  Problem Relation Age of Onset  . Cancer Other   . Cardiomyopathy Brother     Congenital with need for heart transplant. Died at 68.     Physical Exam: Filed Vitals:   11/19/14 0600 11/19/14 0700 11/19/14 0800 11/19/14 0819  BP: 122/97 116/80 123/73   Pulse: 37 116 44    Temp:    98.1 F (36.7 C)  TempSrc:    Oral  Resp: Height:      Weight:      SpO2: 91% 90% 95%    Blood pressure 123/73, pulse 44, temperature 98.1 F (36.7 C), temperature source Oral, resp. rate 17, height 5' 7.5" (1.715 m), weight 89.8 kg (197 lb 15.6 oz), SpO2 95 %.  GEN:  Pleasant  patient lying in the stretcher in no acute distress; cooperative with exam. PSYCH:  alert and oriented x4; does not appear anxious or depressed; affect is appropriate. HEENT: Mucous membranes pink and anicteric; PERRLA; EOM intact; no cervical lymphadenopathy nor thyromegaly or carotid bruit; no JVD; There were no stridor. Neck is very supple. Breasts:: Not examined CHEST WALL: No tenderness CHEST: Normal respiration, clear to auscultation bilaterally.  HEART: Rapid, irregular.  There are no murmur, rub, or gallops.   BACK: No kyphosis or scoliosis; no CVA tenderness ABDOMEN: soft and non-tender; no masses, no organomegaly, normal abdominal bowel sounds; no pannus; no intertriginous candida. There is no rebound and no distention. Rectal Exam: Not done EXTREMITIES: No bone or joint deformity; age-appropriate arthropathy of the hands and knees; no edema; no ulcerations.  There is no calf tenderness. Genitalia: not examined PULSES: 2+ and symmetric SKIN: Normal hydration no rash or ulceration CNS: Cranial nerves 2-12 grossly intact no focal lateralizing neurologic deficit.  Speech is fluent; uvula elevated with phonation, facial symmetry and tongue midline. DTR are normal bilaterally, cerebella exam is intact, barbinski is negative and strengths are equaled bilaterally.  No sensory loss.   Labs on Admission:  Basic Metabolic Panel:  Recent Labs Lab 11/17/14 1228 11/18/14 0558 11/18/14 0939  NA 140 139 136  K 3.9 3.9 3.7  CL 104 99* 97*  CO2 29 33* 31  GLUCOSE 86 85 94  BUN CREATININE 0.75 0.72 0.77  CALCIUM 8.9 8.8* 8.6*  MG  --   --  1.9   Liver Function  Tests:  Recent Labs Lab 11/17/14 1228 11/18/14 0558  AST 50* 36  ALT 39 31  ALKPHOS 98 87  BILITOT 1.2 1.4*  PROT 8.3* 7.3  ALBUMIN 4.5 3.9   CBC:  Recent Labs Lab 11/17/14 1228 11/18/14 0558 11/19/14 0520  WBC 6.4 4.2 5.4  NEUTROABS 4.4  --   --   HGB 12.6 12.2 13.8  HCT 39.5 38.2 42.1  MCV 87.6 87.6 85.7  PLT 244 233 269   Cardiac Enzymes:  Recent Labs Lab 11/17/14 1228 11/17/14 1806 11/17/14 2341 11/18/14 0558  TROPONINI <0.03 <0.03 <0.03 <0.03    CBG:  Recent Labs Lab 11/18/14 2124  GLUCAP 102*     Radiological Exams on Admission: Dg Chest 2 View  11/17/2014   CLINICAL DATA:  One day history of chest pain and shortness of breath  EXAM: CHEST  2 VIEW  COMPARISON:  January 20, 2013  FINDINGS: There is mild interstitial edema. There is no airspace consolidation. Heart is borderline enlarged with slight pulmonary venous hypertension. No adenopathy. There is atherosclerotic calcification in the aorta.  There is degenerative change in the thoracic spine.  IMPRESSION: Evidence of a degree of congestive heart failure. No airspace consolidation or effusion.   Electronically Signed   By: Bretta Bang III M.D.   On: 11/17/2014 13:26   Ct Angio Chest Pe W/cm &/or Wo Cm  11/17/2014   CLINICAL DATA:  Short of breath, worse with exertion. Heart fluttering. Palpitations. Initial encounter.  EXAM: CT ANGIOGRAPHY CHEST WITH CONTRAST  TECHNIQUE: Multidetector CT imaging of the chest was performed using the standard protocol during bolus administration of intravenous contrast. Multiplanar CT image reconstructions and MIPs were obtained to evaluate the vascular anatomy.  CONTRAST:  OMNIPAQUE IOHEXOL 350 MG/ML SOLN  COMPARISON:  11/17/2014.  FINDINGS: Bones: No aggressive osseous lesions. Thoracic vertebral body height is preserved.  Cardiovascular: Negative for pulmonary embolism. No acute aortic abnormality. Bolus dispersion is present over the study is diagnostic.  Cardiomegaly is present. LEFT atrial enlargement.  Lungs: Dependent atelectasis. Apical interlobular septal thickening is present compatible with interstitial pulmonary edema.  4 mm LEFT upper lobe peripheral pulmonary nodule (image 31 series 5).  Central airways: Patent.  Effusions: Small bilateral pleural effusions. No pericardial effusion.  Lymphadenopathy: No axillary adenopathy. No mediastinal or hilar adenopathy.  Esophagus: Small hiatal hernia.  Patulous gastroesophageal junction.  Upper abdomen: Normal.  Other: None.  Review of the MIP images confirms the above findings.  IMPRESSION: 1. Negative for pulmonary embolus or acute aortic abnormality. 2. Cardiomegaly, interstitial pulmonary edema and small bilateral pleural effusions compatible with mild CHF. 3. 4 mm LEFT upper lobe peripheral pulmonary nodule. If the patient is at high risk for bronchogenic carcinoma, follow-up chest CT at 1 year is recommended. If the patient is at low risk, no follow-up is needed. This recommendation follows the consensus statement: Guidelines for Management of Small Pulmonary Nodules Detected on CT Scans: A Statement from the Fleischner Society as published in Radiology 2005; 237:395-400.   Electronically Signed   By: Andreas Newport M.D.   On: 11/17/2014 16:58     Assessment/Plan Present on Admission:  . Right bundle branch block Afib with RVR HTN Anxiety Acute on Chronic CHF.   PLAN:    Afib:  Per cardiology, amiodarone gtts, and begin Eliquis. D/C ASA.  Start empiric PPI.   Follow up with EP when discharge. HTN:  Controlled. Acute on chronic diastolic CHF:  Lasix has been d/c'ed.  She is euvolemic.   Other plans as per orders.  Code Status: FULL Unk Lightning, MD. [facility] Pager [phone] 7pm to 7am.  11/19/2014, 9:07 AM

## 2014-11-20 DIAGNOSIS — I5033 Acute on chronic diastolic (congestive) heart failure: Principal | ICD-10-CM

## 2014-11-20 MED ORDER — ACETAMINOPHEN 325 MG PO TABS
650.0000 mg | ORAL_TABLET | Freq: Four times a day (QID) | ORAL | Status: DC | PRN
  Administered 2014-11-20: 650 mg via ORAL
  Filled 2014-11-20: qty 2

## 2014-11-20 MED ORDER — AMIODARONE HCL 200 MG PO TABS
200.0000 mg | ORAL_TABLET | Freq: Two times a day (BID) | ORAL | Status: DC
  Administered 2014-11-20 – 2014-11-21 (×3): 200 mg via ORAL
  Filled 2014-11-20 (×3): qty 1

## 2014-11-20 MED ORDER — AMIODARONE HCL 200 MG PO TABS: 400.0000 mg | ORAL_TABLET | Freq: Two times a day (BID) | ORAL | Status: DC

## 2014-11-20 NOTE — Progress Notes (Signed)
Peripheral Iv restarted by Almira Coaster. #22 inserted without difficulty. Pt. Tol. Well.

## 2014-11-20 NOTE — Progress Notes (Signed)
Consulting cardiologist: Dina Rich MD Primary Cardiologist: Dina Rich MD  Cardiology Specific Problem List: 1. Atrial fibrillation-CHADS VASC Score 3 2. Acute on Chronic Diastolic CHF 3. Hypertension  Subjective:    Feels better, breathing better, wants to go home.   Objective:   Temp:  [97.7 F (36.5 C)-98.7 F (37.1 C)] 97.7 F (36.5 C) (09/29 0712) Pulse Rate:  [32-119] 51 (09/29 0600) Resp:  [12-32] 14 (09/29 0600) BP: (88-147)/(39-101) 128/70 mmHg (09/29 0600) SpO2:  [89 %-99 %] 95 % (09/29 0600) Weight:  [198 lb 10.2 oz (90.1 kg)] 198 lb 10.2 oz (90.1 kg) (09/29 0600) Last BM Date: 11/18/14  Filed Weights   11/18/14 0455 11/19/14 0325 11/20/14 0600  Weight: 198 lb 14.4 oz (90.22 kg) 197 lb 15.6 oz (89.8 kg) 198 lb 10.2 oz (90.1 kg)    Intake/Output Summary (Last 24 hours) at 11/20/14 0750 Last data filed at 11/20/14 0600  Gross per 24 hour  Intake 2378.3 ml  Output   1300 ml  Net 1078.3 ml    Telemetry: Sinus bradycardia 50'-60's.  Exam:  General: No acute distress.  HEENT: Conjunctiva and lids normal, oropharynx clear.  Lungs: Clear to auscultation, nonlabored.  Cardiac: No elevated JVP or bruits. RRR, no gallop or rub.   Abdomen: Normoactive bowel sounds, nontender, nondistended.  Extremities: No pitting edema, distal pulses full.  Neuropsychiatric: Alert and oriented x3, affect appropriate.   Lab Results:  Basic Metabolic Panel:  Recent Labs Lab 11/18/14 0558 11/18/14 0939 11/19/14 0520  NA 139 136 137  K 3.9 3.7 3.8  CL 99* 97* 98*  CO2 33* 31 27  GLUCOSE 85 94 98  BUN CREATININE 0.72 0.77 1.06*  CALCIUM 8.8* 8.6* 8.7*  MG  --  1.9  --     Liver Function Tests:  Recent Labs Lab 11/17/14 1228 11/18/14 0558  AST 50* 36  ALT 39 31  ALKPHOS 98 87  BILITOT 1.2 1.4*  PROT 8.3* 7.3  ALBUMIN 4.5 3.9    CBC:  Recent Labs Lab 11/17/14 1228 11/18/14 0558 11/19/14 0520  WBC 6.4 4.2 5.4  HGB  12.6 12.2 13.8  HCT 39.5 38.2 42.1  MCV 87.6 87.6 85.7  PLT 244 233 269    Cardiac Enzymes:  Recent Labs Lab 11/17/14 1806 11/17/14 2341 11/18/14 0558  TROPONINI <0.03 <0.03 <0.03    Radiology: CXR 11/17/2014  There is mild interstitial edema. There is no airspace consolidation. Heart is borderline enlarged with slight pulmonary venous hypertension. No adenopathy. There is atherosclerotic calcification in the aorta. There is degenerative change in the thoracic spine. IMPRESSION: Evidence of a degree of congestive heart failure. No airspace consolidation or effusion.  ECG: Sinus Bradycardia, RBBB, rate of 54 bpm.   Medications:   Scheduled Medications: . apixaban  5 mg Oral BID  . cholecalciferol  6,000 Units Oral Daily  . pantoprazole  80 mg Oral Daily  . sodium chloride  3 mL Intravenous Q12H    Infusions: . amiodarone 30 mg/hr (11/20/14 0600)     Assessment and Plan:   1. Paroxysmal Atrial fibrillation: Converted to NSR yesterday afternoon around 3:30 pm. She continues on amiodarone gtt and will have received 1 gm by 9 am this morning. Will d/c IV amiodarone at that time and transition to po amiodarone  po bid x 4 weeks then  daily.  She has accepted Eliquis for anticoagulation and is on 5 mg BID. CHADS VASC Score of 3. She  will need close follow up with cardiology as OP Appt is made for Dec 04, 2014 at 1:50 pm.   2. Hypertension; BP is now stable. She in only on amiodarone currently, she is not on ACE or amlodipine as she was taking at home. Would recommend close follow up for BP control on single agent.   3. Acute on Chronic Diastolic CHF: She appears well compensated.  4. OSA: Not formally diagnosed, but has sleeplessness, snoring, and exhaustion the next day. She states that she was told she had this but has not had sleep study. This should be addressed in OP setting as OSA contributes to  Atrial fib.     Bettey Mare. Lawrence NP [facility]  11/20/2014,  7:50 AM    Patient seen and discussed with NP Lyman Bishop, I agree with her documentation. Converted to sinus rhythm with amio gtt, will change over to oral. Due to some sinus bradycardia would favor low dose amio load with amio 200mg  bid x 4 weeks then 200mg  daily. Continue eliquis for stroke prevention.  I would also discharge with lasix 40mg  prn swelling, weight gain, or SOB. Ok for discharge from cardiology standpoint later morning early afternoon if maintains normal heart rates. She will f/u with [place] in 2 weeks, we will also arrange to have some samples of eliquis ready for her in our office to pick up today after discharge. I agree with outpatient OSA study, this can be arranged at her f/u.   Dominga Ferry MD

## 2014-11-20 NOTE — Progress Notes (Signed)
[facility] PROGRESS NOTE  Lindsay Whitehead EGB:[id number] DOB: Dec 29, 1945    PCP:   No PCP Per Patient   HPI:  Lindsay Whitehead is an 69 y.o. 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 She has decided to be anticoagulated.She converted to NSR on IV amiodarone and is being converted to oral Amiodarone today.  Appreciate cardiology's help.  No other complaints. She wanted to go home.    Rewiew of Systems:  Constitutional: Negative for malaise, fever and chills. No significant weight loss or weight gain Eyes: Negative for eye pain, redness and discharge, diplopia, visual changes, or flashes of light. ENMT: Negative for ear pain, hoarseness, nasal congestion, sinus pressure and sore throat. No headaches; tinnitus, drooling, or problem swallowing. Cardiovascular: Negative for chest pain, palpitations, diaphoresis, dyspnea and peripheral edema. ; No orthopnea, PND Respiratory: Negative for cough, hemoptysis, wheezing and stridor. No pleuritic chestpain. Gastrointestinal: Negative for nausea, vomiting, diarrhea, constipation, abdominal pain, melena, blood in stool, hematemesis, jaundice and rectal bleeding.    Genitourinary: Negative for frequency, dysuria, incontinence,flank pain and hematuria; Musculoskeletal: Negative for back pain and neck pain. Negative for swelling and trauma.;  Skin: . Negative for pruritus, rash, abrasions, bruising and skin lesion.; ulcerations Neuro: Negative for headache, lightheadedness and neck stiffness. Negative for weakness, altered level of consciousness , altered mental status, extremity weakness, burning feet, involuntary movement, seizure and syncope.  Psych: negative for anxiety, depression, insomnia, tearfulness, panic attacks, hallucinations, paranoia, suicidal or homicidal ideation   Past Medical History  Diagnosis Date  . Vertigo   . Hypertension   . Episodic atrial  fibrillation     Converted to NSR recent hospitalization 01/2013 on Dilt  . Atrial flutter   . Right bundle branch block     Appears to be chronic    Past Surgical History  Procedure Laterality Date  . Wrist fracture surgery      Medications:  HOME MEDS: Prior to Admission medications   Medication Sig Start Date End Date Taking? Authorizing Provider  amLODipine (NORVASC) 5 MG tablet Take 5 mg by mouth daily.   Yes Historical Provider, MD  aspirin (ASPIRIN CHILDRENS) 81 MG chewable tablet Chew 1 tablet (81 mg total) by mouth daily. 07/19/14  Yes Elliot Cousin, MD  cholecalciferol (VITAMIN D) 1000 UNITS tablet Take 6,000 Units by mouth daily.   Yes Historical Provider, MD  esomeprazole (NEXIUM) 40 MG capsule Take 40 mg by mouth daily at 12 noon.   Yes Historical Provider, MD  furosemide (LASIX) 40 MG tablet Take 1 tablet (40 mg total) by mouth daily. 11/17/14   Bethann Berkshire, MD  potassium chloride SA (K-DUR,KLOR-CON) 10 MEQ tablet Take 1 tablet (10 mEq total) by mouth daily. 11/17/14   Bethann Berkshire, MD     Allergies:  Allergies  Allergen Reactions  . Bystolic [Nebivolol Hcl] Other (See Comments)    Extreme chest pains/ lowering of blood pressure causing dizziness, falling  . Metoprolol Other (See Comments)    Extreme drop in blood pressure, vertigo resulted  . Asa [Aspirin]     Bleeding gums  . Diltiazem     bleeding gums, stomach ache,weekness   . Edarbi [Azilsartan] Hives  . Lasix [Furosemide] Other (See Comments)    Cramping in thigh area   . Zantac [Ranitidine Hcl]     Social History:   reports that she has never smoked. She has never used smokeless tobacco. She reports that  she drinks alcohol. She reports that she does not use illicit drugs.  Family History: Family History  Problem Relation Age of Onset  . Cancer Other   . Cardiomyopathy Brother     Congenital with need for heart transplant. Died at 76.     Physical Exam: Filed Vitals:   11/20/14 0600  11/20/14 0700 11/20/14 0712 11/20/14 0800  BP: 128/70 120/59  130/79  Pulse: 51 52  52  Temp:   97.7 F (36.5 C)   TempSrc:   Oral   Resp: Height:      Weight: 90.1 kg (198 lb 10.2 oz)     SpO2: 95% 97%  99%   Blood pressure 130/79, pulse 52, temperature 97.7 F (36.5 C), temperature source Oral, resp. rate 14, height 5' 7.5" (1.715 m), weight 90.1 kg (198 lb 10.2 oz), SpO2 99 %.  GEN:  Pleasant  patient lying in the stretcher in no acute distress; cooperative with exam. PSYCH:  alert and oriented x4; does not appear anxious or depressed; affect is appropriate. HEENT: Mucous membranes pink and anicteric; PERRLA; EOM intact; no cervical lymphadenopathy nor thyromegaly or carotid bruit; no JVD; There were no stridor. Neck is very supple. Breasts:: Not examined CHEST WALL: No tenderness CHEST: Normal respiration, clear to auscultation bilaterally.  HEART: Regular rate and rhythm.  There are no murmur, rub, or gallops.   BACK: No kyphosis or scoliosis; no CVA tenderness ABDOMEN: soft and non-tender; no masses, no organomegaly, normal abdominal bowel sounds; no pannus; no intertriginous candida. There is no rebound and no distention. Rectal Exam: Not done EXTREMITIES: No bone or joint deformity; age-appropriate arthropathy of the hands and knees; no edema; no ulcerations.  There is no calf tenderness. Genitalia: not examined PULSES: 2+ and symmetric SKIN: Normal hydration no rash or ulceration CNS: Cranial nerves 2-12 grossly intact no focal lateralizing neurologic deficit.  Speech is fluent; uvula elevated with phonation, facial symmetry and tongue midline. DTR are normal bilaterally, cerebella exam is intact, barbinski is negative and strengths are equaled bilaterally.  No sensory loss.   Labs on Admission:  Basic Metabolic Panel:  Recent Labs Lab 11/17/14 1228 11/18/14 0558 11/18/14 0939 11/19/14 0520  NA 140 139 136 137  K 3.9 3.9 3.7 3.8  CL 104 99* 97* 98*  CO2  29 33* 31 27  GLUCOSE 86 85 94 98  BUN CREATININE 0.75 0.72 0.77 1.06*  CALCIUM 8.9 8.8* 8.6* 8.7*  MG  --   --  1.9  --    Liver Function Tests:  Recent Labs Lab 11/17/14 1228 11/18/14 0558  AST 50* 36  ALT 39 31  ALKPHOS 98 87  BILITOT 1.2 1.4*  PROT 8.3* 7.3  ALBUMIN 4.5 3.9   No results for input(s): LIPASE, AMYLASE in the last 168 hours. No results for input(s): AMMONIA in the last 168 hours. CBC:  Recent Labs Lab 11/17/14 1228 11/18/14 0558 11/19/14 0520  WBC 6.4 4.2 5.4  NEUTROABS 4.4  --   --   HGB 12.6 12.2 13.8  HCT 39.5 38.2 42.1  MCV 87.6 87.6 85.7  PLT 244 233 269   Cardiac Enzymes:  Recent Labs Lab 11/17/14 1228 11/17/14 1806 11/17/14 2341 11/18/14 0558  TROPONINI <0.03 <0.03 <0.03 <0.03    CBG:  Recent Labs Lab 11/18/14 2124  GLUCAP 102*   Assessment/Plan Present on Admission:  . Right bundle branch block Afib with RVR. HTN Anxiety Diastolic CHF.  PLAN: Will transfer to the floor today.  Increase ambulation.  Continue anticoagulation with Eliquis and continue Amiodarone. Will plan to discharge tomorrow with close follow up.    Other plans as per orders.  Code Status: FULL Unk Lightning, MD. [facility] Pager [phone] 7pm to 7am.  11/20/2014, 8:57 AM

## 2014-11-20 NOTE — Discharge Instructions (Signed)
Call your md tomorrow am for an appointment Tuesday or wed    Information on my medicine - ELIQUIS (apixaban)  This medication education was reviewed with me or my healthcare representative as part of my discharge preparation.    Why was Eliquis prescribed for you? Eliquis was prescribed for you to reduce the risk of forming blood clots that can cause a stroke if you have a medical condition called atrial fibrillation (a type of irregular heartbeat) OR to reduce the risk of a blood clots forming after orthopedic surgery.  What do You need to know about Eliquis ? Take your Eliquis TWICE DAILY - one tablet in the morning and one tablet in the evening with or without food.  It would be best to take the doses about the same time each day.  If you have difficulty swallowing the tablet whole please discuss with your pharmacist how to take the medication safely.  Take Eliquis exactly as prescribed by your doctor and DO NOT stop taking Eliquis without talking to the doctor who prescribed the medication.  Stopping may increase your risk of developing a new clot or stroke.  Refill your prescription before you run out.  After discharge, you should have regular check-up appointments with your healthcare provider that is prescribing your Eliquis.  In the future your dose may need to be changed if your kidney function or weight changes by a significant amount or as you get older.  What do you do if you miss a dose? If you miss a dose, take it as soon as you remember on the same day and resume taking twice daily.  Do not take more than one dose of ELIQUIS at the same time.  Important Safety Information A possible side effect of Eliquis is bleeding. You should call your healthcare provider right away if you experience any of the following: ? Bleeding from an injury or your nose that does not stop. ? Unusual colored urine (red or dark brown) or unusual colored stools (red or black). ? Unusual  bruising for unknown reasons. ? A serious fall or if you hit your head (even if there is no bleeding).  Some medicines may interact with Eliquis and might increase your risk of bleeding or clotting while on Eliquis. To help avoid this, consult your healthcare provider or pharmacist prior to using any new prescription or non-prescription medications, including herbals, vitamins, non-steroidal anti-inflammatory drugs (NSAIDs) and supplements.  This website has more information on Eliquis (apixaban): [email or web address].cy.

## 2014-11-21 DIAGNOSIS — I5030 Unspecified diastolic (congestive) heart failure: Secondary | ICD-10-CM

## 2014-11-21 MED ORDER — APIXABAN 5 MG PO TABS: 5.0000 mg | ORAL_TABLET | Freq: Two times a day (BID) | ORAL | Status: DC

## 2014-11-21 MED ORDER — AMIODARONE HCL 200 MG PO TABS: 200.0000 mg | ORAL_TABLET | Freq: Two times a day (BID) | ORAL | Status: DC

## 2014-11-21 MED ORDER — ACETAMINOPHEN 325 MG PO TABS: 650.0000 mg | ORAL_TABLET | Freq: Four times a day (QID) | ORAL | Status: DC | PRN

## 2014-11-21 NOTE — Discharge Summary (Signed)
Physician Discharge Summary  Lindsay Whitehead QKM:[id number] DOB: 1945/12/18 DOA: 11/17/2014  PCP: No PCP Per Patient  Admit date: 11/17/2014 Discharge date: 11/21/2014  Time spent: 35 minutes  Recommendations for Outpatient Follow-up:  1. Follow up with Cardiology.  Appointment was made.  2. Follow up with PCP next week.  If you don't have one found, Dr Karilyn Cota was recommended.   Discharge Diagnoses:  Active Problems:   Right bundle branch block   CHF (congestive heart failure)   Congestive heart failure   Atrial fibrillation   Discharge Condition:  Improved.   Diet recommendation: Cardiac.   Filed Weights   11/19/14 0325 11/20/14 0600 11/21/14 0633  Weight: 89.8 kg (197 lb 15.6 oz) 90.1 kg (198 lb 10.2 oz) 90.13 kg (198 lb 11.2 oz)    History of present illness: Patient was admitted for SOB, chest pain, and was found to have afib with RVR.  As per Dr Esau Grew and P:  " Lindsay Whitehead is a 69 y.o. female  This is a 69 year old patient who is a patient of mine in the outpatient setting. She presents now with episodes of chest pain yesterday associated with dyspnea. She does have a history of hypertension, obesity, history of atrial fibrillation with rapid ventricular response in the past. She did have an echocardiogram approximately 4 months ago which showed normal ejection fraction and no evidence of diastolic dysfunction. There was mild LVH. Evaluation in the emergency room showed her to have an elevated d-dimer. CT angiogram of the chest did not show any evidence of pulmonary and was in but did show evidence of heart failure. She is now being admitted for further evaluation and management.   Hospital Course: patient was admitted into the ICU, and her afib with RVR was treated with IV Cardiazem.  She remained in afib, and her rapid HR was resistant to IV Cardiazem.  She was seen in consultation with Cardiology, who recommneded that she be anticoagulated.  She originally  refused, but after consideration, would like to be anticoagulated. Different anticoagulation regimens were discussed, and she chose Eliquis.  She was began on it.   With respect to her afib with RVR, she was subsequently give IV Amiodarone, and she converted to NSR.  With his, she felt better.  She r/out for her MI, and her IV Amiodarone was converted to oral Amiodarone.  She was subsequently transferred to telemetry, and ambulated well without tachycardia or hypoxia.  She is anxious to go home, and is stable for discharge.  Cardiology has made an appointment for her to follow up.  She was also given Lasix 40mg  per day PRN for fluid retention.  She will likely be referred to [name] of EP for further consideration of radioFx ablation.  It should be noted that she had an ECHO 4 months ago, with normal EF and no evidence of diastolic dysfx.  She also had a CTA in the ER with no evidence of pulmonary embolism.  Thank you for allowing me to participate in her care.  Good Day.     Consultations:  Cardiology.   Discharge Exam: Filed Vitals:   11/21/14 0633  BP: 144/63  Pulse: 65  Temp: 98.4 F (36.9 C)  Resp: 18     Discharge Instructions    Diet - low sodium heart healthy    Complete by:  As directed      Discharge instructions    Complete by:  As directed   You can take Lasix 40mg   per day AS NEEDED for fluid retention.   Be sure to follow up with your cardiologist for further medication management.     Increase activity slowly    Complete by:  As directed           Current Discharge Medication List    START taking these medications   Details  acetaminophen (TYLENOL) 325 MG tablet Take 2 tablets (650 mg total) by mouth every 6 (six) hours as needed for moderate pain or headache. Qty: 60 tablet, Refills: 1    amiodarone (PACERONE) 200 MG tablet Take 1 tablet (200 mg total) by mouth 2 (two) times daily. Qty: 60 tablet, Refills: 0    apixaban (ELIQUIS) 5 MG TABS tablet Take 1 tablet  (5 mg total) by mouth 2 (two) times daily. Qty: 60 tablet, Refills: 0    furosemide (LASIX) 40 MG tablet Take 1 tablet (40 mg total) by mouth daily. Qty: 10 tablet, Refills: 0    potassium chloride SA (K-DUR,KLOR-CON) 10 MEQ tablet Take 1 tablet (10 mEq total) by mouth daily. Qty: 10 tablet, Refills: 0      CONTINUE these medications which have NOT CHANGED   Details  amLODipine (NORVASC) 5 MG tablet Take 5 mg by mouth daily.    cholecalciferol (VITAMIN D) 1000 UNITS tablet Take 6,000 Units by mouth daily.    esomeprazole (NEXIUM) 40 MG capsule Take 40 mg by mouth daily at 12 noon.      STOP taking these medications     aspirin (ASPIRIN CHILDRENS) 81 MG chewable tablet        Allergies  Allergen Reactions  . Bystolic [Nebivolol Hcl] Other (See Comments)    Extreme chest pains/ lowering of blood pressure causing dizziness, falling  . Metoprolol Other (See Comments)    Extreme drop in blood pressure, vertigo resulted  . Asa [Aspirin]     Bleeding gums  . Diltiazem     bleeding gums, stomach ache,weekness   . Edarbi [Azilsartan] Hives  . Lasix [Furosemide] Other (See Comments)    Cramping in thigh area   . Zantac [Ranitidine Hcl]    Follow-up Information    Follow up with [name], NP On 12/04/2014.   Specialties:  Nurse Practitioner, Radiology, Cardiology   Why:  1:50   Contact information:   [address] [place] [address] [phone]        The results of significant diagnostics from this hospitalization (including imaging, microbiology, ancillary and laboratory) are listed below for reference.    Significant Diagnostic Studies: Dg Chest 2 View  11/17/2014   CLINICAL DATA:  One day history of chest pain and shortness of breath  EXAM: CHEST  2 VIEW  COMPARISON:  January 20, 2013  FINDINGS: There is mild interstitial edema. There is no airspace consolidation. Heart is borderline enlarged with slight pulmonary venous hypertension. No adenopathy. There  is atherosclerotic calcification in the aorta. There is degenerative change in the thoracic spine.  IMPRESSION: Evidence of a degree of congestive heart failure. No airspace consolidation or effusion.   Electronically Signed   By: Bretta Bang III M.D.   On: 11/17/2014 13:26   Ct Angio Chest Pe W/cm &/or Wo Cm  11/17/2014   CLINICAL DATA:  Short of breath, worse with exertion. Heart fluttering. Palpitations. Initial encounter.  EXAM: CT ANGIOGRAPHY CHEST WITH CONTRAST  TECHNIQUE: Multidetector CT imaging of the chest was performed using the standard protocol during bolus administration of intravenous contrast. Multiplanar CT image reconstructions  and MIPs were obtained to evaluate the vascular anatomy.  CONTRAST:  OMNIPAQUE IOHEXOL 350 MG/ML SOLN  COMPARISON:  11/17/2014.  FINDINGS: Bones: No aggressive osseous lesions. Thoracic vertebral body height is preserved.  Cardiovascular: Negative for pulmonary embolism. No acute aortic abnormality. Bolus dispersion is present over the study is diagnostic. Cardiomegaly is present. LEFT atrial enlargement.  Lungs: Dependent atelectasis. Apical interlobular septal thickening is present compatible with interstitial pulmonary edema.  4 mm LEFT upper lobe peripheral pulmonary nodule (image 31 series 5).  Central airways: Patent.  Effusions: Small bilateral pleural effusions. No pericardial effusion.  Lymphadenopathy: No axillary adenopathy. No mediastinal or hilar adenopathy.  Esophagus: Small hiatal hernia.  Patulous gastroesophageal junction.  Upper abdomen: Normal.  Other: None.  Review of the MIP images confirms the above findings.  IMPRESSION: 1. Negative for pulmonary embolus or acute aortic abnormality. 2. Cardiomegaly, interstitial pulmonary edema and small bilateral pleural effusions compatible with mild CHF. 3. 4 mm LEFT upper lobe peripheral pulmonary nodule. If the patient is at high risk for bronchogenic carcinoma, follow-up chest CT at 1 year is  recommended. If the patient is at low risk, no follow-up is needed. This recommendation follows the consensus statement: Guidelines for Management of Small Pulmonary Nodules Detected on CT Scans: A Statement from the Fleischner Society as published in Radiology 2005; 237:395-400.   Electronically Signed   By: Andreas Newport M.D.   On: 11/17/2014 16:58    Microbiology: Recent Results (from the past 240 hour(s))  MRSA PCR Screening     Status: None   Collection Time: 11/19/14  4:00 AM  Result Value Ref Range Status   MRSA by PCR NEGATIVE NEGATIVE Final    Comment:        The GeneXpert MRSA Assay (FDA approved for NASAL specimens only), is one component of a comprehensive MRSA colonization surveillance program. It is not intended to diagnose MRSA infection nor to guide or monitor treatment for MRSA infections.      Labs: Basic Metabolic Panel:  Recent Labs Lab 11/17/14 1228 11/18/14 0558 11/18/14 0939 11/19/14 0520  NA 140 139 136 137  K 3.9 3.9 3.7 3.8  CL 104 99* 97* 98*  CO2 29 33* 31 27  GLUCOSE 86 85 94 98  BUN CREATININE 0.75 0.72 0.77 1.06*  CALCIUM 8.9 8.8* 8.6* 8.7*  MG  --   --  1.9  --    Liver Function Tests:  Recent Labs Lab 11/17/14 1228 11/18/14 0558  AST 50* 36  ALT 39 31  ALKPHOS 98 87  BILITOT 1.2 1.4*  PROT 8.3* 7.3  ALBUMIN 4.5 3.9   CBC:  Recent Labs Lab 11/17/14 1228 11/18/14 0558 11/19/14 0520  WBC 6.4 4.2 5.4  NEUTROABS 4.4  --   --   HGB 12.6 12.2 13.8  HCT 39.5 38.2 42.1  MCV 87.6 87.6 85.7  PLT 244 233 269   Cardiac Enzymes:  Recent Labs Lab 11/17/14 1228 11/17/14 1806 11/17/14 2341 11/18/14 0558  TROPONINI <0.03 <0.03 <0.03 <0.03    Recent Labs  11/17/14 1229 11/18/14 0558  BNP 352.0* 287.0*    CBG:  Recent Labs Lab 11/18/14 2124  GLUCAP 102*    Signed:  LE,PETER  [facility] 11/21/2014, 10:36 AM

## 2014-11-21 NOTE — Care Management Note (Signed)
Case Management Note  Patient Details  Name: Lindsay Whitehead MRN: [id number] Date of Birth: 1945/10/29  Subjective/Objective:                    Action/Plan:   Expected Discharge Date:  11/21/14               Expected Discharge Plan:  Home/Self Care  In-House Referral:  NA  Discharge planning Services  CM Consult  Post Acute Care Choice:  NA Choice offered to:  NA  DME Arranged:    DME Agency:     HH Arranged:    HH Agency:     Status of Service:  Completed, signed off  Medicare Important Message Given:    Date Medicare IM Given:    Medicare IM give by:    Date Additional Medicare IM Given:    Additional Medicare Important Message give by:     If discussed at Long Length of Stay Meetings, dates discussed:    Additional Comments: Pt to be discharged home today. Pt has card for 30 days free of eliquis. Pt also notified of copay for eliquis starting next month. Encouraged pt to apply for pt assistance program with eliquis. Pt instructed if problems arise with eliquis to talk with her cardiologist or PCP to see what other options are available. Also discussed with pt importance of weighing daily, medication compliance, and dietary restrictions. Pt verbalized understanding. Pt and pts nurse aware of discharge arrangements. Arlyss Queen Bowler, RN 11/21/2014, 9:28 AM

## 2014-11-21 NOTE — Progress Notes (Signed)
No changes from cardiac standpoint. She remains in sinus rhythm on amiodarone 200mg  bid, on eliquis 5mg  bid for stroke prevention. Would also discharge on lasix 40mg  prn swelling, weight gain, or SOB. She already has f/u arranged with [place]. Will sign off inpatient care.    Dominga Ferry MD

## 2014-11-21 NOTE — Progress Notes (Signed)
Pt IV and telemetry removed, pt tolerated well.  Reviewed discharge instructions with pt and answered questions at this time. 

## 2014-11-21 NOTE — Care Management Important Message (Signed)
Important Message  Patient Details  Name: Lindsay Whitehead MRN: [id number] Date of Birth: [birth year]   Medicare Important Message Given:  Yes-second notification given    Cheryl Flash, RN 11/21/2014, 9:40 AM

## 2014-11-24 ENCOUNTER — Telehealth: Payer: Self-pay | Admitting: Adult Health

## 2014-11-24 NOTE — Telephone Encounter (Signed)
Patient states that she feels fine. Pt denies CP, SOB, and Wt gain at this time. Pt states she will monitor and call with changes.

## 2014-11-24 NOTE — Telephone Encounter (Signed)
Patient stated she saw [name], NP in the hospital last week and wanted to call today to let Samara Deist know that her heart rate started going up again yesterday. At the time of her call she said her HR was 118; two hours prior to her call it was 122, and it was 121 earlier this morning.   Patient is seeking advice. She is currently scheduled for a f/u with [name] on 12/04/14 @ 1:50pm.

## 2014-12-04 ENCOUNTER — Encounter: Payer: Self-pay | Admitting: Adult Health

## 2014-12-04 ENCOUNTER — Ambulatory Visit (INDEPENDENT_AMBULATORY_CARE_PROVIDER_SITE_OTHER): Payer: Medicare HMO | Admitting: Adult Health

## 2014-12-04 VITALS — BP 128/68 | HR 64 | Ht 67.5 in | Wt 201.0 lb

## 2014-12-04 DIAGNOSIS — G4730 Sleep apnea, unspecified: Secondary | ICD-10-CM

## 2014-12-04 DIAGNOSIS — I480 Paroxysmal atrial fibrillation: Secondary | ICD-10-CM | POA: Diagnosis not present

## 2014-12-04 MED ORDER — AMIODARONE HCL 200 MG PO TABS: 200.0000 mg | ORAL_TABLET | Freq: Every day | ORAL | Status: DC

## 2014-12-04 NOTE — Patient Instructions (Signed)
Your physician recommends that you schedule a follow-up appointment in:  After sleep study in about 6 weeks  Make an apt with Coumadin nurse Vashti Hey for mid November     DECREASE Amiodarone to 200 mg once a day     Your physician has recommended that you have a sleep study. This test records several body functions during sleep, including: brain activity, eye movement, oxygen and carbon dioxide blood levels, heart rate and rhythm, breathing rate and rhythm, the flow of air through your mouth and nose, snoring, body muscle movements, and chest and belly movement.      Thank you for choosing [facility] !

## 2014-12-04 NOTE — Progress Notes (Signed)
Cardiology Office Note   Date:  12/04/2014   ID:  Lindsay Whitehead, DOB 02/07/1946, MRN [id number]  PCP:  Wilson Singer, MD  Cardiologist: Arlington Calix, NP   Chief Complaint  Patient presents with  . Atrial Fibrillation      History of Present Illness: Lindsay Whitehead is a 69 y.o. female who presents for ongoing assessment and management of atrial fibrillation/flutter, CHADS VASC Score of 3, hypertension, with history of obesity, and probable obstructive sleep apnea.the patient was recently discharged from the hospital in the setting of acute pulmonary edema and atrial fib with RVR.  The patient had stopped taking medication, had some manipulated as an outpatient.  During hospitalization, the patient was placed on Eliquis, was started on IV amiodarone and converted to normal sinus rhythm.  She was placed on amiodarone 200 mg twice a dayand is here for followup concerning, heart rate control, and overall symptom management.  The patient had complaints of the cost of the anticoagulation therapy, stating that she cannot afford it.  She has one month left on samples.  She also has recurrent rapid heart rhythm, after she is asleep, usually waking her up.  Once she is awake and moving around.  It tends to subside.  She has been taking amiodarone twice a day.  However, she states that her heart rate is been dropping into the 40s and 50s and so.  She has dropped it down to once a day on her own.   Past Medical History  Diagnosis Date  . Vertigo   . Hypertension   . Episodic atrial fibrillation (HCC)     Converted to NSR recent hospitalization 01/2013 on [place]  . Atrial flutter (HCC)   . Right bundle branch block     Appears to be chronic    Past Surgical History  Procedure Laterality Date  . Wrist fracture surgery       Current Outpatient Prescriptions  Medication Sig Dispense Refill  . amiodarone (PACERONE) 200 MG tablet Take 1 tablet (200 mg total) by mouth 2 (two)  times daily. 60 tablet 0  . apixaban (ELIQUIS) 5 MG TABS tablet Take 1 tablet (5 mg total) by mouth 2 (two) times daily. 60 tablet 0  . cholecalciferol (VITAMIN D) 1000 UNITS tablet Take 6,000 Units by mouth daily.    Marland Kitchen esomeprazole (NEXIUM) 40 MG capsule Take 40 mg by mouth daily at 12 noon.     No current facility-administered medications for this visit.    Allergies:   Bystolic; Metoprolol; Asa; Diltiazem; Edarbi; Lasix; and Zantac    Social History:  The patient  reports that she has never smoked. She has never used smokeless tobacco. She reports that she drinks about 2.4 - 3.0 oz of alcohol per week. She reports that she does not use illicit drugs.   Family History:  The patient's family history includes Cancer in her other; Cardiomyopathy in her brother.    ROS: All other systems are reviewed and negative. Unless otherwise mentioned in H&P    PHYSICAL EXAM: VS:  BP 128/68 mmHg  Pulse 64  Ht 5' 7.5" (1.715 m)  Wt 201 lb (91.173 kg)  BMI 31.00 kg/m2  SpO2 96% , BMI Body mass index is 31 kg/(m^2). GEN: Well nourished, well developed, in no acute distress HEENT: normal Neck: no JVD, carotid bruits, or masses Cardiac: RRR; no murmurs, rubs, or gallops,no edema  Respiratory:  clear to auscultation bilaterally, normal work of breathing GI: soft, nontender, nondistended, +  BS MS: no deformity or atrophy Skin: warm and dry, no rash Neuro:  Strength and sensation are intact Psych: euthymic mood, full affect   Recent Labs: 11/17/2014: TSH 2.298 11/18/2014: ALT 31; B Natriuretic Peptide 287.0*; Magnesium 1.9 11/19/2014: BUN 20; Creatinine, Ser 1.06*; Hemoglobin 13.8; Platelets 269; Potassium 3.8; Sodium 137    Lipid Panel No results found for: CHOL, TRIG, HDL, CHOLHDL, VLDL, LDLCALC, LDLDIRECT    Wt Readings from Last 3 Encounters:  12/04/14 201 lb (91.173 kg)  11/21/14 198 lb 11.2 oz (90.13 kg)  07/19/14 198 lb 6.6 oz (90 kg)      ASSESSMENT AND PLAN:  1.  Paroxysmal  atrial fibrillation: Heart is currently well-controlled on amiodarone.  The patient dropped the dose of amiodarone from 200 mg twice a day to 200 mg daily, and she states her heart rate drops into the 40s, and she feels very tired.  However, she also states that her heart rate goes very fast after she takes a nap during the day or upon awakening in the morning.  I will place her on a once a day, amiodarone for now.  The patient will be sent for a sleep study to evaluate for obstructive sleep apnea in the setting of recurrent atrial fibrillation and frequent oxygen desaturations during hospitalization.  She will be referred for a CPAP machine.  Should this be clearly documented that she is having sleep apnea, which is also probably contributing to her atrial fibrillation.  She is not willing to take the Eliquis as she states that it is $458 and she cannot afford it.  She states that this was after her insurance paid.  I have discussed the risks and benefits of anticoagulation and have convinced her that we can change her to Coumadin therapy.  Instead.  She has one more month on the [facility] that she has artery.  Received, I will have her follow up in our Coumadin clinic to be established and to be started on new medication.  I have explained to her, that she will need to have lab work completed periodically to make sure that her INR is within the limits, especially since she is on amiodarone.  She verbalizes understanding.  We will see her back after sleep study to discuss results and possibly refer her for a CPAP  2. CHF: no evidence of decompensation at this time.  This was found during hospitalization in the setting of A. Fib RVR.  She is not on any diuretics at this time.  Current medicines are reviewed at length with the patient today.    Labs/ tests ordered today include: sleep study. No orders of the defined types were placed in this encounter.     Disposition:   FU with 1 month to 6  weeks.   Signed, [name], NP  12/04/2014 2:21 PM     [facility] [address], [place], [place] [address] Phone: [phone]; Fax: [phone]

## 2014-12-04 NOTE — Progress Notes (Deleted)
Name: Lindsay Whitehead    DOB: 20-Jul-1945  Age: 69 y.o.  MR#: [id number]       PCP:  No PCP Per Patient      Insurance: Payor: Monia Pouch MEDICARE / Plan: AET[employer] MEDICARE HMO/PPO / Product Type: *No Product type* /   CC:   No chief complaint on file.   VS Filed Vitals:   12/04/14 1354  BP: 128/68  Pulse: 64  Height: 5' 7.5" (1.715 m)  Weight: 201 lb (91.173 kg)  SpO2: 96%    Weights Current Weight  12/04/14 201 lb (91.173 kg)  11/21/14 198 lb 11.2 oz (90.13 kg)  07/19/14 198 lb 6.6 oz (90 kg)    Blood Pressure  BP Readings from Last 3 Encounters:  12/04/14 128/68  11/21/14 144/63  07/19/14 112/51     Admit date:  (Not on file) Last encounter with RMR:  11/24/2014   Allergy Bystolic; Metoprolol; Asa; Diltiazem; Edarbi; Lasix; and Zantac  Current Outpatient Prescriptions  Medication Sig Dispense Refill  . amiodarone (PACERONE) 200 MG tablet Take 1 tablet (200 mg total) by mouth 2 (two) times daily. 60 tablet 0  . apixaban (ELIQUIS) 5 MG TABS tablet Take 1 tablet (5 mg total) by mouth 2 (two) times daily. 60 tablet 0  . cholecalciferol (VITAMIN D) 1000 UNITS tablet Take 6,000 Units by mouth daily.    Marland Kitchen esomeprazole (NEXIUM) 40 MG capsule Take 40 mg by mouth daily at 12 noon.     No current facility-administered medications for this visit.    Discontinued Meds:    Medications Discontinued During This Encounter  Medication Reason  . potassium chloride SA (K-DUR,KLOR-CON) 10 MEQ tablet Error  . furosemide (LASIX) 40 MG tablet Error  . amLODipine (NORVASC) 5 MG tablet Error  . acetaminophen (TYLENOL) 325 MG tablet Error    Patient Active Problem List   Diagnosis Date Noted  . Atrial fibrillation (HCC) 11/19/2014  . CHF (congestive heart failure) (HCC) 11/17/2014  . Congestive heart failure (HCC) 11/17/2014  . Right bundle branch block 07/19/2014  . Atrial fibrillation with RVR (HCC) 07/18/2014  . Atrial flutter with rapid ventricular response (HCC) 01/20/2013  .  Elevated blood pressure 01/20/2013  . Depression 01/20/2013    LABS    Component Value Date/Time   NA 137 11/19/2014 0520   NA 136 11/18/2014 0939   NA 139 11/18/2014 0558   K 3.8 11/19/2014 0520   K 3.7 11/18/2014 0939   K 3.9 11/18/2014 0558   CL 98* 11/19/2014 0520   CL 97* 11/18/2014 0939   CL 99* 11/18/2014 0558   CO2 27 11/19/2014 0520   CO2 31 11/18/2014 0939   CO2 33* 11/18/2014 0558   GLUCOSE 98 11/19/2014 0520   GLUCOSE 94 11/18/2014 0939   GLUCOSE 85 11/18/2014 0558   BUN 20 11/19/2014 0520   BUN 12 11/18/2014 0939   BUN 11 11/18/2014 0558   CREATININE 1.06* 11/19/2014 0520   CREATININE 0.77 11/18/2014 0939   CREATININE 0.72 11/18/2014 0558   CALCIUM 8.7* 11/19/2014 0520   CALCIUM 8.6* 11/18/2014 0939   CALCIUM 8.8* 11/18/2014 0558   GFRNONAA 52* 11/19/2014 0520   GFRNONAA >60 11/18/2014 0939   GFRNONAA >60 11/18/2014 0558   GFRAA >60 11/19/2014 0520   GFRAA >60 11/18/2014 0939   GFRAA >60 11/18/2014 0558   CMP     Component Value Date/Time   NA 137 11/19/2014 0520   K 3.8 11/19/2014 0520   CL 98* 11/19/2014 0520  CO2 27 11/19/2014 0520   GLUCOSE 98 11/19/2014 0520   BUN 20 11/19/2014 0520   CREATININE 1.06* 11/19/2014 0520   CALCIUM 8.7* 11/19/2014 0520   PROT 7.3 11/18/2014 0558   ALBUMIN 3.9 11/18/2014 0558   AST 36 11/18/2014 0558   ALT 31 11/18/2014 0558   ALKPHOS 87 11/18/2014 0558   BILITOT 1.4* 11/18/2014 0558   GFRNONAA 52* 11/19/2014 0520   GFRAA >60 11/19/2014 0520       Component Value Date/Time   WBC 5.4 11/19/2014 0520   WBC 4.2 11/18/2014 0558   WBC 6.4 11/17/2014 1228   HGB 13.8 11/19/2014 0520   HGB 12.2 11/18/2014 0558   HGB 12.6 11/17/2014 1228   HCT 42.1 11/19/2014 0520   HCT 38.2 11/18/2014 0558   HCT 39.5 11/17/2014 1228   MCV 85.7 11/19/2014 0520   MCV 87.6 11/18/2014 0558   MCV 87.6 11/17/2014 1228    Lipid Panel  No results found for: CHOL, TRIG, HDL, CHOLHDL, VLDL, LDLCALC, LDLDIRECT  ABG No results  found for: PHART, PCO2ART, PO2ART, HCO3, TCO2, ACIDBASEDEF, O2SAT   Lab Results  Component Value Date   TSH 2.298 11/17/2014   BNP (last 3 results)  Recent Labs  11/17/14 1229 11/18/14 0558  BNP 352.0* 287.0*    ProBNP (last 3 results) No results for input(s): PROBNP in the last 8760 hours.  Cardiac Panel (last 3 results) No results for input(s): CKTOTAL, CKMB, TROPONINI, RELINDX in the last 72 hours.  Iron/TIBC/Ferritin/ %Sat No results found for: IRON, TIBC, FERRITIN, IRONPCTSAT   EKG Orders placed or performed during the hospital encounter of 11/17/14  . EKG 12-Lead  . EKG 12-Lead  . ED EKG  . ED EKG  . EKG 12-Lead  . EKG 12-Lead  . EKG  . EKG 12-Lead  . EKG 12-Lead  . EKG 12-Lead  . EKG 12-Lead     Prior Assessment and Plan Problem List as of 12/04/2014      Cardiovascular and Mediastinum   Atrial flutter with rapid ventricular response [facility])   Last Assessment & Plan 05/31/2013 Office Visit Written 06/03/2013  9:43 PM by Marinus Maw, MD    I have reviewed her ECG's and have discussed the risks/benefits/goals/expectations of catheter ablation of atrial flutter. She is considering her options and at this time is not inclined to proceed with ablation. In addition, she refuses anti-coagulation. I discussed why blood thinner were indicated, specifically that she was at risk for a stroke, but at this time she has refused.      Atrial fibrillation with RVR (HCC)   Right bundle branch block   CHF (congestive heart failure) (HCC)   Congestive heart failure (HCC)   Atrial fibrillation (HCC)     Other   Elevated blood pressure   Depression       Imaging: Dg Chest 2 View  11/17/2014  CLINICAL DATA:  One day history of chest pain and shortness of breath EXAM: CHEST  2 VIEW COMPARISON:  January 20, 2013 FINDINGS: There is mild interstitial edema. There is no airspace consolidation. Heart is borderline enlarged with slight pulmonary venous hypertension. No  adenopathy. There is atherosclerotic calcification in the aorta. There is degenerative change in the thoracic spine. IMPRESSION: Evidence of a degree of congestive heart failure. No airspace consolidation or effusion. Electronically Signed   By: Bretta Bang III M.D.   On: 11/17/2014 13:26   Ct Angio Chest Pe W/cm &/or Wo Cm  11/17/2014  CLINICAL DATA:  Short of breath, worse with exertion. Heart fluttering. Palpitations. Initial encounter. EXAM: CT ANGIOGRAPHY CHEST WITH CONTRAST TECHNIQUE: Multidetector CT imaging of the chest was performed using the standard protocol during bolus administration of intravenous contrast. Multiplanar CT image reconstructions and MIPs were obtained to evaluate the vascular anatomy. CONTRAST:  OMNIPAQUE IOHEXOL 350 MG/ML SOLN COMPARISON:  11/17/2014. FINDINGS: Bones: No aggressive osseous lesions. Thoracic vertebral body height is preserved. Cardiovascular: Negative for pulmonary embolism. No acute aortic abnormality. Bolus dispersion is present over the study is diagnostic. Cardiomegaly is present. LEFT atrial enlargement. Lungs: Dependent atelectasis. Apical interlobular septal thickening is present compatible with interstitial pulmonary edema. 4 mm LEFT upper lobe peripheral pulmonary nodule (image 31 series 5). Central airways: Patent. Effusions: Small bilateral pleural effusions. No pericardial effusion. Lymphadenopathy: No axillary adenopathy. No mediastinal or hilar adenopathy. Esophagus: Small hiatal hernia.  Patulous gastroesophageal junction. Upper abdomen: Normal. Other: None. Review of the MIP images confirms the above findings. IMPRESSION: 1. Negative for pulmonary embolus or acute aortic abnormality. 2. Cardiomegaly, interstitial pulmonary edema and small bilateral pleural effusions compatible with mild CHF. 3. 4 mm LEFT upper lobe peripheral pulmonary nodule. If the patient is at high risk for bronchogenic carcinoma, follow-up chest CT at 1 year is  recommended. If the patient is at low risk, no follow-up is needed. This recommendation follows the consensus statement: Guidelines for Management of Small Pulmonary Nodules Detected on CT Scans: A Statement from the Fleischner Society as published in Radiology 2005; 237:395-400. Electronically Signed   By: Andreas Newport M.D.   On: 11/17/2014 16:58

## 2015-05-20 ENCOUNTER — Telehealth: Payer: Self-pay | Admitting: Cardiology

## 2015-05-20 NOTE — Telephone Encounter (Signed)
Received records from [facility] for appointment on 07/30/15 with Dr Swaziland.  Records given to [facility] (medical records) for Dr Elvis Coil schedule on 07/30/15. lp

## 2015-07-30 ENCOUNTER — Encounter: Payer: Self-pay | Admitting: Cardiology

## 2015-07-30 ENCOUNTER — Ambulatory Visit (INDEPENDENT_AMBULATORY_CARE_PROVIDER_SITE_OTHER): Payer: Commercial Managed Care - HMO | Admitting: Cardiology

## 2015-07-30 VITALS — BP 176/90 | HR 59 | Ht 68.0 in | Wt 207.0 lb

## 2015-07-30 DIAGNOSIS — I5032 Chronic diastolic (congestive) heart failure: Secondary | ICD-10-CM | POA: Diagnosis not present

## 2015-07-30 DIAGNOSIS — I4510 Unspecified right bundle-branch block: Secondary | ICD-10-CM

## 2015-07-30 DIAGNOSIS — I4892 Unspecified atrial flutter: Secondary | ICD-10-CM | POA: Diagnosis not present

## 2015-07-30 DIAGNOSIS — I4891 Unspecified atrial fibrillation: Secondary | ICD-10-CM

## 2015-07-30 NOTE — Addendum Note (Signed)
Addended by: Neoma Laming on: 07/30/2015 06:29 PM   Modules accepted: Orders

## 2015-07-30 NOTE — Patient Instructions (Signed)
I will send a copy of my recommendations to Dr. Karilyn Cota  I would recommend you follow up with Dr. Wyline Mood for your cardiac care

## 2015-07-30 NOTE — Progress Notes (Signed)
Cardiology Office Note    Date:  07/30/2015   ID:  Lindsay Whitehead, DOB 1945/07/10, MRN [id number]  PCP:  Wilson Singer, MD  Cardiologist:  Dina Rich MD   History of Present Illness:  Lindsay Whitehead is a 70 y.o. female seen at the request of Dr. Karilyn Cota for a second opinion concerning her cardiac care. She is an established patient of Dr. Wyline Mood. She has a history of paroxysmal atrial fibrillation and flutter associated with diastolic CHF and pulmonary edema. She tells me that her troubles began about 5 years ago when she gained weight- over 35 lbs. Prior to this she states she was very active. Since then she is more SOB. In December 2014 she was admitted with atrial flutter with RVR. She converted on IV cardizem. She continued to have palpitations. An event monitor was ordered but never worn. She was seen by Dr. Lewayne Bunting In April 2015 for consideration of atrial flutter ablation but the patient refused. She was readmitted in May 2016 with recurrent Atrial flutter and again converted on cardizem. In September 2016 she was admitted with severe HTN, recurrent atrial flutter with RVR and pulmonary edema. She converted to NSR on IV amiodarone. She was DC on amiodarone and Eliquis. She has stopped taking both of these medications. She has been referred for sleep studies on more than one occasion but has never followed through. She has had numerous Echo studies done that demonstrate LVH with normal systolic function, LAE ranging from moderate to severe. No valvular abnormalities. Stress myoview study in December 2014 was low risk without ischemia and normal EF.  On follow up today she complains of intermittent palpitations consistent with her atrial flutter. This is more frequent if she is very active or straining. It is worse when she drinks caffeine. It may last all day. It is sporadic. She was place on a thyroid pill (although TSH normal) and this caused worsening palpitations. She  complains of fatigue. She complains of dyspnea on exertion. She has no chest pain and no edema.   She reports multiple drug intolerances. Per her records she previously stopped taking Eliquis due to cost. It was recommended she switch to Coumadin but she never went to the coumadin clinic. She tells me she stopped Eliquis because she saw blood in her stool and didn't want to bleed to death. She never had this evaluated. She reports amiodarone caused her toes to turn blue. She states beta blockers resulted in "extreme" drops in BP. Diltiazem caused weakness, stomach ache and bleeding gums. Edarbi caused hives. With this complaints she would just stop taking the medication. States she does not want to have an ablation because she might die or bleed to death on the table. She also doesn't want to take any blood thinner because of her concerns about bleeding to death but isn't really concerned about her risk of stroke. She tells me she knows she has sleep apnea but this has never been formally evaluated.    Past Medical History  Diagnosis Date  . Vertigo   . Hypertension   . Episodic atrial fibrillation (HCC)     Converted to NSR recent hospitalization 01/2013 on Dilt  . Atrial flutter (HCC)   . Right bundle branch block     Appears to be chronic  . Hypercholesterolemia   . Obesity   . Fatigue   . Diastolic CHF [facility])     Past Surgical History  Procedure Laterality Date  . Wrist fracture surgery    .  Carpel tunnel surgery      Current Medications: Outpatient Prescriptions Prior to Visit  Medication Sig Dispense Refill  . cholecalciferol (VITAMIN D) 1000 UNITS tablet Take 6,000 Units by mouth daily.    Marland Kitchen esomeprazole (NEXIUM) 40 MG capsule Take 40 mg by mouth daily at 12 noon.    Marland Kitchen amiodarone (PACERONE) 200 MG tablet Take 1 tablet (200 mg total) by mouth daily. 30 tablet 6  . apixaban (ELIQUIS) 5 MG TABS tablet Take 1 tablet (5 mg total) by mouth 2 (two) times daily. 60 tablet 0   No  facility-administered medications prior to visit.     Allergies:   Bystolic; Metoprolol; Asa; Diltiazem; Edarbi; Lasix; and Zantac   Social History   Social History  . Marital Status: Married    Spouse Name: N/A  . Number of Children: 3  . Years of Education: N/A   Occupational History  . retired Hotel manager   Social History Main Topics  . Smoking status: Never Smoker   . Smokeless tobacco: Never Used  . Alcohol Use: 2.4 - 3.0 oz/week    4-5 Standard drinks or equivalent per week  . Drug Use: No  . Sexual Activity: Yes    Birth Control/ Protection: None   Other Topics Concern  . None   Social History Narrative     Family History:  The patient's family history includes Asthma in her mother; COPD in her father; Cancer in her other; Cardiomyopathy in her brother; Hypertension in her mother.   ROS:   Please see the history of present illness.    ROS All other systems reviewed and are negative.   PHYSICAL EXAM:   VS:  BP 176/90 mmHg  Pulse 59  Ht 5\' 8"  (1.727 m)  Wt 207 lb (93.895 kg)  BMI 31.48 kg/m2   GEN: Well nourished, obese, in no acute distress HEENT: normal Neck: no JVD, carotid bruits, or masses Cardiac: RRR; no murmurs, rubs, or gallops,no edema. Normal S1-2 Respiratory:  clear to auscultation bilaterally, normal work of breathing GI: soft, nontender, nondistended, + BS MS: no deformity or atrophy Skin: warm and dry, no rash Neuro:  Alert and Oriented x 3, Strength and sensation are intact Psych: euthymic mood, full affect  Wt Readings from Last 3 Encounters:  07/30/15 207 lb (93.895 kg)  12/04/14 201 lb (91.173 kg)  11/21/14 198 lb 11.2 oz (90.13 kg)      Studies/Labs Reviewed:   EKG:  EKG is ordered today.  The ekg ordered today demonstrates NSR rate 59. LAD, RBBB. QTc 487 msec  Recent Labs: 11/17/2014: TSH 2.298 11/18/2014: ALT 31; B Natriuretic Peptide 287.0*; Magnesium 1.9 11/19/2014: BUN 20; Creatinine, Ser 1.06*;  Hemoglobin 13.8; Platelets 269; Potassium 3.8; Sodium 137   Lipid Panel No results found for: CHOL, TRIG, HDL, CHOLHDL, VLDL, LDLCALC, LDLDIRECT  Additional studies/ records that were reviewed today include:  Review of extensive hospital records as noted above.    ASSESSMENT:    1. Atrial fibrillation with RVR (HCC)   2. Atrial flutter with rapid ventricular response (HCC)   3. Chronic diastolic CHF (congestive heart failure) (HCC)   4. Right bundle branch block      PLAN:  In order of problems listed above:  1. Paroxysmal Atrial flutter. Patient continues to have intermittent palpitations.[place] Vasc score of 4. Annual risk of CVA 4%. HAS bled score of 1. I would recommend anticoagulation to reduce risk of stroke. I explained if she  has blood in her stool she needs to have this evaluated with endoscopy/colonoscopy. She refuses to take anticoagulation. She has consistently been noncompliant with recommended medical therapy including rate control or antiarrhythmic therapy for reasons that are not consistent or clear. Usually just stops medication on her own. Her atrial flutter is exacerbated by poor BP control. Sleep apnea may be a contributing factor. After extensive discussion of these issues I do not feel I can add anything to her therapy that has not been previously recommended.  2. HTN- poorly controlled. 3. Diastolic CHF chronic. Exacerbated when she has sustained atrial flutter with pulmonary edema. Volume status looks fair today on lasix but I am concerned that long term this will remain and issue. 4. Obesity with possible sleep apnea. Recommend she follow thru with formal sleep study and be treated if she has significant OSA as this may help reduce her Atrial flutter. Recommend regular aerobic exercise and weight loss.  5. RBBB chronic   The patient has poor insight into her medical condition and is not compliant with medical recommendations or follow through. Unless she is more  proactive with lifestyle modification and willing to work closely to manage these conditions I feel there is little I can offer. I recommend she continue to follow with Dr. Karilyn Cota and our office in [place].   Medication Adjustments/Labs and Tests Ordered: Current medicines are reviewed at length with the patient today.  Concerns regarding medicines are outlined above.  Medication changes, Labs and Tests ordered today are listed in the Patient Instructions below. Patient Instructions  I will send a copy of my recommendations to Dr. Karilyn Cota  I would recommend you follow up with Dr. Wyline Mood for your cardiac care     Signed, Peter Swaziland, MD  07/30/2015 5:21 PM    [facility] [address], [place], [place], [address] [phone]

## 2015-08-01 ENCOUNTER — Encounter (HOSPITAL_COMMUNITY): Payer: Self-pay | Admitting: Emergency Medicine

## 2015-08-01 ENCOUNTER — Emergency Department (HOSPITAL_COMMUNITY)
Admission: EM | Admit: 2015-08-01 | Discharge: 2015-08-01 | Disposition: A | Discharge status: Home / Self Care | Payer: Commercial Managed Care - HMO | Attending: Emergency Medicine | Admitting: Emergency Medicine

## 2015-08-01 DIAGNOSIS — I480 Paroxysmal atrial fibrillation: Secondary | ICD-10-CM | POA: Diagnosis not present

## 2015-08-01 DIAGNOSIS — Z6831 Body mass index (BMI) 31.0-31.9, adult: Secondary | ICD-10-CM | POA: Diagnosis not present

## 2015-08-01 DIAGNOSIS — Z79899 Other long term (current) drug therapy: Secondary | ICD-10-CM | POA: Insufficient documentation

## 2015-08-01 DIAGNOSIS — I509 Heart failure, unspecified: Secondary | ICD-10-CM | POA: Diagnosis not present

## 2015-08-01 DIAGNOSIS — E669 Obesity, unspecified: Secondary | ICD-10-CM | POA: Insufficient documentation

## 2015-08-01 DIAGNOSIS — I110 Hypertensive heart disease with heart failure: Secondary | ICD-10-CM | POA: Diagnosis not present

## 2015-08-01 DIAGNOSIS — Z7982 Long term (current) use of aspirin: Secondary | ICD-10-CM | POA: Insufficient documentation

## 2015-08-01 DIAGNOSIS — L292 Pruritus vulvae: Secondary | ICD-10-CM | POA: Diagnosis present

## 2015-08-01 DIAGNOSIS — N760 Acute vaginitis: Secondary | ICD-10-CM | POA: Diagnosis not present

## 2015-08-01 LAB — CBG MONITORING, ED: Glucose-Capillary: 95 mg/dL (ref 65–99)

## 2015-08-01 MED ORDER — CLOTRIMAZOLE-BETAMETHASONE 1-0.05 % EX CREA: TOPICAL_CREAM | CUTANEOUS | Status: DC

## 2015-08-01 MED ORDER — PREDNISONE 50 MG PO TABS: ORAL_TABLET | ORAL | Status: DC

## 2015-08-01 NOTE — ED Provider Notes (Signed)
CSN: [id number]     Arrival date & time 08/01/15  1457 History   First MD Initiated Contact with Patient 08/01/15 1622     Chief Complaint  Patient presents with  . Vaginal Itching     (Consider location/radiation/quality/duration/timing/severity/associated sxs/prior Treatment) HPI.Marland KitchenMarland KitchenMarland KitchenExternal vaginal blisters and itching for 2 years. Patient is married and has not been sexually active for 15 years. No previous history of herpes. She has tried over-the-counter products with minimal relief. Severity is moderate. She is alert and ambulatory without other complaints. No fever, chills, dysuria  Past Medical History  Diagnosis Date  . Vertigo   . Hypertension   . Episodic atrial fibrillation (HCC)     Converted to NSR recent hospitalization 01/2013 on Dilt  . Atrial flutter (HCC)   . Right bundle branch block     Appears to be chronic  . Hypercholesterolemia   . Obesity   . Fatigue   . Diastolic CHF [facility])    Past Surgical History  Procedure Laterality Date  . Wrist fracture surgery    . Carpel tunnel surgery     Family History  Problem Relation Age of Onset  . Cancer Other   . Cardiomyopathy Brother     Congenital with need for heart transplant. Died at 43.  . Asthma Mother   . Hypertension Mother   . COPD Father    Social History  Substance Use Topics  . Smoking status: Never Smoker   . Smokeless tobacco: Never Used  . Alcohol Use: 2.4 - 3.0 oz/week    4-5 Standard drinks or equivalent per week   OB History    Gravida Para Term Preterm AB TAB SAB Ectopic Multiple Living   Review of Systems  All other systems reviewed and are negative.     Allergies  Bystolic; Metoprolol; Asa; Diltiazem; Edarbi; Lasix; and Zantac  Home Medications   Prior to Admission medications   Medication Sig Start Date End Date Taking? Authorizing Provider  aspirin 81 MG tablet Take 81 mg by mouth daily.    Historical Provider, MD  cholecalciferol (VITAMIN D) 1000  UNITS tablet Take 6,000 Units by mouth daily.    Historical Provider, MD  clotrimazole-betamethasone (LOTRISONE) cream Apply to affected area 2 times daily prn 08/01/15   Donnetta Hutching, MD  esomeprazole (NEXIUM) 40 MG capsule Take 40 mg by mouth daily at 12 noon.    Historical Provider, MD  furosemide (LASIX) 40 MG tablet Take 40 mg by mouth as needed. 06/06/15   Historical Provider, MD  Multiple Vitamin (MULTIVITAMIN) capsule Take 1 capsule by mouth daily.    Historical Provider, MD  predniSONE (DELTASONE) 50 MG tablet One tab for 5 days 08/01/15   Donnetta Hutching, MD   BP 146/86 mmHg  Pulse 65  Temp(Src) 99.1 F (37.3 C) (Oral)  Resp 17  Ht  (1.727 m)  Wt 205 lb (92.987 kg)  BMI 31.18 kg/m2  SpO2 99% Physical Exam  Constitutional: She is oriented to person, place, and time. She appears well-developed and well-nourished.  HENT:  Head: Normocephalic and atraumatic.  Eyes: Conjunctivae and EOM are normal. Pupils are equal, round, and reactive to light.  Neck: Normal range of motion. Neck supple.  Pulmonary/Chest: Effort normal.  Abdominal: Soft.  Genitourinary:  External genitalia examined: At the introitus and labia are nonpainful ulcerations, erythema  Musculoskeletal: Normal range of motion.  Neurological: She is alert and oriented  to person, place, and time.  Skin: Skin is warm and dry.  Psychiatric: She has a normal mood and affect. Her behavior is normal.  Nursing note and vitals reviewed.   ED Course  Procedures (including critical care time) Labs Review Labs Reviewed - No data to display  Imaging Review No results found. I have personally reviewed and evaluated these images and lab results as part of my medical decision-making.   EKG Interpretation None      MDM   Final diagnoses:  Vaginitis    Uncertain etiology of external vaginal rash. Will Rx Lotrisone and prednisone. Patient strongly encouraged to get GYN follow-up. Phone number given. Possibility of cancer  discussed.    Donnetta Hutching, MD 08/01/15 1714

## 2015-08-01 NOTE — ED Notes (Signed)
Here vaginal itching and blisters.  Having itching on and off for last 2 years.

## 2015-08-01 NOTE — Discharge Instructions (Signed)
Vaginitis Vaginitis is an inflammation of the vagina. It can happen when the normal bacteria and yeast in the vagina grow too much. There are different types. Treatment will depend on the type you have. HOME CARE  Take all medicines as told by your doctor.  Keep your vagina area clean and dry. Avoid soap. Rinse the area with water.  Avoid washing and cleaning out the vagina (douching).  Do not use tampons or have sex (intercourse) until your treatment is done.  Wipe from front to back after going to the restroom.  Wear cotton underwear.  Avoid wearing underwear while you sleep until your vaginitis is gone.  Avoid tight pants. Avoid underwear or nylons without a cotton panel.  Take off wet clothing (such as a bathing suit) as soon as you can.  Use mild, unscented products. Avoid fabric softeners and scented:  Feminine sprays.  Laundry detergents.  Tampons.  Soaps or bubble baths.  Practice safe sex and use condoms. GET HELP RIGHT AWAY IF:   You have belly (abdominal) pain.  You have a fever or lasting symptoms for more than 2-3 days.  You have a fever and your symptoms suddenly get worse. MAKE SURE YOU:   Understand these instructions.  Will watch this condition.  Will get help right away if you are not doing well or get worse.   This information is not intended to replace advice given to you by your health care provider. Make sure you discuss any questions you have with your health care provider.   Document Released: 05/06/2008 Document Revised: 11/02/2011 Document Reviewed: 07/21/2011 Elsevier Interactive Patient Education 2016 [employer].   Prescription for ointment to apply twice a (small amount). Also prescription for prednisone. I recommend follow-up appointment with gynecologist. Phone number given. There is a possibility this could be cancer, so follow-up is extremely important.

## 2015-08-04 ENCOUNTER — Encounter: Payer: Self-pay | Admitting: Cardiology

## 2015-08-05 ENCOUNTER — Ambulatory Visit (INDEPENDENT_AMBULATORY_CARE_PROVIDER_SITE_OTHER): Payer: Commercial Managed Care - HMO | Admitting: Obstetrics & Gynecology

## 2015-08-05 VITALS — BP 150/80 | HR 74 | Ht 68.0 in | Wt 209.0 lb

## 2015-08-05 DIAGNOSIS — A6004 Herpesviral vulvovaginitis: Secondary | ICD-10-CM

## 2015-08-05 MED ORDER — VALACYCLOVIR HCL 1 G PO TABS: 1000.0000 mg | ORAL_TABLET | Freq: Two times a day (BID) | ORAL | Status: DC

## 2015-08-05 NOTE — Progress Notes (Signed)
Patient ID: Lindsay Whitehead, female   DOB: 07/09/1945, 70 y.o.   MRN: [id number]      Chief Complaint  Patient presents with  . new gyn visit    follow-up [facility] ER    Blood pressure 150/80, pulse 74, height 5\' 8"  (1.727 m), weight 209 lb (94.802 kg).  70 y.o. [id number] No LMP recorded. Patient is postmenopausal. The current method of family planning is post menopausal status.  Subjective Treatment) HPI.Marland KitchenMarland KitchenMarland KitchenExternal vaginal blisters and itching for 2 years. Patient is married and has not been sexually active for 15 years. No previous history of herpes. She has tried over-the-counter products with minimal relief. Severity is moderate. She is alert and ambulatory without other complaints. No fever, chills, dysuria  Objective Vulva erythematous ulcerative scattered lesions bilaterally, will need biopsy  Pertinent ROS No burning with urination, frequency or urgency No nausea, vomiting or diarrhea Nor fever chills or other constitutional symptoms  Labs or studies     Impression Diagnoses this Encounter::   ICD-9-CM ICD-10-CM   1. Type 2 HSV infection of vulvovaginal region 054.11 A60.04 HSV 2 antibody, IgG     HSV 1 antibody, IgG    Established relevant diagnosis(es):   Plan/Recommendations: Meds ordered this encounter  Medications  . valACYclovir (VALTREX) 1000 MG tablet    Sig: Take 1 tablet (1,000 mg total) by mouth 2 (two) times daily.    Dispense:  42 tablet    Refill:  1    Labs or Scans Ordered: Orders Placed This Encounter  Procedures  . HSV 2 antibody, IgG  . HSV 1 antibody, IgG    Management:: Course of valtrex  Needs vulvar biopsy at next appt  Follow up Return in about 2 weeks (around 08/19/2015) for Follow up, with Dr Despina Hidden.          All questions were answered.

## 2015-08-06 ENCOUNTER — Telehealth: Payer: Self-pay | Admitting: *Deleted

## 2015-08-06 LAB — HSV 1 ANTIBODY, IGG

## 2015-08-06 LAB — HSV 2 ANTIBODY, IGG: HSV 2 Glycoprotein G Ab, IgG: <0.91 index (ref 0.00–0.90)

## 2015-08-06 NOTE — Telephone Encounter (Signed)
Pt c/o vaginal itching, taking the Valtrex as prescribed.What cream do you recommend for vaginal itching?

## 2015-08-06 NOTE — Telephone Encounter (Signed)
Pt advised of Dr. Forestine Chute recommendation and verbalized understanding.

## 2015-08-06 NOTE — Telephone Encounter (Signed)
Right now I don't want to introduce another variable into this equation  So I would say right now i would prefer to not use anything, the itching means it is probably healing  Can take oral benadryl especially at bedtime

## 2015-08-10 ENCOUNTER — Telehealth: Payer: Self-pay | Admitting: Obstetrics & Gynecology

## 2015-08-10 NOTE — Telephone Encounter (Signed)
Pt informed to keep appointment and Dr. Despina Hidden will do biopsy.  Pt states she can not wait until next Wednesday for something to be done for the itching and irritation.  Pt advised to call back in the morning and see if she can get a sooner appointment.

## 2015-08-10 NOTE — Telephone Encounter (Signed)
Pt states she has finished the Prednizone that was given to her at the hospital and has stopped the Valtrex because it was aggrovating her AFIB.  Pt states she can not take Benadryl that you recommended last time she called because she "can't function at all" when uses it.  Pt states the itching is getting worse again and broke out in hives in pelvic area, she states when she urinates it stings very badly where she is so irritated.  Informed pt on negative HSV blood tests and she states that you were going to do some other testing if that test came back negative.  Please advise.

## 2015-08-10 NOTE — Telephone Encounter (Signed)
Keep appointment and will do a vulvar biopsy

## 2015-08-12 ENCOUNTER — Inpatient Hospital Stay (HOSPITAL_COMMUNITY)
Admission: EM | Admit: 2015-08-12 | Discharge: 2015-08-15 | DRG: 308 | Disposition: A | Discharge status: Home / Self Care | Payer: Commercial Managed Care - HMO | Attending: Internal Medicine | Admitting: Internal Medicine

## 2015-08-12 ENCOUNTER — Other Ambulatory Visit: Payer: Self-pay

## 2015-08-12 ENCOUNTER — Encounter (HOSPITAL_COMMUNITY): Payer: Self-pay

## 2015-08-12 ENCOUNTER — Emergency Department (HOSPITAL_COMMUNITY): Payer: Commercial Managed Care - HMO

## 2015-08-12 DIAGNOSIS — Z886 Allergy status to analgesic agent status: Secondary | ICD-10-CM

## 2015-08-12 DIAGNOSIS — D72829 Elevated white blood cell count, unspecified: Secondary | ICD-10-CM | POA: Diagnosis present

## 2015-08-12 DIAGNOSIS — E7800 Pure hypercholesterolemia, unspecified: Secondary | ICD-10-CM | POA: Diagnosis present

## 2015-08-12 DIAGNOSIS — Z9119 Patient's noncompliance with other medical treatment and regimen: Secondary | ICD-10-CM | POA: Diagnosis not present

## 2015-08-12 DIAGNOSIS — I10 Essential (primary) hypertension: Secondary | ICD-10-CM | POA: Diagnosis not present

## 2015-08-12 DIAGNOSIS — J9601 Acute respiratory failure with hypoxia: Secondary | ICD-10-CM | POA: Diagnosis present

## 2015-08-12 DIAGNOSIS — I4891 Unspecified atrial fibrillation: Secondary | ICD-10-CM | POA: Diagnosis not present

## 2015-08-12 DIAGNOSIS — L292 Pruritus vulvae: Secondary | ICD-10-CM | POA: Diagnosis present

## 2015-08-12 DIAGNOSIS — G4730 Sleep apnea, unspecified: Secondary | ICD-10-CM | POA: Diagnosis present

## 2015-08-12 DIAGNOSIS — Z79899 Other long term (current) drug therapy: Secondary | ICD-10-CM

## 2015-08-12 DIAGNOSIS — I110 Hypertensive heart disease with heart failure: Secondary | ICD-10-CM | POA: Diagnosis present

## 2015-08-12 DIAGNOSIS — Z8249 Family history of ischemic heart disease and other diseases of the circulatory system: Secondary | ICD-10-CM

## 2015-08-12 DIAGNOSIS — Z91199 Patient's noncompliance with other medical treatment and regimen due to unspecified reason: Secondary | ICD-10-CM

## 2015-08-12 DIAGNOSIS — Z888 Allergy status to other drugs, medicaments and biological substances status: Secondary | ICD-10-CM

## 2015-08-12 DIAGNOSIS — I501 Left ventricular failure: Secondary | ICD-10-CM | POA: Diagnosis present

## 2015-08-12 DIAGNOSIS — I480 Paroxysmal atrial fibrillation: Secondary | ICD-10-CM | POA: Diagnosis not present

## 2015-08-12 DIAGNOSIS — I5033 Acute on chronic diastolic (congestive) heart failure: Secondary | ICD-10-CM | POA: Diagnosis present

## 2015-08-12 DIAGNOSIS — Z91128 Patient's intentional underdosing of medication regimen for other reason: Secondary | ICD-10-CM

## 2015-08-12 DIAGNOSIS — Y929 Unspecified place or not applicable: Secondary | ICD-10-CM | POA: Diagnosis not present

## 2015-08-12 DIAGNOSIS — T50996A Underdosing of other drugs, medicaments and biological substances, initial encounter: Secondary | ICD-10-CM | POA: Diagnosis present

## 2015-08-12 DIAGNOSIS — J9801 Acute bronchospasm: Secondary | ICD-10-CM | POA: Diagnosis present

## 2015-08-12 DIAGNOSIS — R0602 Shortness of breath: Secondary | ICD-10-CM | POA: Diagnosis not present

## 2015-08-12 DIAGNOSIS — I509 Heart failure, unspecified: Secondary | ICD-10-CM

## 2015-08-12 DIAGNOSIS — I4510 Unspecified right bundle-branch block: Secondary | ICD-10-CM | POA: Diagnosis present

## 2015-08-12 DIAGNOSIS — I4892 Unspecified atrial flutter: Secondary | ICD-10-CM | POA: Diagnosis present

## 2015-08-12 DIAGNOSIS — Z6832 Body mass index (BMI) 32.0-32.9, adult: Secondary | ICD-10-CM

## 2015-08-12 LAB — I-STAT TROPONIN, ED: TROPONIN I, POC: 0.02 ng/mL (ref 0.00–0.08)

## 2015-08-12 LAB — BASIC METABOLIC PANEL
ANION GAP: 9 (ref 5–15)
BUN: 15 mg/dL (ref 6–20)
CALCIUM: 9.1 mg/dL (ref 8.9–10.3)
CO2: 33 mmol/L — ABNORMAL HIGH (ref 22–32)
Chloride: 99 mmol/L — ABNORMAL LOW (ref 101–111)
Creatinine, Ser: 1 mg/dL (ref 0.44–1.00)
GFR, EST NON AFRICAN AMERICAN: 56 mL/min — AB (ref >60)
Glucose, Bld: 98 mg/dL (ref 65–99)
POTASSIUM: 3.4 mmol/L — AB (ref 3.5–5.1)
Sodium: 141 mmol/L (ref 135–145)

## 2015-08-12 LAB — URINALYSIS, ROUTINE W REFLEX MICROSCOPIC
BILIRUBIN URINE: NEGATIVE
GLUCOSE, UA: NEGATIVE mg/dL
HGB URINE DIPSTICK: NEGATIVE
KETONES UR: NEGATIVE mg/dL
Leukocytes, UA: NEGATIVE
Nitrite: NEGATIVE
PH: 6 (ref 5.0–8.0)
PROTEIN: NEGATIVE mg/dL
Specific Gravity, Urine: 1.01 (ref 1.005–1.030)

## 2015-08-12 LAB — CBC
HEMATOCRIT: 41.4 % (ref 36.0–46.0)
Hemoglobin: 13.2 g/dL (ref 12.0–15.0)
MCH: 29.3 pg (ref 26.0–34.0)
MCHC: 31.9 g/dL (ref 30.0–36.0)
MCV: 92 fL (ref 78.0–100.0)
Platelets: 227 10*3/uL (ref 150–400)
RBC: 4.5 MIL/uL (ref 3.87–5.11)
RDW: 14.5 % (ref 11.5–15.5)
WBC: 16 10*3/uL — ABNORMAL HIGH (ref 4.0–10.5)

## 2015-08-12 LAB — HEPARIN LEVEL (UNFRACTIONATED): HEPARIN UNFRACTIONATED: 0.54 [IU]/mL (ref 0.30–0.70)

## 2015-08-12 LAB — BLOOD GAS, ARTERIAL
ACID-BASE EXCESS: 5.7 mmol/L — AB (ref 0.0–2.0)
Bicarbonate: 29.5 mEq/L — ABNORMAL HIGH (ref 20.0–24.0)
DRAWN BY: 234301
Delivery systems: POSITIVE
Expiratory PAP: 5
FIO2: 40
INSPIRATORY PAP: 10
MODE: POSITIVE
O2 SAT: 98.9 %
PO2 ART: 142 mmHg — AB (ref 80.0–100.0)
Patient temperature: 37
pCO2 arterial: 41.8 mmHg (ref 35.0–45.0)
pH, Arterial: 7.462 — ABNORMAL HIGH (ref 7.350–7.450)

## 2015-08-12 LAB — PROTIME-INR
INR: 1.2 (ref 0.00–1.49)
Prothrombin Time: 15.3 seconds — ABNORMAL HIGH (ref 11.6–15.2)

## 2015-08-12 LAB — BRAIN NATRIURETIC PEPTIDE: B NATRIURETIC PEPTIDE 5: 811 pg/mL — AB (ref 0.0–100.0)

## 2015-08-12 LAB — TSH: TSH: 5.176 u[IU]/mL — AB (ref 0.350–4.500)

## 2015-08-12 MED ORDER — LORAZEPAM 2 MG/ML IJ SOLN
0.5000 mg | Freq: Once | INTRAMUSCULAR | Status: AC
  Administered 2015-08-12: 0.5 mg via INTRAVENOUS
  Filled 2015-08-12: qty 1

## 2015-08-12 MED ORDER — AMIODARONE HCL IN DEXTROSE 360-4.14 MG/200ML-% IV SOLN
60.0000 mg/h | INTRAVENOUS | Status: AC
  Administered 2015-08-12: 60 mg/h via INTRAVENOUS
  Filled 2015-08-12: qty 200

## 2015-08-12 MED ORDER — LEVALBUTEROL HCL 1.25 MG/0.5ML IN NEBU
1.2500 mg | INHALATION_SOLUTION | Freq: Once | RESPIRATORY_TRACT | Status: AC
  Administered 2015-08-12: 1.25 mg via RESPIRATORY_TRACT

## 2015-08-12 MED ORDER — ACETAMINOPHEN 325 MG PO TABS: 650.0000 mg | ORAL_TABLET | ORAL | Status: DC | PRN

## 2015-08-12 MED ORDER — CETYLPYRIDINIUM CHLORIDE 0.05 % MT LIQD
7.0000 mL | Freq: Two times a day (BID) | OROMUCOSAL | Status: DC
  Administered 2015-08-12 – 2015-08-15 (×6): 7 mL via OROMUCOSAL

## 2015-08-12 MED ORDER — FUROSEMIDE 10 MG/ML IJ SOLN
40.0000 mg | Freq: Two times a day (BID) | INTRAMUSCULAR | Status: DC
  Administered 2015-08-12 – 2015-08-15 (×5): 40 mg via INTRAVENOUS
  Filled 2015-08-12 (×6): qty 4

## 2015-08-12 MED ORDER — ONDANSETRON HCL 4 MG/2ML IJ SOLN: 4.0000 mg | Freq: Four times a day (QID) | INTRAMUSCULAR | Status: DC | PRN

## 2015-08-12 MED ORDER — METHYLPREDNISOLONE SODIUM SUCC 125 MG IJ SOLR
125.0000 mg | Freq: Once | INTRAMUSCULAR | Status: AC
  Administered 2015-08-12: 125 mg via INTRAVENOUS
  Filled 2015-08-12: qty 2

## 2015-08-12 MED ORDER — IPRATROPIUM BROMIDE 0.02 % IN SOLN
RESPIRATORY_TRACT | Status: AC
  Administered 2015-08-12: 0.5 mg via RESPIRATORY_TRACT
  Filled 2015-08-12: qty 2.5

## 2015-08-12 MED ORDER — HEPARIN (PORCINE) IN NACL 100-0.45 UNIT/ML-% IJ SOLN
1200.0000 [IU]/h | INTRAMUSCULAR | Status: DC
  Administered 2015-08-12 – 2015-08-13 (×2): 1200 [IU]/h via INTRAVENOUS
  Filled 2015-08-12 (×2): qty 250

## 2015-08-12 MED ORDER — LEVALBUTEROL HCL 1.25 MG/0.5ML IN NEBU
INHALATION_SOLUTION | RESPIRATORY_TRACT | Status: AC
  Administered 2015-08-12: 1.25 mg via RESPIRATORY_TRACT
  Filled 2015-08-12: qty 0.5

## 2015-08-12 MED ORDER — FUROSEMIDE 10 MG/ML IJ SOLN
40.0000 mg | Freq: Once | INTRAMUSCULAR | Status: AC
  Administered 2015-08-12: 40 mg via INTRAVENOUS
  Filled 2015-08-12: qty 4

## 2015-08-12 MED ORDER — METOPROLOL TARTRATE 5 MG/5ML IV SOLN
5.0000 mg | Freq: Once | INTRAVENOUS | Status: AC
  Administered 2015-08-12: 5 mg via INTRAVENOUS
  Filled 2015-08-12: qty 5

## 2015-08-12 MED ORDER — HEPARIN BOLUS VIA INFUSION
4000.0000 [IU] | Freq: Once | INTRAVENOUS | Status: AC
  Administered 2015-08-12: 4000 [IU] via INTRAVENOUS

## 2015-08-12 MED ORDER — IPRATROPIUM BROMIDE 0.02 % IN SOLN
0.5000 mg | Freq: Once | RESPIRATORY_TRACT | Status: AC
  Administered 2015-08-12: 0.5 mg via RESPIRATORY_TRACT

## 2015-08-12 MED ORDER — CHLORHEXIDINE GLUCONATE 0.12 % MT SOLN
15.0000 mL | Freq: Two times a day (BID) | OROMUCOSAL | Status: DC
  Administered 2015-08-13 – 2015-08-15 (×5): 15 mL via OROMUCOSAL
  Filled 2015-08-12 (×4): qty 15

## 2015-08-12 MED ORDER — AMIODARONE HCL IN DEXTROSE 360-4.14 MG/200ML-% IV SOLN
30.0000 mg/h | INTRAVENOUS | Status: DC
  Administered 2015-08-12 – 2015-08-13 (×2): 30 mg/h via INTRAVENOUS
  Filled 2015-08-12 (×2): qty 200

## 2015-08-12 NOTE — ED Notes (Signed)
Pt woke up this morning with shortness of breath. Right shoulder pain. Denies chest pain . Has hx of Afib. Pt reports seen Saturday and given prednisone, seen gyn and prednisone discontinued. Gyn gave antiviral and was told she had herpes. Pt states that she has gotten worse since antiviral. Pt reports she doesn't have herpes

## 2015-08-12 NOTE — ED Provider Notes (Signed)
CSN: [id number]     Arrival date & time 08/12/15  4098 History   First MD Initiated Contact with Patient 08/12/15 0759     Chief Complaint  Patient presents with  . Shortness of Breath     (Consider location/radiation/quality/duration/timing/severity/associated sxs/prior Treatment) HPI Patient presents with acute onset shortness of breath at 4 AM. She denies any cough. She states she's having palpitations consistent with her previously diagnosed atrial fibrillation. She's had no fever or chills. No new lower extremity swelling. Patient was recently seen for vaginal rash. Given short course of prednisone. He followed up with her OB/GYN who initially started her on Valtrex thin discontinued that after testing was negative for herpes. Patient is not on any anticoagulants. Past Medical History  Diagnosis Date  . Vertigo   . Hypertension   . Episodic atrial fibrillation (HCC)     Converted to NSR recent hospitalization 01/2013 on Dilt  . Atrial flutter (HCC)   . Right bundle branch block     Appears to be chronic  . Hypercholesterolemia   . Obesity   . Fatigue   . Diastolic CHF [facility])    Past Surgical History  Procedure Laterality Date  . Wrist fracture surgery    . Carpel tunnel surgery     Family History  Problem Relation Age of Onset  . Cancer Other   . Cardiomyopathy Brother     Congenital with need for heart transplant. Died at 78.  . Asthma Mother   . Hypertension Mother   . COPD Father    Social History  Substance Use Topics  . Smoking status: Never Smoker   . Smokeless tobacco: Never Used  . Alcohol Use: 2.4 - 3.0 oz/week    4-5 Standard drinks or equivalent per week   OB History    Gravida Para Term Preterm AB TAB SAB Ectopic Multiple Living   3 3 3       2      Review of Systems  Constitutional: Negative for fever and chills.  Respiratory: Positive for shortness of breath. Negative for cough.   Cardiovascular: Positive for palpitations. Negative for chest  pain and leg swelling.  Gastrointestinal: Negative for nausea, vomiting, abdominal pain and diarrhea.  Musculoskeletal: Negative for myalgias, back pain, neck pain and neck stiffness.  Skin: Negative for rash and wound.  Neurological: Negative for dizziness, weakness, light-headedness and numbness.  Psychiatric/Behavioral: The patient is nervous/anxious.   All other systems reviewed and are negative.     Allergies  Bystolic; Metoprolol; Asa; Diltiazem; Edarbi; Lasix; and Zantac  Home Medications   Prior to Admission medications   Medication Sig Start Date End Date Taking? Authorizing Provider  predniSONE (DELTASONE) 50 MG tablet One tab for 5 days 08/01/15   Donnetta Hutching, MD  valACYclovir (VALTREX) 1000 MG tablet Take 1 tablet (1,000 mg total) by mouth 2 (two) times daily. 08/05/15   Lazaro Arms, MD   BP 137/111 mmHg  Pulse 25  Temp(Src) 98.7 F (37.1 C) (Oral)  Resp 13  Ht 5\' 8"  (1.727 m)  Wt 212 lb (96.163 kg)  BMI 32.24 kg/m2  SpO2 100% Physical Exam  Constitutional: She is oriented to person, place, and time. She appears well-developed and well-nourished. She appears distressed.  Anxious  HENT:  Head: Normocephalic and atraumatic.  Mouth/Throat: Oropharynx is clear and moist.  Eyes: EOM are normal. Pupils are equal, round, and reactive to light.  Neck: Normal range of motion. Neck supple.  Cardiovascular:  Tachycardia. Irregularly irregular.  Pulmonary/Chest: Effort normal. No respiratory distress. She has wheezes. She has no rales.  Patient with decreased air movement throughout. She does have some faint expiratory wheezing  Abdominal: Soft. Bowel sounds are normal. She exhibits no distension and no mass. There is no tenderness. There is no rebound and no guarding.  Musculoskeletal: Normal range of motion. She exhibits no edema or tenderness.  No lower extremity swelling or asymmetry.  Neurological: She is alert and oriented to person, place, and time.  5/5 motor in  all extremities. Sensation is fully intact.  Skin: Skin is warm and dry. No rash noted. No erythema.  Psychiatric: Her behavior is normal.  Nursing note and vitals reviewed.   ED Course  Procedures (including critical care time) Labs Review Labs Reviewed  BASIC METABOLIC PANEL - Abnormal; Notable for the following:    Potassium 3.4 (*)    Chloride 99 (*)    CO2 33 (*)    GFR calc non Af Amer 56 (*)    All other components within normal limits  CBC - Abnormal; Notable for the following:    WBC 16.0 (*)    All other components within normal limits  BRAIN NATRIURETIC PEPTIDE - Abnormal; Notable for the following:    B Natriuretic Peptide 811.0 (*)    All other components within normal limits  BLOOD GAS, ARTERIAL - Abnormal; Notable for the following:    pH, Arterial 7.462 (*)    pO2, Arterial 142 (*)    Bicarbonate 29.5 (*)    Acid-Base Excess 5.7 (*)    All other components within normal limits  I-STAT TROPOININ, ED    Imaging Review Dg Chest Port 1 View  08/12/2015  CLINICAL DATA:  Shortness of breath and RIGHT shoulder pain since this morning, shortness of breath worsening, on antiviral meds since 08/05/2015, hypertension, atrial fibrillation, CHF EXAM: PORTABLE CHEST 1 VIEW COMPARISON:  Portable exam 0813 hours compared to 11/17/2014 FINDINGS: Enlargement of cardiac silhouette with pulmonary vascular congestion. Atherosclerotic calcification aorta. Mild perihilar infiltrates likely representing pulmonary edema. No segmental consolidation, pleural effusion or pneumothorax. Bones unremarkable. IMPRESSION: Enlargement of cardiac silhouette with pulmonary vascular congestion and mild pulmonary edema/CHF. Aortic atherosclerosis. Electronically Signed   By: Ulyses Southward M.D.   On: 08/12/2015 08:28   I have personally reviewed and evaluated these images and lab results as part of my medical decision-making.   EKG Interpretation   Date/Time:  Wednesday August 12 2015 07:52:06  EDT Ventricular Rate:  149 PR Interval:    QRS Duration: 117 QT Interval:  286 QTC Calculation: 451 R Axis:   -23 Text Interpretation:  Atrial fibrillation with rapid V-rate Ventricular  premature complex Right bundle branch block Nonspecific T abnormalities,  lateral leads Confirmed by Ranae Palms  MD, Allon Costlow ([id number]) on 08/12/2015  8:28:17 AM     CRITICAL CARE Performed by: Ranae Palms, Kemba Hoppes Total critical care time: 30 minutes Critical care time was exclusive of separately billable procedures and treating other patients. Critical care was necessary to treat or prevent imminent or life-threatening deterioration. Critical care was time spent personally by me on the following activities: development of treatment plan with patient and/or surrogate as well as nursing, discussions with consultants, evaluation of patient's response to treatment, examination of patient, obtaining history from patient or surrogate, ordering and performing treatments and interventions, ordering and review of laboratory studies, ordering and review of radiographic studies, pulse oximetry and re-evaluation of patient's condition. MDM   Final diagnoses:  Atrial fibrillation with RVR (HCC)  Acute on chronic congestive heart failure, unspecified congestive heart failure type (HCC)  Bronchospasm    Patient with evidence of bronchospasm on exam. She also has an atrial fibrillation with RVR. Upon review of old notes patient has been resistant to diltiazem. It is in her allergy list. Her previous A. fib relation with RVR was controlled with amiodarone. We'll start her on this. Given the work of breathing will start on BiPAP. Also will give a Xopenex neb. Realizes may work to increase her heart rate. We'll also give IV Solu-Medrol.  Patient is much more comfortable on BiPAP. Appears to be moving air much betterAfter Xopenex. She is more relaxed. X-ray with evidence of pulmonary edema. Elevation in BNP. We'll also give dose of  Lasix and discuss admission with [facility] hospital[facility].  Loren Racer, MD 08/12/15 [phone]

## 2015-08-12 NOTE — Progress Notes (Signed)
Patient is on [facility] with no respiratory distress. Bipap still on standby at bedside if needed. RT will continue to monitor.

## 2015-08-12 NOTE — H&P (Signed)
History and Physical    [name] WUJ:[id number] DOB: 1946-01-01 DOA: 08/12/2015  PCP: Wilson Singer, MD  Patient coming from: home  Chief Complaint: shortness of breath  HPI: Lindsay Whitehead is a 70 y.o. female with medical history significant of PAF, HLD, RBBB, HTN, CHF pEF, medical non compliance, presents to the ED with chief complaint of palpitations and shortness of breath of sudden onset this morning around 4 am. She denies chest pain. She has no abdominal pain, nausea or vomiting. She has a long standing history of self - discontinuing her medications including Amiodarone, Eliquis, due to multiple side effects / intolerances, well documented in outpatient cardiology visits. Currently she is taking no cardiac medications. She complains of streaks of blood in her stool while on Eliquis, now resolved off of the blood thinner. She is intolerant to BB and CCB, although inpatient she has tolerated in the past. Denies current fever and chills. Complains of vaginal itching for which she is being evaluated in GYN clinic as an outpatient, thought to have herpes infection however IgG testing returned negative and is supposed to have vulvar biopsy soon. She thinks her A fib is due to this as it is being very "stressful". Started on a thyroid medication (not in her medication list, ?) by PCP. Unknown recent TSH, most recent one in 2016 normal. In the ER she was found to be in A fib with RVR with rates 160-180s, started on Amiodarone gtt due to above mentioned intolerances. CXR shows pulmonary edema and she was hypoxic requiring BiPAP. [facility] asked for SDU admission   Review of Systems: As per HPI otherwise 10 point review of systems negative.   Past Medical History  Diagnosis Date  . Vertigo   . Hypertension   . Episodic atrial fibrillation (HCC)     Converted to NSR recent hospitalization 01/2013 on Dilt  . Atrial flutter (HCC)   . Right bundle branch block     Appears to be chronic  .  Hypercholesterolemia   . Obesity   . Fatigue   . Diastolic CHF [facility])     Past Surgical History  Procedure Laterality Date  . Wrist fracture surgery    . Carpel tunnel surgery       reports that she has never smoked. She has never used smokeless tobacco. She reports that she drinks about 2.4 - 3.0 oz of alcohol per week. She reports that she does not use illicit drugs.  Allergies  Allergen Reactions  . Bystolic [Nebivolol Hcl] Other (See Comments)    Extreme chest pains/ lowering of blood pressure causing dizziness, falling  . Metoprolol Other (See Comments)    Extreme drop in blood pressure, vertigo resulted  . Asa [Aspirin]     Bleeding gums  . Diltiazem     bleeding gums, stomach ache,weekness   . Edarbi [Azilsartan] Hives  . Lasix [Furosemide] Other (See Comments)    Cramping in thigh area   . Zantac [Ranitidine Hcl]     Family History  Problem Relation Age of Onset  . Cancer Other   . Cardiomyopathy Brother     Congenital with need for heart transplant. Died at 42.  . Asthma Mother   . Hypertension Mother   . COPD Father     Prior to Admission medications   Medication Sig Start Date End Date Taking? Authorizing Provider  predniSONE (DELTASONE) 50 MG tablet One tab for 5 days 08/01/15   Donnetta Hutching, MD  valACYclovir (VALTREX) 1000 MG tablet  Take 1 tablet (1,000 mg total) by mouth 2 (two) times daily. 08/05/15   Lazaro Arms, MD    Physical Exam: Filed Vitals:   08/12/15 0830 08/12/15 0832 08/12/15 0900 08/12/15 0930  BP:  137/111 137/99 157/119  Pulse: 25     Temp:      TempSrc:      Resp: 21 13 24 22   Height:      Weight:      SpO2: 100%   100%      Constitutional: NAD, calm, BiPAP on  Filed Vitals:   08/12/15 0830 08/12/15 0832 08/12/15 0900 08/12/15 0930  BP:  137/111 137/99 157/119  Pulse: 25     Temp:      TempSrc:      Resp: 21 13 24 22   Height:      Weight:      SpO2: 100%   100%   Eyes: PERRL Neck: normal, supple, no  masses, Respiratory: bibasilar crackles, no wheezing. Labored breathing.  Cardiovascular: Irregular, tachycardic. No murmurs appreciated. No extremity edema. 2+ pedal pulses. Abdomen: no tenderness. Bowel sounds positive.  Musculoskeletal: no clubbing / cyanosis.  Skin: no rashes, lesions, ulcers. No induration Neurologic: CN 2-12 grossly intact. Strength 5/5 in all 4.  Psychiatric: Normal judgment and insight. Alert and oriented x 3. Anxious  Labs on Admission: I have personally reviewed following labs and imaging studies  CBC:  Recent Labs Lab 08/12/15 0806  WBC 16.0*  HGB 13.2  HCT 41.4  MCV 92.0  PLT 227   Basic Metabolic Panel:  Recent Labs Lab 08/12/15 0806  NA 141  K 3.4*  CL 99*  CO2 33*  GLUCOSE 98  BUN 15  CREATININE 1.00  CALCIUM 9.1   GFR: Estimated Creatinine Clearance: 63.5 mL/min (by C-G formula based on Cr of 1). Liver Function Tests: No results for input(s): AST, ALT, ALKPHOS, BILITOT, PROT, ALBUMIN in the last 168 hours. No results for input(s): LIPASE, AMYLASE in the last 168 hours. No results for input(s): AMMONIA in the last 168 hours. Coagulation Profile: No results for input(s): INR, PROTIME in the last 168 hours. Cardiac Enzymes: No results for input(s): CKTOTAL, CKMB, CKMBINDEX, TROPONINI in the last 168 hours. BNP (last 3 results) No results for input(s): PROBNP in the last 8760 hours. HbA1C: No results for input(s): HGBA1C in the last 72 hours. CBG: No results for input(s): GLUCAP in the last 168 hours. Lipid Profile: No results for input(s): CHOL, HDL, LDLCALC, TRIG, CHOLHDL, LDLDIRECT in the last 72 hours. Thyroid Function Tests: No results for input(s): TSH, T4TOTAL, FREET4, T3FREE, THYROIDAB in the last 72 hours. Anemia Panel: No results for input(s): VITAMINB12, FOLATE, FERRITIN, TIBC, IRON, RETICCTPCT in the last 72 hours. Urine analysis:    Component Value Date/Time   COLORURINE YELLOW 07/18/2014 1158   APPEARANCEUR CLEAR  07/18/2014 1158   LABSPEC >1.030* 07/18/2014 1158   PHURINE 5.5 07/18/2014 1158   GLUCOSEU NEGATIVE 07/18/2014 1158   HGBUR NEGATIVE 07/18/2014 1158   BILIRUBINUR NEGATIVE 07/18/2014 1158   KETONESUR NEGATIVE 07/18/2014 1158   PROTEINUR TRACE* 07/18/2014 1158   UROBILINOGEN 0.2 07/18/2014 1158   NITRITE NEGATIVE 07/18/2014 1158   LEUKOCYTESUR NEGATIVE 07/18/2014 1158    Radiological Exams on Admission: Dg Chest Port 1 View  08/12/2015  CLINICAL DATA:  Shortness of breath and RIGHT shoulder pain since this morning, shortness of breath worsening, on antiviral meds since 08/05/2015, hypertension, atrial fibrillation, CHF EXAM: PORTABLE CHEST 1 VIEW COMPARISON:  Portable exam 0813  hours compared to 11/17/2014 FINDINGS: Enlargement of cardiac silhouette with pulmonary vascular congestion. Atherosclerotic calcification aorta. Mild perihilar infiltrates likely representing pulmonary edema. No segmental consolidation, pleural effusion or pneumothorax. Bones unremarkable. IMPRESSION: Enlargement of cardiac silhouette with pulmonary vascular congestion and mild pulmonary edema/CHF. Aortic atherosclerosis. Electronically Signed   By: Ulyses Southward M.D.   On: 08/12/2015 08:28    EKG: Independently reviewed. A fib with RVR  Assessment/Plan Active Problems:   Atrial fibrillation with RVR (HCC)   Non compliance with medical treatment   HTN (hypertension)   Acute respiratory failure with hypoxia (HCC)   Acute on chronic diastolic (congestive) heart failure (HCC)   Pulmonary edema cardiac cause (HCC)   A fib with RVR - due to non compliance with home regimen - continue IV amiodarone star - patient's CHA2DS2-VASc Score for Stroke Risk is 3 - heparin per pharmacy. Patient very concerned about the blood thinner. Closely monitor for bleeding - consulted cardiology, appreciate input  Acute hypoxic respiratory failure due to pulmonary edema in the setting of A fib with RVR and acute on chronic diastolic  CHF - received Lasix x 1 in the ED, continue Lasix BID for now - BiPAP, admit to SDU - wean off oxygen as able - BNP elevated to 811 on admission, CXR with vascular congestion  HTN - on no medications at home - monitor for now  ?hypothyroidism  - recheck TSH. Unable to confirm what type of thyroid supplementation patient is taking  Vaginal itching - outpatient workup underway with biopsy planned. Patient very concerned about cancer. - check UA  Leukocytosis - likely due to prednisone as an outpatient  - check UA   DVT prophylaxis: heparin gtt  Code Status: Full  Family Communication: d/w husband bedside Disposition Plan: admit to SDU Consults called: cardiology  Admission status: inpatient   Pamella Pert, MD [facility] Pager [phone]  If 7PM-7AM, please contact night-coverage [email or web address] Password [facility]  08/12/2015, 9:51 AM

## 2015-08-12 NOTE — Consult Note (Addendum)
Cardiology Consultation   Patient ID: Lindsay Whitehead; [id number]; October 26, 1945   Admit date: 08/12/2015 Date of Consult: 08/12/2015  Referring MD:  Dr. Karilyn Cota Cardiologist:Dr. Wyline Mood Consulting Cardiologist: Dr. Purvis Sheffield  Patient Care Team: Wilson Singer, MD as PCP - General (Internal Medicine) Marinus Maw, MD as Consulting Physician (Cardiology)    Reason for Consultation: rapid afib   History of Present Illness: Lindsay Whitehead is a 70 y.o. female with a hx of . She is an established patient of Dr. Wyline Mood. She was seen in the office 07/30/15 by Dr. Swaziland for a second opinion. She has a history of paroxysmal atrial fibrillation and flutter associated with diastolic CHF and pulmonary edema. She tells me that her troubles began about 5 years ago when she gained weight- over 35 lbs. Prior to this she states she was very active. Since then she is more SOB. In December 2014 she was admitted with atrial flutter with RVR. She converted on IV cardizem. She continued to have palpitations. An event monitor was ordered but never worn. She was seen by Dr. Lewayne Bunting In April 2015 for consideration of atrial flutter ablation but the patient refused. She was readmitted in May 2016 with recurrent Atrial flutter and again converted on cardizem. In September 2016 she was admitted with severe HTN, recurrent atrial flutter with RVR and pulmonary edema. She converted to NSR on IV amiodarone. She was DC on amiodarone and Eliquis. She has stopped taking both of these medications. She has been referred for sleep studies on more than one occasion but has never followed through. She has had numerous Echo studies done that demonstrate LVH with normal systolic function, LAE ranging from moderate to severe. No valvular abnormalities. Stress myoview study in December 2014 was low risk without ischemia and normal EF.  On follow up today she complains of intermittent palpitations consistent with her atrial  flutter. This is more frequent if she is very active or straining. It is worse when she drinks caffeine. It may last all day. It is sporadic. She was place on a thyroid pill (although TSH normal) and this caused worsening palpitations. She complains of fatigue. She complains of dyspnea on exertion. She has no chest pain and no edema.   She reports multiple drug intolerances. Per her records she previously stopped taking Eliquis due to cost. It was recommended she switch to Coumadin but she never went to the coumadin clinic. She tells me she stopped Eliquis because she saw blood in her stool and didn't want to bleed to death. She never had this evaluated. She reports amiodarone caused her toes to turn blue. She states beta blockers resulted in "extreme" drops in BP. Diltiazem caused weakness, stomach ache and bleeding gums. Edarbi caused hives. With this complaints she would just stop taking the medication. States she does not want to have an ablation because she might die or bleed to death on the table. She also doesn't want to take any blood thinner because of her concerns about bleeding to death but isn't really concerned about her risk of stroke. She tells me she knows she has sleep apnea but this has never been formally evaluated.   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 She took benadryl at 6 pm, 10 pm, 2 am, and went into rapid afib at 4 am. She flatly refuses to take any cardiac meds because of side effects. Currently on non-rebreather.       Past Medical  History  Diagnosis Date  . Vertigo   . Hypertension   . Episodic atrial fibrillation (HCC)     Converted to NSR recent hospitalization 01/2013 on Dilt  . Atrial flutter (HCC)   . Right bundle branch block     Appears to be chronic  . Hypercholesterolemia   . Obesity   . Fatigue   . Diastolic CHF [facility])     Past Surgical History  Procedure Laterality Date  . Wrist fracture surgery    . Carpel  tunnel surgery        Home Meds: Prior to Admission medications   Medication Sig Start Date End Date Taking? Authorizing Provider  clotrimazole-betamethasone (LOTRISONE) cream Apply 1 application topically 2 (two) times daily as needed. 08/03/15  Yes Historical Provider, MD  furosemide (LASIX) 40 MG tablet Take 1 tablet by mouth daily. 08/03/15  Yes Historical Provider, MD  valACYclovir (VALTREX) 1000 MG tablet Take 1 tablet (1,000 mg total) by mouth 2 (two) times daily. 08/05/15  Yes Lazaro Arms, MD  predniSONE (DELTASONE) 50 MG tablet One tab for 5 days 08/01/15   Donnetta Hutching, MD    Current Medications: . heparin  4,000 Units Intravenous Once     Allergies:    Allergies  Allergen Reactions  . Bystolic [Nebivolol Hcl] Other (See Comments)    Extreme chest pains/ lowering of blood pressure causing dizziness, falling  . Metoprolol Other (See Comments)    Extreme drop in blood pressure, vertigo resulted  . Asa [Aspirin]     Bleeding gums  . Diltiazem     bleeding gums, stomach ache,weekness   . Edarbi [Azilsartan] Hives  . Lasix [Furosemide] Other (See Comments)    Cramping in thigh area   . Zantac [Ranitidine Hcl]     Social History:   The patient  reports that she has never smoked. She has never used smokeless tobacco. She reports that she drinks about 2.4 - 3.0 oz of alcohol per week. She reports that she does not use illicit drugs.    Family History:   The patient's family history includes Asthma in her mother; COPD in her father; Cancer in her other; Cardiomyopathy in her brother; Hypertension in her mother.   ROS:  Please see the history of present illness.  Review of Systems  Constitution: Positive for weakness.  Cardiovascular: Positive for dyspnea on exertion and irregular heartbeat.  Hematologic/Lymphatic: Bruises/bleeds easily.  Genitourinary: Positive for genital sores.   All other ROS reviewed and negative.      Vital Signs: Blood pressure 119/106, pulse 141,  temperature 98.7 F (37.1 C), temperature source Oral, resp. rate 23, height  (1.727 m), weight 212 lb (96.163 kg), SpO2 100 %.   PHYSICAL EXAM: General:  Well nourished, well developed, in no acute distress  HEENT: normal Lymph: no adenopathy Neck: no JVD Endocrine:  No thryomegaly Vascular: No carotid bruits; FA pulses 2+ bilaterally without bruits Cardiac: irreg irreg; normal S1, S2; no murmur, rub, bruit, thrill, or heave Lungs: Decreased breath sounds clear to auscultation bilaterally, no wheezing, rhonchi or rales Abd: soft, nontender, no hepatomegaly Ext: no edema, Good distal pulses bilaterally Musculoskeletal:  No deformities, BUE and BLE strength normal and equal Skin: warm and dry Neuro:  CNs 2-12 intact, no focal abnormalities noted Psych:  Normal affect    EKG:  Atrial fibrillation with rapid rate and nonspecific ST changes.  Telemetry: afib at 150/m  Labs: No results for input(s): CKTOTAL, CKMB, TROPONINI in the last  72 hours. Lab Results  Component Value Date   WBC 16.0* 08/12/2015   HGB 13.2 08/12/2015   HCT 41.4 08/12/2015   MCV 92.0 08/12/2015   PLT 227 08/12/2015    Recent Labs Lab 08/12/15 0806  NA 141  K 3.4*  CL 99*  CO2 33*  BUN 15  CREATININE 1.00  CALCIUM 9.1  GLUCOSE 98   No results found for: CHOL, HDL, LDLCALC, TRIG Lab Results  Component Value Date   DDIMER 2.40* 11/17/2014    Radiology/Studies:  Dg Chest Port 1 View  08/12/2015  CLINICAL DATA:  Shortness of breath and RIGHT shoulder pain since this morning, shortness of breath worsening, on antiviral meds since 08/05/2015, hypertension, atrial fibrillation, CHF EXAM: PORTABLE CHEST 1 VIEW COMPARISON:  Portable exam 0813 hours compared to 11/17/2014 FINDINGS: Enlargement of cardiac silhouette with pulmonary vascular congestion. Atherosclerotic calcification aorta. Mild perihilar infiltrates likely representing pulmonary edema. No segmental consolidation, pleural effusion or  pneumothorax. Bones unremarkable. IMPRESSION: Enlargement of cardiac silhouette with pulmonary vascular congestion and mild pulmonary edema/CHF. Aortic atherosclerosis. Electronically Signed   By: Ulyses Southward M.D.   On: 08/12/2015 08:28   IMPRESSION: Low risk Lexiscan Cardiolite as outlined. There were no diagnostic ST segment abnormalities over nonspecific changes at baseline. Sinus rhythm was noted throughout the study. Perfusion imaging is most consistent with soft tissue attenuation affecting the anterior wall, no definite scar or ischemia. LVEF 52% with increased LV volumes.     Electronically Signed   By: Nona Dell M.D.   On: 02/18/2013 15:11  2-D echo 11/18/14 Study Conclusions   - Left ventricle: The cavity size was normal. Wall thickness was   increased in a pattern of mild LVH. Systolic function was normal.   The estimated ejection fraction was in the range of 60% to 65%.   Diastolic function is abnormal, indeterminate grade. Wall motion   was normal; there were no regional wall motion abnormalities. - Aortic valve: Valve area (VTI): 2.04 cm^2. Valve area (Vmax):   2.01 cm^2. - Left atrium: The atrium was severely dilated. - Atrial septum: No defect or patent foramen ovale was identified. - Technically adequate study.   PROBLEM LIST:  Active Problems:   Atrial fibrillation with RVR (HCC)   Non compliance with medical treatment   HTN (hypertension)   Acute respiratory failure with hypoxia (HCC)   Acute on chronic diastolic (congestive) heart failure (HCC)     ASSESSMENT AND PLAN:  1. Rapid Paroxysmal Atrial flutter. Currently on IV Amiodarone but flatly refuses to take any as outpatient.Patient continues to have intermittent palpitations.[place] Vasc score of 4. Annual risk of CVA 4%. HAS bled score of 1. I would recommend anticoagulation to reduce risk of stroke. I explained if she has blood in her stool she needs to have this evaluated with  endoscopy/colonoscopy. She refuses to take anticoagulation. She has consistently been noncompliant with recommended medical therapy including rate control or antiarrhythmic therapy for reasons that are not consistent or clear. Usually just stops medication on her own. Her atrial flutter is exacerbated by poor BP control. Sleep apnea may be a contributing factor. Dr. Swaziland said "after extensive discussion of these issues I do not feel I can add anything to her therapy that has not been previously recommended.  " 2. HTN- poorly controlled. 3. Acute on chronic Diastolic CHF . Exacerbated when she has sustained atrial flutter with pulmonary edema. Volume status ok fair today on lasix but I am concerned that long term  this will remain and issue. 4. Obesity with possible sleep apnea. Recommend she follow thru with formal sleep study and be treated if she has significant OSA as this may help reduce her Atrial flutter. Recommend regular aerobic exercise and weight loss.   5. RBBB chronic   The patient has poor insight into her medical condition and is not compliant with medical recommendations or follow through. Unless she is more proactive with lifestyle modification and willing to work closely to manage these conditions I feel there is little I can offer. I recommend she continue to follow with Dr. Karilyn Cota and our office in [place].     Signed, Jacolyn Reedy, PA-C  08/12/2015 10:44 AM   The patient was seen and examined, and I agree with the history, physical exam, assessment and plan as documented above which has been discussed with Lindsay Gauze PA-C. As detailed above and by Dr. Swaziland, there is nothing we can offer this patient as she is consistently non-compliant with treatment strategies. Would recommend IV amdiodarone for HR control. If it remains elevated tomorrow, could switch to IV diltiazem. Continue IV Lasix BID for now. She refuses to take as outpatient. She refuses anticoagulation.  Prentice Docker, MD, [facility]  08/12/2015 11:53 AM

## 2015-08-12 NOTE — Progress Notes (Signed)
**Note De-Identified Lindsay Whitehead Obfuscation** Removed from BIPAP and placed on 2L ; patient tolerated well.  RRT to continue to monitor.

## 2015-08-12 NOTE — Progress Notes (Signed)
ANTICOAGULATION CONSULT NOTE - Initial Consult  Pharmacy Consult for Heparin Indication: atrial fibrillation  Allergies  Allergen Reactions  . Bystolic [Nebivolol Hcl] Other (See Comments)    Extreme chest pains/ lowering of blood pressure causing dizziness, falling  . Metoprolol Other (See Comments)    Extreme drop in blood pressure, vertigo resulted  . Asa [Aspirin]     Bleeding gums  . Diltiazem     bleeding gums, stomach ache,weekness   . Edarbi [Azilsartan] Hives  . Lasix [Furosemide] Other (See Comments)    Cramping in thigh area   . Zantac [Ranitidine Hcl]     Patient Measurements: Height: 5\' 8"  (172.7 cm) Weight: 212 lb (96.163 kg) IBW/kg (Calculated) : 63.9 HEPARIN DW (KG): 84.8  Vital Signs: Temp: 98.7 F (37.1 C) (06/21 0749) Temp Source: Oral (06/21 0749) BP: 119/106 mmHg (06/21 1028) Pulse Rate: 141 (06/21 1028)  Labs:  Recent Labs  08/12/15 0806  HGB 13.2  HCT 41.4  PLT 227  CREATININE 1.00    Estimated Creatinine Clearance: 63.5 mL/min (by C-G formula based on Cr of 1).   Medical History: Past Medical History  Diagnosis Date  . Vertigo   . Hypertension   . Episodic atrial fibrillation (HCC)     Converted to NSR recent hospitalization 01/2013 on Dilt  . Atrial flutter (HCC)   . Right bundle branch block     Appears to be chronic  . Hypercholesterolemia   . Obesity   . Fatigue   . Diastolic CHF (HCC)     Medications:  See med rec  Assessment: 70 yo present with acute onset SOB this Am. She has been having palpitations and previously diagnosed with atrial fibrillation. Heparin for atrial fibrillation.   Goal of Therapy:  Heparin level 0.3-0.7 units/ml Monitor platelets by anticoagulation protocol: Yes   Plan:  Give 4000 units bolus x 1 Start heparin infusion at 1200 units/hr Check anti-Xa level in 8   hours and daily while on heparin Continue to monitor H&H and platelets  Elder Cyphers, BS Loura Back, BCPS Clinical  Pharmacist Pager [phone] 08/12/2015,10:33 AM

## 2015-08-12 NOTE — Progress Notes (Signed)
ANTICOAGULATION CONSULT NOTE - Follow up  Pharmacy Consult for Heparin Indication: atrial fibrillation  Allergies  Allergen Reactions  . Bystolic [Nebivolol Hcl] Other (See Comments)    Extreme chest pains/ lowering of blood pressure causing dizziness, falling  . Metoprolol Other (See Comments)    Extreme drop in blood pressure, vertigo resulted  . Asa [Aspirin]     Bleeding gums  . Diltiazem     bleeding gums, stomach ache,weekness   . Edarbi [Azilsartan] Hives  . Lasix [Furosemide] Other (See Comments)    Cramping in thigh area   . Zantac [Ranitidine Hcl]     Patient Measurements: Height: 5\' 8"  (172.7 cm) Weight: 212 lb (96.163 kg) IBW/kg (Calculated) : 63.9 HEPARIN DW (KG): 84.8  Vital Signs: Temp: 97.9 F (36.6 C) (06/21 1647) Temp Source: Oral (06/21 1647) BP: 101/91 mmHg (06/21 1800) Pulse Rate: 133 (06/21 1800)  Labs:  Recent Labs  08/12/15 0806 08/12/15 1853  HGB 13.2  --   HCT 41.4  --   PLT 227  --   LABPROT 15.3*  --   INR 1.20  --   HEPARINUNFRC  --  0.54  CREATININE 1.00  --     Estimated Creatinine Clearance: 63.5 mL/min (by C-G formula based on Cr of 1).   Medical History: Past Medical History  Diagnosis Date  . Vertigo   . Hypertension   . Episodic atrial fibrillation (HCC)     Converted to NSR recent hospitalization 01/2013 on Dilt  . Atrial flutter (HCC)   . Right bundle branch block     Appears to be chronic  . Hypercholesterolemia   . Obesity   . Fatigue   . Diastolic CHF (HCC)     Medications:  See med rec  Assessment: 70 yo present with acute onset SOB this Am. She has been having palpitations and previously diagnosed with atrial fibrillation. Heparin for atrial fibrillation.  Heparin level at goal  Goal of Therapy:  Heparin level 0.3-0.7 units/ml Monitor platelets by anticoagulation protocol: Yes   Plan:  Continue heparin at 1200 units/hour Heparin level at 5 AM and daily while on heparin Monitor CBC,  platelets Monitor for bleeding  Elder Cyphers, BS Loura Back, BCPS Clinical Pharmacist Pager [phone] 08/12/2015,8:07 PM

## 2015-08-12 NOTE — Progress Notes (Signed)
RT transported patin on BIPAP to ICU, no complications noted

## 2015-08-13 DIAGNOSIS — J9601 Acute respiratory failure with hypoxia: Secondary | ICD-10-CM

## 2015-08-13 DIAGNOSIS — I501 Left ventricular failure: Secondary | ICD-10-CM

## 2015-08-13 LAB — CBC
HCT: 37.2 % (ref 36.0–46.0)
Hemoglobin: 11.9 g/dL — ABNORMAL LOW (ref 12.0–15.0)
MCH: 29.2 pg (ref 26.0–34.0)
MCHC: 32 g/dL (ref 30.0–36.0)
MCV: 91.4 fL (ref 78.0–100.0)
PLATELETS: 205 10*3/uL (ref 150–400)
RBC: 4.07 MIL/uL (ref 3.87–5.11)
RDW: 14.5 % (ref 11.5–15.5)
WBC: 11.1 10*3/uL — ABNORMAL HIGH (ref 4.0–10.5)

## 2015-08-13 LAB — HEPARIN LEVEL (UNFRACTIONATED)
Heparin Unfractionated: <0.1 IU/mL — ABNORMAL LOW (ref 0.30–0.70)
Heparin Unfractionated: 0.34 IU/mL (ref 0.30–0.70)

## 2015-08-13 MED ORDER — DILTIAZEM LOAD VIA INFUSION
10.0000 mg | Freq: Once | INTRAVENOUS | Status: AC
  Administered 2015-08-13: 10 mg via INTRAVENOUS
  Filled 2015-08-13: qty 10

## 2015-08-13 MED ORDER — APIXABAN 5 MG PO TABS
5.0000 mg | ORAL_TABLET | Freq: Two times a day (BID) | ORAL | Status: DC
  Administered 2015-08-13 – 2015-08-15 (×5): 5 mg via ORAL
  Filled 2015-08-13 (×5): qty 1

## 2015-08-13 MED ORDER — LEVALBUTEROL HCL 0.63 MG/3ML IN NEBU
INHALATION_SOLUTION | RESPIRATORY_TRACT | Status: AC
Start: 2015-08-13 — End: 2015-08-13
  Filled 2015-08-13: qty 3

## 2015-08-13 MED ORDER — DILTIAZEM HCL 100 MG IV SOLR
5.0000 mg/h | INTRAVENOUS | Status: DC
  Administered 2015-08-13: 5 mg/h via INTRAVENOUS
  Administered 2015-08-13: 15 mg/h via INTRAVENOUS
  Filled 2015-08-13 (×3): qty 100

## 2015-08-13 MED ORDER — LEVALBUTEROL HCL 0.63 MG/3ML IN NEBU
0.6300 mg | INHALATION_SOLUTION | Freq: Four times a day (QID) | RESPIRATORY_TRACT | Status: DC | PRN
  Administered 2015-08-13: 0.63 mg via RESPIRATORY_TRACT

## 2015-08-13 NOTE — Progress Notes (Signed)
Pt heart rate fluctuates between 98-150 +/- see flowsheet. More consistently in the 100+ range. No obvious or stated signs of distress.

## 2015-08-13 NOTE — Progress Notes (Signed)
SUBJECTIVE: Says she has some mucous build up in her throat, also feels fatigued and short of breath.   ROS: Other than pertinent positives in "Subjective", all others were reviewed and found to be negative.   Intake/Output Summary (Last 24 hours) at 08/13/15 0805 Last data filed at 08/13/15 0500  Gross per 24 hour  Intake 1023.07 ml  Output   3100 ml  Net -2076.93 ml    Current Facility-Administered Medications  Medication Dose Route Frequency Provider Last Rate Last Dose  . acetaminophen (TYLENOL) tablet 650 mg  650 mg Oral Q4H PRN Costin Otelia Sergeant, MD      . amiodarone (NEXTERONE PREMIX) 360-4.14 MG/200ML-% (1.8 mg/mL) IV infusion  30 mg/hr Intravenous Continuous Loren Racer, MD 16.7 mL/hr at 08/13/15 0500 30 mg/hr at 08/13/15 0500  . antiseptic oral rinse (CPC / CETYLPYRIDINIUM CHLORIDE 0.05%) solution 7 mL  7 mL Mouth Rinse q12n4p Costin Otelia Sergeant, MD   7 mL at 08/12/15 1600  . chlorhexidine (PERIDEX) 0.12 % solution 15 mL  15 mL Mouth Rinse BID Leatha Gilding, MD   15 mL at 08/12/15 2200  . furosemide (LASIX) injection 40 mg  40 mg Intravenous Q12H Leatha Gilding, MD   40 mg at 08/12/15 2047  . heparin ADULT infusion 100 units/mL (25000 units/22mL sodium chloride 0.45%)  1,200 Units/hr Intravenous Continuous Costin Otelia Sergeant, MD 12 mL/hr at 08/13/15 0500 1,200 Units/hr at 08/13/15 0500  . ondansetron (ZOFRAN) injection 4 mg  4 mg Intravenous Q6H PRN Leatha Gilding, MD        Filed Vitals:   08/13/15 0500 08/13/15 0530 08/13/15 0700 08/13/15 0748  BP:  136/84 121/102   Pulse: 77 118 118   Temp:    98.1 F (36.7 C)  TempSrc:    Oral  Resp: 18 20 18    Height:      Weight: 210 lb 15.7 oz (95.7 kg)     SpO2: 100% 100% 100%     PHYSICAL EXAM General: NAD HEENT: Normal. Neck: No JVD, no thyromegaly.  Lungs: Diminished throughout, no rales CV: Nondisplaced PMI.  Tachycardic, irregular rhythm, normal S1/S2, no S3, no murmur.  No pretibial edema.    Abdomen: Soft, nontender, obese.  Neurologic: Alert and oriented x 3.  Psych: Normal affect. Musculoskeletal: No gross deformities. Extremities: No clubbing or cyanosis.   TELEMETRY: Reviewed telemetry pt in rapid atrial fibrillation.  LABS: Basic Metabolic Panel:  Recent Labs  05/39/76 0806  NA 141  K 3.4*  CL 99*  CO2 33*  GLUCOSE 98  BUN 15  CREATININE 1.00  CALCIUM 9.1   Liver Function Tests: No results for input(s): AST, ALT, ALKPHOS, BILITOT, PROT, ALBUMIN in the last 72 hours. No results for input(s): LIPASE, AMYLASE in the last 72 hours. CBC:  Recent Labs  08/12/15 0806 08/13/15 0445  WBC 16.0* 11.1*  HGB 13.2 11.9*  HCT 41.4 37.2  MCV 92.0 91.4  PLT 227 205   Cardiac Enzymes: No results for input(s): CKTOTAL, CKMB, CKMBINDEX, TROPONINI in the last 72 hours. BNP: Invalid input(s): POCBNP D-Dimer: No results for input(s): DDIMER in the last 72 hours. Hemoglobin A1C: No results for input(s): HGBA1C in the last 72 hours. Fasting Lipid Panel: No results for input(s): CHOL, HDL, LDLCALC, TRIG, CHOLHDL, LDLDIRECT in the last 72 hours. Thyroid Function Tests:  Recent Labs  08/12/15 0806  TSH 5.176*   Anemia Panel: No results for input(s): VITAMINB12, FOLATE, FERRITIN, TIBC, IRON,  RETICCTPCT in the last 72 hours.  RADIOLOGY: Dg Chest Port 1 View  08/12/2015  CLINICAL DATA:  Shortness of breath and RIGHT shoulder pain since this morning, shortness of breath worsening, on antiviral meds since 08/05/2015, hypertension, atrial fibrillation, CHF EXAM: PORTABLE CHEST 1 VIEW COMPARISON:  Portable exam 0813 hours compared to 11/17/2014 FINDINGS: Enlargement of cardiac silhouette with pulmonary vascular congestion. Atherosclerotic calcification aorta. Mild perihilar infiltrates likely representing pulmonary edema. No segmental consolidation, pleural effusion or pneumothorax. Bones unremarkable. IMPRESSION: Enlargement of cardiac silhouette with pulmonary vascular  congestion and mild pulmonary edema/CHF. Aortic atherosclerosis. Electronically Signed   By: Ulyses Southward M.D.   On: 08/12/2015 08:28      ASSESSMENT AND PLAN: 1. Rapid Paroxysmal Atrial flutter/fibrillation. Currently on IV Amiodarone but flatly refuses to take any as outpatient. Still in rapid atrial fib. Will switch to IV diltiazem.[place] Vasc score of 4. Annual risk of CVA 4%. HAS bled score of 1. I would recommend anticoagulation to reduce risk of stroke. I explained if she has blood in her stool she needs to have this evaluated with endoscopy/colonoscopy. She refuses to take anticoagulation. She has consistently been noncompliant with recommended medical therapy including rate control or antiarrhythmic therapy for reasons that are not consistent or clear. Usually just stops medication on her own. Her atrial flutter is exacerbated by poor BP control. Sleep apnea may be a contributing factor. Dr. Sw[place] said "after extensive discussion of these issues I do not feel I can add anything to her therapy that has not been previously recommended. "  2. Essential HTN- DBP elevated. Starting IV diltiazem.  3. Acute on chronic Diastolic CHF: Exacerbated when she has sustained atrial flutter with pulmonary edema. Over 2 L output on IV Lasix 40 mg bid, which I would continue.  4. Obesity with possible sleep apnea. Recommend she follow through with formal sleep study and be treated if she has significant OSA as this may help reduce her atrial flutter. Recommend regular aerobic exercise and weight loss.     Prentice Docker, M.D., F.A.C.C.

## 2015-08-13 NOTE — Progress Notes (Signed)
**Note De-Identified Gryphon Vanderveen Obfuscation** Patient called for RT with SOB and congestive cough, no wheeze.  Xopenex .63mg  HHN Q6 PRN ordered and administered.  Flutter ordered to help clear secretions.  RRT to continue to monitor.

## 2015-08-13 NOTE — Progress Notes (Signed)
PROGRESS NOTE    Lindsay Whitehead  BMW:[id number] DOB: 01-13-1946 DOA: 08/12/2015 PCP: Wilson Singer, MD     Brief Narrative:  70 year old woman admitted on 6/21 with complaints of palpitations and shortness of breath. Found to be in A. fib with RVR. She has been started on multiple therapies and has been advised to have an ablation and she has refused or discontinued all these medications shortly after starting them for a variety of reasons.   Assessment & Plan:   Active Problems:   Atrial fibrillation with RVR (HCC)   Non compliance with medical treatment   HTN (hypertension)   Acute respiratory failure with hypoxia (HCC)   Acute on chronic diastolic (congestive) heart failure (HCC)   Pulmonary edema cardiac cause (HCC)   Atrial fibrillation with rapid ventricular response -Rates remain uncontrolled in the 140s to 160s. -Has been transitioned over to IV Cardizem by cardiology today and started on a heparin drip. -After long discussion with patient and her husband in room, she has agreed to start Eliquis for anticoagulation "only until I can have my ablation procedure and then I will stop it". She has been advised that where she to have bleeding in her stool in the future the correct response would be to have it evaluated with endoscopy and colonoscopy instead of simply discontinuing the medication. -She is also agreeable to oral Cardizem once she has been transitioned off IV Cardizem.   Acute hypoxemic respiratory failure -Due to pulmonary edema in the setting of A. fib with RVR and acute on chronic diastolic CHF -Did require BiPAP transiently, is now saturating well on nasal cannula oxygen. -She is close to 2 L negative since admission. -Plan to continue IV Lasix today.  Acute on chronic diastolic CHF -Exacerbated when she has sustained A. fib/flutter with pulmonary edema, see above for details.  Morbid obesity with possible sleep apnea -She has again been advised to  follow through with formal sleep study. She states "one thing at a time, I have already agreed to take medications you cannot expect me to do everything at once".  Hypertension -Should improve with initiation of Cardizem.     DVT prophylaxis: on eliquis Code Status: Full code Family Communication: Husband Alex at bedside updated on plan of care and all questions answered Disposition Plan: Keep in ICU  Consultants:   Cardiology  Procedures:   None  Antimicrobials:   None    Subjective: Sitting in bed, feels symptomatically improved, very controversial and argumentative when discussing her medication regimen.  Objective: Filed Vitals:   08/13/15 0800 08/13/15 0828 08/13/15 0900 08/13/15 0905  BP:    101/61  Pulse: 112  125 123  Temp:      TempSrc:      Resp: 19  16 16   Height:      Weight:      SpO2: 100% 100% 93% 95%    Intake/Output Summary (Last 24 hours) at 08/13/15 1019 Last data filed at 08/13/15 0900  Gross per 24 hour  Intake 1135.14 ml  Output   3100 ml  Net -1964.86 ml   Filed Weights   08/12/15 0749 08/13/15 0500  Weight: 96.163 kg (212 lb) 95.7 kg (210 lb 15.7 oz)    Examination:  General exam: Alert, awake, oriented x 3 Respiratory system: Mild bibasilar crackles Cardiovascular system: Irregular rate and rhythm, tachycardic, no murmurs, rubs or gallops Gastrointestinal system: Abdomen is nondistended, soft and nontender. No organomegaly or masses felt. Normal bowel sounds heard. Central nervous  system: Alert and oriented. No focal neurological deficits. Extremities: 1+ pitting edema bilaterally Skin: No rashes, lesions or ulcers Psychiatry: . Mood & affect appropriate.   Poor judgment and insight into her disease process.    Data Reviewed: I have personally reviewed following labs and imaging studies  CBC:  Recent Labs Lab 08/12/15 0806 08/13/15 0445  WBC 16.0* 11.1*  HGB 13.2 11.9*  HCT 41.4 37.2  MCV 92.0 91.4  PLT 227 205    Basic Metabolic Panel:  Recent Labs Lab 08/12/15 0806  NA 141  K 3.4*  CL 99*  CO2 33*  GLUCOSE 98  BUN 15  CREATININE 1.00  CALCIUM 9.1   GFR: Estimated Creatinine Clearance: 63.3 mL/min (by C-G formula based on Cr of 1). Liver Function Tests: No results for input(s): AST, ALT, ALKPHOS, BILITOT, PROT, ALBUMIN in the last 168 hours. No results for input(s): LIPASE, AMYLASE in the last 168 hours. No results for input(s): AMMONIA in the last 168 hours. Coagulation Profile:  Recent Labs Lab 08/12/15 0806  INR 1.20   Cardiac Enzymes: No results for input(s): CKTOTAL, CKMB, CKMBINDEX, TROPONINI in the last 168 hours. BNP (last 3 results) No results for input(s): PROBNP in the last 8760 hours. HbA1C: No results for input(s): HGBA1C in the last 72 hours. CBG: No results for input(s): GLUCAP in the last 168 hours. Lipid Profile: No results for input(s): CHOL, HDL, LDLCALC, TRIG, CHOLHDL, LDLDIRECT in the last 72 hours. Thyroid Function Tests:  Recent Labs  08/12/15 0806  TSH 5.176*   Anemia Panel: No results for input(s): VITAMINB12, FOLATE, FERRITIN, TIBC, IRON, RETICCTPCT in the last 72 hours. Urine analysis:    Component Value Date/Time   COLORURINE YELLOW 08/12/2015 1150   APPEARANCEUR CLEAR 08/12/2015 1150   LABSPEC 1.010 08/12/2015 1150   PHURINE 6.0 08/12/2015 1150   GLUCOSEU NEGATIVE 08/12/2015 1150   HGBUR NEGATIVE 08/12/2015 1150   BILIRUBINUR NEGATIVE 08/12/2015 1150   KETONESUR NEGATIVE 08/12/2015 1150   PROTEINUR NEGATIVE 08/12/2015 1150   UROBILINOGEN 0.2 07/18/2014 1158   NITRITE NEGATIVE 08/12/2015 1150   LEUKOCYTESUR NEGATIVE 08/12/2015 1150   Sepsis Labs: (procalcitonin:4,lacticidven:4)  )No results found for this or any previous visit (from the past 240 hour(s)).       Radiology Studies: Dg Chest Port 1 View  08/12/2015  CLINICAL DATA:  Shortness of breath and RIGHT shoulder pain since this morning, shortness of breath  worsening, on antiviral meds since 08/05/2015, hypertension, atrial fibrillation, CHF EXAM: PORTABLE CHEST 1 VIEW COMPARISON:  Portable exam 0813 hours compared to 11/17/2014 FINDINGS: Enlargement of cardiac silhouette with pulmonary vascular congestion. Atherosclerotic calcification aorta. Mild perihilar infiltrates likely representing pulmonary edema. No segmental consolidation, pleural effusion or pneumothorax. Bones unremarkable. IMPRESSION: Enlargement of cardiac silhouette with pulmonary vascular congestion and mild pulmonary edema/CHF. Aortic atherosclerosis. Electronically Signed   By: Ulyses Southward M.D.   On: 08/12/2015 08:28        Scheduled Meds: . antiseptic oral rinse  7 mL Mouth Rinse q12n4p  . chlorhexidine  15 mL Mouth Rinse BID  . furosemide  40 mg Intravenous Q12H   Continuous Infusions: . diltiazem (CARDIZEM) infusion 10 mg/hr (08/13/15 0950)     LOS: 1 day    Time spent: 25 minutes. Greater than 50% of this time was spent in direct contact with the patient coordinating care.     Chaya Jan, MD [facility] Pager [phone]  If 7PM-7AM, please contact night-coverage [email or web address] Password [facility] 08/13/2015, 10:19 AM

## 2015-08-13 NOTE — Progress Notes (Signed)
ANTICOAGULATION CONSULT NOTE - Follow up  Pharmacy Consult for Eliquis Indication: atrial fibrillation  Allergies  Allergen Reactions  . Bystolic [Nebivolol Hcl] Other (See Comments)    Extreme chest pains/ lowering of blood pressure causing dizziness, falling  . Metoprolol Other (See Comments)    Extreme drop in blood pressure, vertigo resulted  . Asa [Aspirin]     Bleeding gums  . Diltiazem     bleeding gums, stomach ache,weekness   . Edarbi [Azilsartan] Hives  . Lasix [Furosemide] Other (See Comments)    Cramping in thigh area   . Zantac [Ranitidine Hcl]     Patient Measurements: Height: 5\' 8"  (172.7 cm) Weight: 210 lb 15.7 oz (95.7 kg) IBW/kg (Calculated) : 63.9 HEPARIN DW (KG): 84.8  Vital Signs: Temp: 98.1 F (36.7 C) (06/22 0748) Temp Source: Oral (06/22 0748) BP: 101/61 mmHg (06/22 0905) Pulse Rate: 123 (06/22 0905)  Labs:  Recent Labs  08/12/15 0806 08/12/15 1853 08/13/15 0445 08/13/15 0756  HGB 13.2  --  11.9*  --   HCT 41.4  --  37.2  --   PLT 227  --  205  --   LABPROT 15.3*  --   --   --   INR 1.20  --   --   --   HEPARINUNFRC  --  0.54 <0.10* 0.34  CREATININE 1.00  --   --   --     Estimated Creatinine Clearance: 63.3 mL/min (by C-G formula based on Cr of 1).   Medical History: Past Medical History  Diagnosis Date  . Vertigo   . Hypertension   . Episodic atrial fibrillation (HCC)     Converted to NSR recent hospitalization 01/2013 on Dilt  . Atrial flutter (HCC)   . Right bundle branch block     Appears to be chronic  . Hypercholesterolemia   . Obesity   . Fatigue   . Diastolic CHF (HCC)     Medications:  See med rec  Assessment: 70 yo present with acute onset SOB found to be in afib She was started on heparin and now will transition to eliquis.   Goal of Therapy:  Therapeutic anticoagulation Monitor platelets by anticoagulation protocol: Yes   Plan:  Eliquis 5 mg po bid Monitor CBC, platelets Monitor for  bleeding  Talbert Cage Clinical Pharmacist 08/13/2015,10:27 AM

## 2015-08-13 NOTE — Progress Notes (Signed)
In to see patient at this time, patient at the current time has persistent elevated HR with rates in the 170's-180's. Patient is currently on 15mg /hr of cardizem with BP's 90-110/60-70, and patient reports light SOB when heart rates exceed 160. Willey Blade notified, MD Nedra Hai to follow up and spoken to at this time.  New orders received at this time for 10mg  bolus cardizem.  Will continue to monitor the patient closely.

## 2015-08-13 NOTE — Progress Notes (Signed)
ANTICOAGULATION CONSULT NOTE - Follow up  Pharmacy Consult for Heparin Indication: atrial fibrillation  Allergies  Allergen Reactions  . Bystolic [Nebivolol Hcl] Other (See Comments)    Extreme chest pains/ lowering of blood pressure causing dizziness, falling  . Metoprolol Other (See Comments)    Extreme drop in blood pressure, vertigo resulted  . Asa [Aspirin]     Bleeding gums  . Diltiazem     bleeding gums, stomach ache,weekness   . Edarbi [Azilsartan] Hives  . Lasix [Furosemide] Other (See Comments)    Cramping in thigh area   . Zantac [Ranitidine Hcl]     Patient Measurements: Height: 5\' 8"  (172.7 cm) Weight: 210 lb 15.7 oz (95.7 kg) IBW/kg (Calculated) : 63.9 HEPARIN DW (KG): 84.8  Vital Signs: Temp: 98.1 F (36.7 C) (06/22 0748) Temp Source: Oral (06/22 0748) BP: 121/102 mmHg (06/22 0700) Pulse Rate: 118 (06/22 0700)  Labs:  Recent Labs  08/12/15 0806 08/12/15 1853 08/13/15 0445 08/13/15 0756  HGB 13.2  --  11.9*  --   HCT 41.4  --  37.2  --   PLT 227  --  205  --   LABPROT 15.3*  --   --   --   INR 1.20  --   --   --   HEPARINUNFRC  --  0.54 <0.10* 0.34  CREATININE 1.00  --   --   --     Estimated Creatinine Clearance: 63.3 mL/min (by C-G formula based on Cr of 1).   Medical History: Past Medical History  Diagnosis Date  . Vertigo   . Hypertension   . Episodic atrial fibrillation (HCC)     Converted to NSR recent hospitalization 01/2013 on Dilt  . Atrial flutter (HCC)   . Right bundle branch block     Appears to be chronic  . Hypercholesterolemia   . Obesity   . Fatigue   . Diastolic CHF (HCC)     Medications:  See med rec  Assessment: 70 yo present with acute onset SOB this Am. She has been having palpitations and previously diagnosed with atrial fibrillation. Heparin for atrial fibrillation.  Heparin level at goal  Goal of Therapy:  Heparin level 0.3-0.7 units/ml Monitor platelets by anticoagulation protocol: Yes   Plan:   Continue heparin at 1200 units/hour Heparin level daily while on heparin Monitor CBC, platelets Monitor for bleeding  Talbert Cage Clinical Pharmacist 08/13/2015,9:12 AM

## 2015-08-14 LAB — CBC
HCT: 37.2 % (ref 36.0–46.0)
HEMOGLOBIN: 11.8 g/dL — AB (ref 12.0–15.0)
MCH: 29 pg (ref 26.0–34.0)
MCHC: 31.7 g/dL (ref 30.0–36.0)
MCV: 91.4 fL (ref 78.0–100.0)
PLATELETS: 195 10*3/uL (ref 150–400)
RBC: 4.07 MIL/uL (ref 3.87–5.11)
RDW: 14.6 % (ref 11.5–15.5)
WBC: 10.1 10*3/uL (ref 4.0–10.5)

## 2015-08-14 LAB — BASIC METABOLIC PANEL
ANION GAP: 7 (ref 5–15)
BUN: 26 mg/dL — ABNORMAL HIGH (ref 6–20)
CALCIUM: 8.2 mg/dL — AB (ref 8.9–10.3)
CO2: 31 mmol/L (ref 22–32)
Chloride: 100 mmol/L — ABNORMAL LOW (ref 101–111)
Creatinine, Ser: 0.9 mg/dL (ref 0.44–1.00)
Glucose, Bld: 76 mg/dL (ref 65–99)
Potassium: 3.8 mmol/L (ref 3.5–5.1)
SODIUM: 138 mmol/L (ref 135–145)

## 2015-08-14 MED ORDER — DILTIAZEM HCL 30 MG PO TABS
30.0000 mg | ORAL_TABLET | Freq: Three times a day (TID) | ORAL | Status: DC
  Administered 2015-08-14 – 2015-08-15 (×3): 30 mg via ORAL
  Filled 2015-08-14 (×2): qty 1

## 2015-08-14 MED ORDER — INSULIN ASPART 100 UNIT/ML ~~LOC~~ SOLN: 10.0000 [IU] | Freq: Once | SUBCUTANEOUS | Status: DC

## 2015-08-14 MED ORDER — DILTIAZEM HCL 30 MG PO TABS
30.0000 mg | ORAL_TABLET | Freq: Three times a day (TID) | ORAL | Status: DC
  Administered 2015-08-14: 30 mg via ORAL
  Filled 2015-08-14 (×2): qty 1

## 2015-08-14 NOTE — Progress Notes (Signed)
PROGRESS NOTE    Lindsay Whitehead  XMI:[id number] DOB: 09-09-1945 DOA: 08/12/2015 PCP: Wilson Singer, MD     Brief Narrative:  70 year old woman admitted on 6/21 with complaints of palpitations and shortness of breath. Found to be in A. fib with RVR. She has been started on multiple therapies and has been advised to have an ablation and she has refused or discontinued all these medications shortly after starting them for a variety of reasons. As of 6/23 she has converted to sinus rhythm, oral Cardizem has been started.   Assessment & Plan:   Active Problems:   Atrial fibrillation with RVR (HCC)   Non compliance with medical treatment   HTN (hypertension)   Acute respiratory failure with hypoxia (HCC)   Acute on chronic diastolic (congestive) heart failure (HCC)   Pulmonary edema cardiac cause (HCC)   Atrial fibrillation with rapid ventricular response -She currently has converted to sinus rhythm, she is, at least for now, agreeing to continue by mouth Cardizem as well as anticoagulation with Eliquis. -She is also interested in following up with Dr. Ladona Ridgel for potential ablation procedure.   Acute hypoxemic respiratory failure -Due to pulmonary edema in the setting of A. fib with RVR and acute on chronic diastolic CHF -Did require BiPAP transiently, is now saturating well on nasal cannula oxygen. -She is  2.5 L negative since admission. -Plan to continue IV Lasix today.  Acute on chronic diastolic CHF -Exacerbated when she has sustained A. fib/flutter with pulmonary edema, see above for details.  Morbid obesity with possible sleep apnea -She has again been advised to follow through with formal sleep study. She states "one thing at a time, I have already agreed to take medications you cannot expect me to do everything at once".  Hypertension -Fair control     DVT prophylaxis: on eliquis Code Status: Full code Family Communication: Patient only Disposition Plan:  Transfer to telemetry, hope for discharge home her next 24 hours  Consultants:   Cardiology  Procedures:   None  Antimicrobials:   None    Subjective: Sitting in bed, feels symptomatically improved, very controversial and argumentative when discussing her medication regimen.  Objective: Filed Vitals:   08/14/15 1200 08/14/15 1300 08/14/15 1400 08/14/15 1500  BP:   117/59 124/66  Pulse:  63 68 62  Temp: 97.7 F (36.5 C)     TempSrc: Oral     Resp:  18 19 20   Height:      Weight:      SpO2:  96% 96% 95%    Intake/Output Summary (Last 24 hours) at 08/14/15 1658 Last data filed at 08/14/15 1300  Gross per 24 hour  Intake   1035 ml  Output    600 ml  Net    435 ml   Filed Weights   08/12/15 0749 08/13/15 0500 08/14/15 0500  Weight: 96.163 kg (212 lb) 95.7 kg (210 lb 15.7 oz) 94.1 kg (207 lb 7.3 oz)    Examination:  General exam: Alert, awake, oriented x 3 Respiratory system: Mild bibasilar crackles Cardiovascular system: Regular rate and rhythm, no murmurs, rubs or gallops Gastrointestinal system: Abdomen is nondistended, soft and nontender. No organomegaly or masses felt. Normal bowel sounds heard. Central nervous system: Alert and oriented. No focal neurological deficits. Extremities: 1+ pitting edema bilaterally Skin: No rashes, lesions or ulcers Psychiatry: . Mood & affect appropriate.   Poor judgment and insight into her disease process.    Data Reviewed: I have personally reviewed following  labs and imaging studies  CBC:  Recent Labs Lab 08/12/15 0806 08/13/15 0445 08/14/15 0431  WBC 16.0* 11.1* 10.1  HGB 13.2 11.9* 11.8*  HCT 41.4 37.2 37.2  MCV 92.0 91.4 91.4  PLT 227 205 195   Basic Metabolic Panel:  Recent Labs Lab 08/12/15 0806 08/14/15 0431  NA 141 138  K 3.4* 3.8  CL 99* 100*  CO2 33* 31  GLUCOSE 98 76  BUN 15 26*  CREATININE 1.00 0.90  CALCIUM 9.1 8.2*   GFR: Estimated Creatinine Clearance: 69.8 mL/min (by C-G formula  based on Cr of 0.9). Liver Function Tests: No results for input(s): AST, ALT, ALKPHOS, BILITOT, PROT, ALBUMIN in the last 168 hours. No results for input(s): LIPASE, AMYLASE in the last 168 hours. No results for input(s): AMMONIA in the last 168 hours. Coagulation Profile:  Recent Labs Lab 08/12/15 0806  INR 1.20   Cardiac Enzymes: No results for input(s): CKTOTAL, CKMB, CKMBINDEX, TROPONINI in the last 168 hours. BNP (last 3 results) No results for input(s): PROBNP in the last 8760 hours. HbA1C: No results for input(s): HGBA1C in the last 72 hours. CBG: No results for input(s): GLUCAP in the last 168 hours. Lipid Profile: No results for input(s): CHOL, HDL, LDLCALC, TRIG, CHOLHDL, LDLDIRECT in the last 72 hours. Thyroid Function Tests:  Recent Labs  08/12/15 0806  TSH 5.176*   Anemia Panel: No results for input(s): VITAMINB12, FOLATE, FERRITIN, TIBC, IRON, RETICCTPCT in the last 72 hours. Urine analysis:    Component Value Date/Time   COLORURINE YELLOW 08/12/2015 1150   APPEARANCEUR CLEAR 08/12/2015 1150   LABSPEC 1.010 08/12/2015 1150   PHURINE 6.0 08/12/2015 1150   GLUCOSEU NEGATIVE 08/12/2015 1150   HGBUR NEGATIVE 08/12/2015 1150   BILIRUBINUR NEGATIVE 08/12/2015 1150   KETONESUR NEGATIVE 08/12/2015 1150   PROTEINUR NEGATIVE 08/12/2015 1150   UROBILINOGEN 0.2 07/18/2014 1158   NITRITE NEGATIVE 08/12/2015 1150   LEUKOCYTESUR NEGATIVE 08/12/2015 1150   Sepsis Labs: (procalcitonin:4,lacticidven:4)  )No results found for this or any previous visit (from the past 240 hour(s)).       Radiology Studies: No results found.      Scheduled Meds: . antiseptic oral rinse  7 mL Mouth Rinse q12n4p  . apixaban  5 mg Oral BID  . chlorhexidine  15 mL Mouth Rinse BID  . diltiazem  30 mg Oral Q8H  . furosemide  40 mg Intravenous Q12H   Continuous Infusions: . diltiazem (CARDIZEM) infusion Stopped (08/13/15 2200)     LOS: 2 days    Time spent: 25  minutes. Greater than 50% of this time was spent in direct contact with the patient coordinating care.     Chaya Jan, MD [facility] Pager [phone]  If 7PM-7AM, please contact night-coverage [email or web address] Password [facility] 08/14/2015, 4:58 PM

## 2015-08-14 NOTE — Progress Notes (Signed)
In to see patient at this time, patient noted to have converted to sinus brady/ junctional rhythm with heart rates in the 45-48 range.  The patient is asymptomatic, with BP 100-105/60-70 on RA 98%.  MD Nedra Hai notified at this time of patient HR and condition.  Patient right now stable going in and out between sinus brady and a junctional rhythm.  Will continue to monitor the patient closely.

## 2015-08-14 NOTE — Care Management Important Message (Signed)
Important Message  Patient Details  Name: Lindsay Whitehead MRN: [id number] Date of Birth: 1945-12-21   Medicare Important Message Given:  Yes    Malcolm Metro, RN 08/14/2015, 12:42 PM

## 2015-08-14 NOTE — Care Management Note (Signed)
Case Management Note  Patient Details  Name: Kadyn Prizzi MRN: [id number] Date of Birth: Nov 17, 1945  Subjective/Objective:                  Pt is from home, lives with husband and is ind with ADL's. Pt has PCP, transportation to appointments and no difficulty affording medications. Pt plans to return home with self care. No CM needs.   Action/Plan: No CM needs anticipated.   Expected Discharge Date:    08/15/2015              Expected Discharge Plan:  Home/Self Care  In-House Referral:  NA  Discharge planning Services  CM Consult  Post Acute Care Choice:  NA Choice offered to:  NA  DME Arranged:    DME Agency:     HH Arranged:    HH Agency:     Status of Service:  Completed, signed off  If discussed at [employer] of Stay Meetings, dates discussed:    Additional Comments:  Malcolm Metro, RN 08/14/2015, 12:42 PM

## 2015-08-14 NOTE — Progress Notes (Signed)
Pt tranffered to 339 via wheelchair, son at bedside, belongings w/ pt.

## 2015-08-14 NOTE — Care Management Obs Status (Signed)
MEDICARE OBSERVATION STATUS NOTIFICATION   Patient Details  Name: Lindsay Whitehead MRN: [id number] Date of Birth: [birth year]   Medicare Observation Status Notification Given:  Yes    Malcolm Metro, RN 08/14/2015, 11:49 AM

## 2015-08-15 MED ORDER — POTASSIUM CHLORIDE ER 10 MEQ PO TBCR: 10.0000 meq | EXTENDED_RELEASE_TABLET | Freq: Every day | ORAL | Status: DC

## 2015-08-15 MED ORDER — APIXABAN 5 MG PO TABS: 5.0000 mg | ORAL_TABLET | Freq: Two times a day (BID) | ORAL | Status: DC

## 2015-08-15 MED ORDER — FUROSEMIDE 40 MG PO TABS: 40.0000 mg | ORAL_TABLET | Freq: Every day | ORAL | Status: DC

## 2015-08-15 MED ORDER — DILTIAZEM HCL 30 MG PO TABS: 30.0000 mg | ORAL_TABLET | Freq: Three times a day (TID) | ORAL | Status: DC

## 2015-08-15 NOTE — Discharge Summary (Signed)
Physician Discharge Summary  Lindsay Whitehead ZOX:[id number] DOB: 11/02/1945 DOA: 08/12/2015  PCP: Wilson Singer, MD  Admit date: 08/12/2015 Discharge date: 08/15/2015  Time spent: 45 minutes  Recommendations for Outpatient Follow-up:  -Will be discharged home today. -Advised to follow-up with cardiologist in 2 weeks.   Discharge Diagnoses:  Active Problems:   Atrial fibrillation with RVR (HCC)   Non compliance with medical treatment   HTN (hypertension)   Acute respiratory failure with hypoxia (HCC)   Acute on chronic diastolic (congestive) heart failure (HCC)   Pulmonary edema cardiac cause [facility])   Discharge Condition: Stable and improved  Filed Weights   08/13/15 0500 08/14/15 0500 08/15/15 0549  Weight: 95.7 kg (210 lb 15.7 oz) 94.1 kg (207 lb 7.3 oz) 90.719 kg (200 lb)    History of present illness:  As per Dr. Elvera Lennox on 6/21: Lindsay Whitehead is a 70 y.o. female with medical history significant of PAF, HLD, RBBB, HTN, CHF pEF, medical non compliance, presents to the ED with chief complaint of palpitations and shortness of breath of sudden onset this morning around 4 am. She denies chest pain. She has no abdominal pain, nausea or vomiting. She has a long standing history of self - discontinuing her medications including Amiodarone, Eliquis, due to multiple side effects / intolerances, well documented in outpatient cardiology visits. Currently she is taking no cardiac medications. She complains of streaks of blood in her stool while on Eliquis, now resolved off of the blood thinner. She is intolerant to BB and CCB, although inpatient she has tolerated in the past. Denies current fever and chills. Complains of vaginal itching for which she is being evaluated in GYN clinic as an outpatient, thought to have herpes infection however IgG testing returned negative and is supposed to have vulvar biopsy soon. She thinks her A fib is due to this as it is being very "stressful".  Started on a thyroid medication (not in her medication list, ?) by PCP. Unknown recent TSH, most recent one in 2016 normal. In the ER she was found to be in A fib with RVR with rates 160-180s, started on Amiodarone gtt due to above mentioned intolerances. CXR shows pulmonary edema and she was hypoxic requiring BiPAP. [facility] asked for SDU admission   Hospital Course:   Atrial fibrillation with rapid ventricular response -She currently has converted to sinus rhythm, she is, at least for now, agreeing to continue by mouth Cardizem as well as anticoagulation with Eliquis. -She is also interested in following up with Dr. Ladona Ridgel for potential ablation procedure.  Acute hypoxemic respiratory failure -Due to pulmonary edema in the setting of A. fib with RVR and acute on chronic diastolic CHF -Did require BiPAP transiently, is now saturating well on nasal cannula oxygen. -She is 2.5 L negative since admission.  Acute on chronic diastolic CHF -Exacerbated when she has sustained A. fib/flutter with pulmonary edema, see above for details.  Morbid obesity with possible sleep apnea -She has again been advised to follow through with formal sleep study. She states "one thing at a time, I have already agreed to take medications you cannot expect me to do everything at once".  Hypertension -Fair control  Procedures:  None   Consultations:  Cardiology  Discharge Instructions  Discharge Instructions    Amb referral to AFIB Clinic    Complete by:  As directed      Diet - low sodium heart healthy    Complete by:  As directed  Increase activity slowly    Complete by:  As directed             Medication List    STOP taking these medications        diphenhydrAMINE 25 MG tablet  Commonly known as:  BENADRYL     predniSONE 50 MG tablet  Commonly known as:  DELTASONE     valACYclovir 1000 MG tablet  Commonly known as:  VALTREX      TAKE these medications        apixaban 5 MG Tabs  tablet  Commonly known as:  ELIQUIS  Take 1 tablet (5 mg total) by mouth 2 (two) times daily.     diltiazem 30 MG tablet  Commonly known as:  CARDIZEM  Take 1 tablet (30 mg total) by mouth every 8 (eight) hours.     furosemide 40 MG tablet  Commonly known as:  LASIX  Take 1 tablet (40 mg total) by mouth daily.     potassium chloride 10 MEQ tablet  Commonly known as:  K-DUR  Take 1 tablet (10 mEq total) by mouth daily.     VITAMIN D (ERGOCALCIFEROL) PO  Take 7,000 mg by mouth daily.       Allergies  Allergen Reactions  . Bystolic [Nebivolol Hcl] Other (See Comments)    Extreme chest pains/ lowering of blood pressure causing dizziness, falling  . Metoprolol Other (See Comments)    Extreme drop in blood pressure, vertigo resulted  . Asa [Aspirin]     Bleeding gums  . Diltiazem     bleeding gums, stomach ache,weekness   . Edarbi [Azilsartan] Hives  . Lasix [Furosemide] Other (See Comments)    Cramping in thigh area   . Zantac [Ranitidine Hcl]        Follow-up Information    Follow up with Wilson Singer, MD. Schedule an appointment as soon as possible for a visit in 2 weeks.   Specialty:  Internal Medicine   Contact information:   [address] [place] [address] [phone]        The results of significant diagnostics from this hospitalization (including imaging, microbiology, ancillary and laboratory) are listed below for reference.    Significant Diagnostic Studies: Dg Chest Port 1 View  08/12/2015  CLINICAL DATA:  Shortness of breath and RIGHT shoulder pain since this morning, shortness of breath worsening, on antiviral meds since 08/05/2015, hypertension, atrial fibrillation, CHF EXAM: PORTABLE CHEST 1 VIEW COMPARISON:  Portable exam 0813 hours compared to 11/17/2014 FINDINGS: Enlargement of cardiac silhouette with pulmonary vascular congestion. Atherosclerotic calcification aorta. Mild perihilar infiltrates likely representing pulmonary edema. No  segmental consolidation, pleural effusion or pneumothorax. Bones unremarkable. IMPRESSION: Enlargement of cardiac silhouette with pulmonary vascular congestion and mild pulmonary edema/CHF. Aortic atherosclerosis. Electronically Signed   By: Ulyses Southward M.D.   On: 08/12/2015 08:28    Microbiology: No results found for this or any previous visit (from the past 240 hour(s)).   Labs: Basic Metabolic Panel:  Recent Labs Lab 08/12/15 0806 08/14/15 0431  NA 141 138  K 3.4* 3.8  CL 99* 100*  CO2 33* 31  GLUCOSE 98 76  BUN 15 26*  CREATININE 1.00 0.90  CALCIUM 9.1 8.2*   Liver Function Tests: No results for input(s): AST, ALT, ALKPHOS, BILITOT, PROT, ALBUMIN in the last 168 hours. No results for input(s): LIPASE, AMYLASE in the last 168 hours. No results for input(s): AMMONIA in the last 168 hours. CBC:  Recent Labs  Lab 08/12/15 0806 08/13/15 0445 08/14/15 0431  WBC 16.0* 11.1* 10.1  HGB 13.2 11.9* 11.8*  HCT 41.4 37.2 37.2  MCV 92.0 91.4 91.4  PLT 227 205 195   Cardiac Enzymes: No results for input(s): CKTOTAL, CKMB, CKMBINDEX, TROPONINI in the last 168 hours. BNP: BNP (last 3 results)  Recent Labs  11/17/14 1229 11/18/14 0558 08/12/15 0806  BNP 352.0* 287.0* 811.0*    ProBNP (last 3 results) No results for input(s): PROBNP in the last 8760 hours.  CBG: No results for input(s): GLUCAP in the last 168 hours.     SignedChaya Jan  [facility] Pager: [phone] 08/15/2015, 3:51 PM

## 2015-08-15 NOTE — Progress Notes (Signed)
Discharge instructions read to patient.  Patient verbalized understanding of all instructions. Discharged to home with spouse

## 2015-08-17 ENCOUNTER — Ambulatory Visit: Payer: Commercial Managed Care - HMO | Admitting: Obstetrics & Gynecology

## 2015-08-19 ENCOUNTER — Ambulatory Visit: Payer: Commercial Managed Care - HMO | Admitting: Obstetrics & Gynecology

## 2015-08-24 ENCOUNTER — Other Ambulatory Visit: Payer: Self-pay | Admitting: *Deleted

## 2015-08-24 NOTE — Patient Outreach (Signed)
[facility]) Care Management  08/24/2015  Lindsay Whitehead [birth year] [id number]  Referral from [facility]-recent hospital admission at [facility] 6/12-6/24/2017. [employer].  Telephone call to patient; left message on voice mail requesting call back.   Plan: Will follow up.  Colleen Can, RN BSN CCM Care Management Coordinator [facility] Care Management  [phone]

## 2015-08-26 ENCOUNTER — Emergency Department (HOSPITAL_COMMUNITY)
Admission: EM | Admit: 2015-08-26 | Discharge: 2015-08-26 | Disposition: A | Discharge status: Home / Self Care | Payer: Commercial Managed Care - HMO | Attending: Emergency Medicine | Admitting: Emergency Medicine

## 2015-08-26 ENCOUNTER — Other Ambulatory Visit: Payer: Self-pay | Admitting: *Deleted

## 2015-08-26 ENCOUNTER — Encounter (HOSPITAL_COMMUNITY): Payer: Self-pay | Admitting: Emergency Medicine

## 2015-08-26 DIAGNOSIS — E669 Obesity, unspecified: Secondary | ICD-10-CM | POA: Insufficient documentation

## 2015-08-26 DIAGNOSIS — N390 Urinary tract infection, site not specified: Secondary | ICD-10-CM | POA: Insufficient documentation

## 2015-08-26 DIAGNOSIS — M7989 Other specified soft tissue disorders: Secondary | ICD-10-CM | POA: Diagnosis not present

## 2015-08-26 DIAGNOSIS — Z6831 Body mass index (BMI) 31.0-31.9, adult: Secondary | ICD-10-CM | POA: Insufficient documentation

## 2015-08-26 DIAGNOSIS — Z79899 Other long term (current) drug therapy: Secondary | ICD-10-CM | POA: Insufficient documentation

## 2015-08-26 DIAGNOSIS — L298 Other pruritus: Secondary | ICD-10-CM | POA: Diagnosis present

## 2015-08-26 DIAGNOSIS — I509 Heart failure, unspecified: Secondary | ICD-10-CM | POA: Insufficient documentation

## 2015-08-26 DIAGNOSIS — I110 Hypertensive heart disease with heart failure: Secondary | ICD-10-CM | POA: Insufficient documentation

## 2015-08-26 DIAGNOSIS — I5023 Acute on chronic systolic (congestive) heart failure: Secondary | ICD-10-CM

## 2015-08-26 DIAGNOSIS — I480 Paroxysmal atrial fibrillation: Secondary | ICD-10-CM | POA: Insufficient documentation

## 2015-08-26 DIAGNOSIS — R0602 Shortness of breath: Secondary | ICD-10-CM | POA: Diagnosis not present

## 2015-08-26 LAB — URINALYSIS, ROUTINE W REFLEX MICROSCOPIC
Bilirubin Urine: NEGATIVE
Glucose, UA: NEGATIVE mg/dL
Ketones, ur: NEGATIVE mg/dL
NITRITE: NEGATIVE
Specific Gravity, Urine: <1.005 — ABNORMAL LOW (ref 1.005–1.030)
pH: 7 (ref 5.0–8.0)

## 2015-08-26 LAB — I-STAT CHEM 8, ED
BUN: 13 mg/dL (ref 6–20)
CALCIUM ION: 1.13 mmol/L (ref 1.12–1.23)
CHLORIDE: 100 mmol/L — AB (ref 101–111)
CREATININE: 1 mg/dL (ref 0.44–1.00)
Glucose, Bld: 94 mg/dL (ref 65–99)
HEMATOCRIT: 39 % (ref 36.0–46.0)
Hemoglobin: 13.3 g/dL (ref 12.0–15.0)
Potassium: 4.1 mmol/L (ref 3.5–5.1)
SODIUM: 141 mmol/L (ref 135–145)
TCO2: 31 mmol/L (ref 0–100)

## 2015-08-26 LAB — URINE MICROSCOPIC-ADD ON

## 2015-08-26 MED ORDER — CEPHALEXIN 500 MG PO CAPS: 500.0000 mg | ORAL_CAPSULE | Freq: Two times a day (BID) | ORAL | Status: DC

## 2015-08-26 NOTE — ED Provider Notes (Signed)
CSN: [id number]     Arrival date & time 08/26/15  8295 History  By signing my name below, I, [name], attest that this documentation has been prepared under the direction and in the presence of Zadie Rhine, MD. Electronically Signed: [name], ED Scribe. 08/26/2015. 10:21 AM.   Chief Complaint  Patient presents with  . Vaginal Itching   Patient is a 70 y.o. female presenting with vaginal itching. The history is provided by the patient. No language interpreter was used.  Vaginal Itching This is a recurrent problem. The current episode started more than 2 days ago. Associated symptoms include shortness of breath. Pertinent negatives include no chest pain and no abdominal pain. Exacerbated by: scratching. Nothing relieves the symptoms. The treatment provided no relief.    HPI Comments: Lindsay Whitehead is a 70 y.o. female who presents to the Emergency Department complaining of moderate vaginal itching x 1 week. Pt states this is a recurrent issue and during her most recent bout with vaginal itching that she was dx with herpes 1 and 2. Her symptoms worsen when scratching the region. She has recently doubled her dosage of Lasix due to increased weight gain (~2 lbs per day) which began 1 week ago. She states that she spoke to her PCPs office and due to her PCP being out of the region was told to come to the ED due to her recent weight gain. She reports mild SOB with exertion and dysuria. She confirms her PMHx of a-fib and is currently on eliquis. She denies fevers, n/v, CP, abd pain, vaginal bleeding and vaginal discharge.   Past Medical History  Diagnosis Date  . Vertigo   . Hypertension   . Episodic atrial fibrillation (HCC)     Converted to NSR recent hospitalization 01/2013 on Dilt  . Atrial flutter (HCC)   . Right bundle branch block     Appears to be chronic  . Hypercholesterolemia   . Obesity   . Fatigue   . Diastolic CHF [facility])    Past Surgical History  Procedure  Laterality Date  . Wrist fracture surgery    . Carpel tunnel surgery     Family History  Problem Relation Age of Onset  . Cancer Other   . Cardiomyopathy Brother     Congenital with need for heart transplant. Died at 79.  . Asthma Mother   . Hypertension Mother   . COPD Father    Social History  Substance Use Topics  . Smoking status: Never Smoker   . Smokeless tobacco: Never Used  . Alcohol Use: 2.4 - 3.0 oz/week    4-5 Standard drinks or equivalent per week     Comment: rarely   OB History    Gravida Para Term Preterm AB TAB SAB Ectopic Multiple Living   Review of Systems  Constitutional: Positive for unexpected weight change. Negative for fever.  Respiratory: Positive for shortness of breath.   Cardiovascular: Positive for leg swelling. Negative for chest pain.  Gastrointestinal: Negative for nausea, vomiting and abdominal pain.  Genitourinary: Positive for dysuria. Negative for vaginal bleeding and vaginal discharge.       + vaginal itching  All other systems reviewed and are negative.  Allergies  Bystolic; Metoprolol; Asa; Diltiazem; Edarbi; Lasix; and Zantac  Home Medications   Prior to Admission medications   Medication Sig Start Date End Date Taking? Authorizing Provider  apixaban (ELIQUIS) 5 MG  TABS tablet Take 1 tablet (5 mg total) by mouth 2 (two) times daily. 08/15/15   Henderson Cloud, MD  diltiazem (CARDIZEM) 30 MG tablet Take 1 tablet (30 mg total) by mouth every 8 (eight) hours. 08/15/15   Henderson Cloud, MD  furosemide (LASIX) 40 MG tablet Take 1 tablet (40 mg total) by mouth daily. 08/15/15   Henderson Cloud, MD  potassium chloride (K-DUR) 10 MEQ tablet Take 1 tablet (10 mEq total) by mouth daily. 08/15/15   Henderson Cloud, MD  VITAMIN D, ERGOCALCIFEROL, PO Take 7,000 mg by mouth daily.    Historical Provider, MD   BP 173/84 mmHg  Pulse 68  Temp(Src) 98 F (36.7 C) (Oral)  Resp 20  Ht 5\' 8"   (1.727 m)  Wt 207 lb (93.895 kg)  BMI 31.48 kg/m2  SpO2 97% Physical Exam CONSTITUTIONAL: Well developed/well nourished HEAD: Normocephalic/atraumatic EYES: EOMI/PERRL ENMT: Mucous membranes moist NECK: supple no meningeal signs. No JVD.  SPINE/BACK:entire spine nontender CV: S1/S2 noted, no murmurs/rubs/gallops noted LUNGS: Lungs are clear to auscultation bilaterally, no apparent distress ABDOMEN: soft, nontender, no rebound or guarding, bowel sounds noted throughout abdomen GU:no cva tenderness. External genitalia without erythema, rash, discharge or bleeding. Nurse chaperone present.  NEURO: Pt is awake/alert/appropriate, moves all extremitiesx4.  No facial droop.   EXTREMITIES: pulses normal/equal, full ROM, no pitting edema noted.  SKIN: warm, color normal PSYCH: no abnormalities of mood noted, alert and oriented to situation ED Course  Procedures  DIAGNOSTIC STUDIES: Oxygen Saturation is 97% on RA, normal by my interpretation.    COORDINATION OF CARE: 10:12 AM Discussed next steps with pt. Pt verbalized understanding and is agreeable with the plan.  Patient left hospital on 6/24 at 90kg, now at 93kg  Labs Review Labs Reviewed  URINALYSIS, ROUTINE W REFLEX MICROSCOPIC (NOT AT [facility]) - Abnormal; Notable for the following:    Specific Gravity, Urine <1.005 (*)    Hgb urine dipstick TRACE (*)    Protein, ur TRACE (*)    Leukocytes, UA MODERATE (*)    All other components within normal limits  URINE MICROSCOPIC-ADD ON - Abnormal; Notable for the following:    Squamous Epithelial / LPF 0-5 (*)    Bacteria, UA FEW (*)    All other components within normal limits  I-STAT CHEM 8, ED - Abnormal; Notable for the following:    Chloride 100 (*)    All other components within normal limits   I have personally reviewed and evaluated these lab results as part of my medical decision-making.   Pt well appearing Nontoxic No signs of acute CHF She does have evidence of UTI Will  treat as outpatient  MDM   Final diagnoses:  UTI (lower urinary tract infection)    Nursing notes including past medical history and social history reviewed and considered in documentation Labs/vital reviewed myself and considered during evaluation   I personally performed the services described in this documentation, which was scribed in my presence. The recorded information has been reviewed and is accurate.        Zadie Rhine, MD 08/26/15 1225

## 2015-08-26 NOTE — ED Notes (Signed)
Patient complaining of itching and burning to vaginal area for over a week. Also states "I was just released here for fluid and they had to get the fluid off and now I've gained almost 10 pounds in a week." Denies shortness of breath or pain at this time. Patient ambulatory at triage.

## 2015-08-26 NOTE — ED Notes (Signed)
EDP at bedside updating patient. 

## 2015-08-26 NOTE — Patient Outreach (Signed)
[facility]) Care Management  08/26/2015  Lindsay Whitehead [birth year] [id number]   Referral from [facility]- recent hospital stay at [facility] 6/21-06/24/2017.  Received voice mail from patient responding to outreach message from this RN CM placed 07/03. Per chart review patient is currently at emergency department.  Plan:  Will follow up today.   1700-Return telephone call to patient who was advised of reason for call & of [facility] care management services. Patient  gave HIPPA verification. States she was most recently hospitalized for fluid build up with her heart failure. States she has been weighing herself daily and started putting weight on daily & developed shortness of breath and swelling of abdomen. States she was taking 2 lasix pills with no change in weight so she called primary care office today & was advised that MD was out of country. Patient states no other Md covering for MD so she went to emergency department. Patient states she was also having vaginal itching & burning. Voices that she was advised she had urinary tract infection & was placed on antibiotics and discharged to home.   Patient voices that she is aware of symptoms that she should report to MD as it relates to heart failure. States she has taken outpatient heart failure class in the past. Voices that she is able to get prescriptions filled at local pharmacy without problems. Voices that she is managing her own medication. Voices that she does have some dizziness when she takes 1 of heart pills. Voices she has not had any falls. States she has hospital follow up  appointment with primary care provider on 7/10 and will needs to see cardiologist when primary care office makes referral.   States she is still very independent but that spouse provides support and takes her to all of appointments currently. States she is wanting to change some of her doctors and has requested Engineer, civil (consulting) from her insurance company to get list of network providers in her area.   States she is also planning to go to her home town in [place] in August and wants to know if she will be covered by her insurance if she needs to get medical care. Patient was directed to call customer service for her insurance to inquire. Patient voices understanding.  Patient consents to [facility] care management services.   Assessment: Recent hospital stay with acute on chronic diastolic heart failure, unspecified atrial fibrillation;  Patient has workable scales and is weighing daily. Managing own medications; no trouble getting medications. Spouse providing transportation to MD appointments. Seen in emergency room with fluid build up -states gaining 2 lbs daily & was taking Lasix 40 mg tablets twice daily  for several days without results. Patient appropriate for transition of care program.  Plan: Will refer to community care coordinator for complex case management; recent hospital stay 6/21-6/24/2017. Seen in emergency room 08/26/2015. Will advise care management assistant to assign .  Colleen Can, RN BSN CCM Care Management Coordinator [facility] Care Management  [phone]

## 2015-08-26 NOTE — ED Notes (Signed)
MD at bedside. 

## 2015-09-07 ENCOUNTER — Telehealth: Payer: Self-pay | Admitting: Internal Medicine

## 2015-09-07 ENCOUNTER — Encounter: Payer: Self-pay | Admitting: *Deleted

## 2015-09-07 ENCOUNTER — Other Ambulatory Visit: Payer: Self-pay | Admitting: *Deleted

## 2015-09-07 NOTE — Telephone Encounter (Signed)
SPOKE WITH HOME  HEALTH  NURSE WHOM IS  CARING  FOR  PT,REASON FOR  CALL IS   CONCERNS RE  ITCHING  FOR  2  WEEKS  REVIEWED PT'S  CHART  AND APPEARS  PT  HAS  BEEN ON  DILTIAZEM SINCE  2014    NOT  SURE  WHY  HAS ITCHING FOR  2 WEEKS WILL CALL PT TO DISCUSS HAS  UPCOMING  APPT  WITH  DR  Ladona Ridgel .Zack Seal      LMTCB  .Zack Seal

## 2015-09-07 NOTE — Telephone Encounter (Signed)
F/u  Pt returning RN phone call. Please call back and discuss.   

## 2015-09-07 NOTE — Telephone Encounter (Signed)
Crystal is calling because she has concerns about the patient. Patient is going to discuss about itching( she has had this itch for 2wks now )  and she is on Diltiazem , and per Epic she is allegic to it . She takes it 3 times a day and is self medicating with Benadryl. She also has blood in her urine. Please call   Thanks

## 2015-09-07 NOTE — Telephone Encounter (Signed)
Pt states she has been treated by PCP/GYN for vaginal itching for a couple of weeks, pt currently only using benadryl.  Pt states she vaginal itching is worse 30-60 minutes after taking diltiazem and she thinks diltiazem may be cause of vaginal itching. Pt has follow up appt scheduled with [name] 09/10/15 in the [facility].  Pt advised I will forward to [name] for review.

## 2015-09-07 NOTE — Patient Outreach (Signed)
Transition of care call #2 completed. Mrs. Lindsay Whitehead is having a lot of health issues. She ended up in the hospital because her primary care MD went out of the country and she could not get her furosemide refilled. She has experienced many drug reactions which she is having one currently from taking Cardizem, vaginal rash. She will see Dr. Gilman Schmidt this week and she hopes she will be a candidate for cardiac ablation. She has admitted to a 4# weight gain since she was discharged. She is already taking 80 mg of lasix instead of 40 mg. She is also only on 10 meq of Potassium.  I have advised her to take 2 10 meq capsules of Potassium and if her wt gets to a 5# gain she should take an extra furosemide also. Please let Dr. Ladona Ridgel know exactly what she has taken this week.  I gave pt my number and told her she could call me. I also gave her our 24 hour nurse line number.  I will call her again in one week.  Almetta Lovely [facility] Care Manager [phone]

## 2015-09-08 NOTE — Telephone Encounter (Signed)
[name] is not in the office before her appointment and she can address these issues with him then

## 2015-09-08 NOTE — Telephone Encounter (Signed)
Will make sure Dr. Ladona Ridgel addresses this on Thursday.

## 2015-09-10 ENCOUNTER — Ambulatory Visit (INDEPENDENT_AMBULATORY_CARE_PROVIDER_SITE_OTHER): Payer: Commercial Managed Care - HMO | Admitting: Internal Medicine

## 2015-09-10 ENCOUNTER — Encounter: Payer: Self-pay | Admitting: Internal Medicine

## 2015-09-10 VITALS — BP 165/84 | HR 65 | Ht 68.0 in | Wt 205.0 lb

## 2015-09-10 DIAGNOSIS — R072 Precordial pain: Secondary | ICD-10-CM

## 2015-09-10 MED ORDER — VERAPAMIL HCL 40 MG PO TABS: 40.0000 mg | ORAL_TABLET | Freq: Three times a day (TID) | ORAL | Status: DC

## 2015-09-10 NOTE — Patient Instructions (Addendum)
Your physician recommends that you schedule a follow-up appointment in:  To be determined after tests,  If test normal 3 months [name]   STOP Diltiazem    START Verapamil 40 mg three times a day      Your physician has requested that you have a lexiscan myoview. For further information please visit [email or web address] Please follow instruction sheet, as given.       Thank you for choosing [facility] !

## 2015-09-10 NOTE — Progress Notes (Signed)
HPI Mrs. Mangione returns today for evaluation of paroxysmal atrial flutter and fibrillation. The patient is a very pleasant 70 yo woman who I saw 2 years ago with atrial flutter. Since then she has developed mostly coarse atrial fib with a RVR. Her episodes are paraoxysmal and quite debilitating. She does not like to take medications. She wants the problem "fixed". She also c/o sob and chest pain. Previously she had normal LV function by echo.  Allergies  Allergen Reactions  . Bystolic [Nebivolol Hcl] Other (See Comments)    Extreme chest pains/ lowering of blood pressure causing dizziness, falling  . Metoprolol Other (See Comments)    Extreme drop in blood pressure, vertigo resulted  . Asa [Aspirin]     Bleeding gums  . Diltiazem     bleeding gums, stomach ache,weekness   . Edarbi [Azilsartan] Hives  . Lasix [Furosemide] Other (See Comments)    Cramping in thigh area   . Zantac [Ranitidine Hcl]      Current Outpatient Prescriptions  Medication Sig Dispense Refill  . apixaban (ELIQUIS) 5 MG TABS tablet Take 1 tablet (5 mg total) by mouth 2 (two) times daily. 60 tablet 2  . esomeprazole (NEXIUM) 20 MG capsule Take 20 mg by mouth at bedtime. Reported on 09/07/2015    . furosemide (LASIX) 40 MG tablet Take 1 tablet (40 mg total) by mouth daily. (Patient taking differently: Take 40 mg by mouth 2 (two) times daily. ) 30 tablet 2  . Multiple Vitamin (MULTIVITAMIN WITH MINERALS) TABS tablet Take 1 tablet by mouth daily.    . potassium chloride (K-DUR) 10 MEQ tablet Take 1 tablet (10 mEq total) by mouth daily. 30 tablet 2   No current facility-administered medications for this visit.     Past Medical History  Diagnosis Date  . Vertigo   . Hypertension   . Episodic atrial fibrillation (HCC)     Converted to NSR recent hospitalization 01/2013 on Dilt  . Atrial flutter (HCC)   . Right bundle branch block     Appears to be chronic  . Hypercholesterolemia   . Obesity   .  Fatigue   . Diastolic CHF (HCC)     ROS:   All systems reviewed and negative except as noted in the HPI.   Past Surgical History  Procedure Laterality Date  . Wrist fracture surgery    . Carpel tunnel surgery       Family History  Problem Relation Age of Onset  . Cancer Other   . Cardiomyopathy Brother     Congenital with need for heart transplant. Died at 62.  . Asthma Mother   . Hypertension Mother   . COPD Father      Social History   Social History  . Marital Status: Married    Spouse Name: N/A  . Number of Children: 3  . Years of Education: N/A   Occupational History  . retired Hotel manager   Social History Main Topics  . Smoking status: Never Smoker   . Smokeless tobacco: Never Used  . Alcohol Use: No     Comment: rarely  . Drug Use: No  . Sexual Activity: Yes    Birth Control/ Protection: None   Other Topics Concern  . Not on file   Social History Narrative     BP 165/84 mmHg  Pulse 65  Ht 5\' 8"  (1.727 m)  Wt 205 lb (92.987 kg)  BMI  31.18 kg/m2  SpO2 99%  Physical Exam:  Well appearing 70 yo woman, NAD HEENT: Unremarkable Neck:  7 cm JVD, no thyromegally Back:  No CVA tenderness Lungs:  Clear with no wheezes. HEART:  Regular rate rhythm, no murmurs, no rubs, no clicks Abd:  soft, positive bowel sounds, no organomegally, no rebound, no guarding Ext:  2 plus pulses, no edema, no cyanosis, no clubbing Skin:  No rashes no nodules Neuro:  CN II through XII intact, motor grossly intact  Assess/Plan: 1. PAF - I have discussed the treatment options with the patient. AA drug therapy is indicated. However, I will need to evaluate her chest pain and if her stress test is negative, will start flecainide. She is convinced that the diltiazem is causing vaginal irritation. I have not seen that side effect. We will switch her to verapamil for now until we know she has no CAD. She had tried beta blockers in the past and could not  tolerate them. 2. Obesity - she has gained 30 lbs since moving to [place]. I asked her to eat less. 3. HTN - her pressure is up today. She is encouraged to reduce her salt intake. When I see her back, will consider additional blood pressure lowering. 4. Chest pain - I suspect this is related to her atrial fib and RVR. However, her sister just had a CABG. Will obtain a stress test. I would have a very low threshold to have her undergo cathterization.  Leonia Reeves.D.

## 2015-09-14 ENCOUNTER — Encounter: Payer: Self-pay | Admitting: *Deleted

## 2015-09-14 ENCOUNTER — Other Ambulatory Visit: Payer: Self-pay | Admitting: *Deleted

## 2015-09-14 NOTE — Patient Outreach (Signed)
Transition of care call #3  Pt is doing better in general. She has seen the cardiologist. She was taken off cardizem and started on verapamil. She cannot take betablockers. She was told she was not a candidate for an ablation. She will be having a stress test soon. She is following her HF Action plan. She denies more than a 5# wt. Gain, edema. She does have some exertional SOB. She reports her vulvar skin irritation is somewhat better now that she is off the cardizem. I have advised her I will be calling for the next couple of Mondays and reminded her she can call me if she has any questions or our 24 hour nurse line.  Almetta Lovely [facility] Care Manager [phone]

## 2015-09-15 ENCOUNTER — Encounter (HOSPITAL_COMMUNITY): Payer: Commercial Managed Care - HMO

## 2015-09-15 ENCOUNTER — Encounter (HOSPITAL_COMMUNITY)
Admission: RE | Admit: 2015-09-15 | Discharge: 2015-09-15 | Disposition: A | Discharge status: Home / Self Care | Payer: Commercial Managed Care - HMO | Source: Ambulatory Visit | Attending: Adult Health | Admitting: Adult Health

## 2015-09-15 ENCOUNTER — Encounter (HOSPITAL_COMMUNITY): Admission: RE | Admit: 2015-09-15 | Payer: Commercial Managed Care - HMO | Source: Ambulatory Visit

## 2015-09-15 ENCOUNTER — Other Ambulatory Visit: Payer: Self-pay | Admitting: *Deleted

## 2015-09-15 ENCOUNTER — Inpatient Hospital Stay (HOSPITAL_COMMUNITY): Admission: RE | Admit: 2015-09-15 | Payer: Commercial Managed Care - HMO | Source: Ambulatory Visit

## 2015-09-15 ENCOUNTER — Encounter (HOSPITAL_COMMUNITY): Payer: Self-pay

## 2015-09-15 DIAGNOSIS — I510 Cardiac septal defect, acquired: Secondary | ICD-10-CM | POA: Insufficient documentation

## 2015-09-15 DIAGNOSIS — R072 Precordial pain: Secondary | ICD-10-CM

## 2015-09-15 LAB — NM MYOCAR MULTI W/SPECT W/WALL MOTION / EF
CHL CUP NUCLEAR SRS: 3
CSEPPHR: 75 {beats}/min
LV dias vol: 110 mL (ref 46–106)
LV sys vol: 45 mL
NUC STRESS TID: 1.02
RATE: 0.28
Rest HR: 58 {beats}/min
SDS: 0
SSS: 3

## 2015-09-15 MED ORDER — SODIUM CHLORIDE 0.9% FLUSH
INTRAVENOUS | Status: AC
  Administered 2015-09-15: 10 mL via INTRAVENOUS
  Filled 2015-09-15: qty 10

## 2015-09-15 MED ORDER — REGADENOSON 0.4 MG/5ML IV SOLN
INTRAVENOUS | Status: AC
  Administered 2015-09-15: 0.4 mg via INTRAVENOUS
  Filled 2015-09-15: qty 5

## 2015-09-15 MED ORDER — TECHNETIUM TC 99M TETROFOSMIN IV KIT
10.0000 | PACK | Freq: Once | INTRAVENOUS | Status: AC | PRN
  Administered 2015-09-15: 9.5 via INTRAVENOUS

## 2015-09-15 MED ORDER — TECHNETIUM TC 99M TETROFOSMIN IV KIT
30.0000 | PACK | Freq: Once | INTRAVENOUS | Status: AC | PRN
  Administered 2015-09-15: 28 via INTRAVENOUS

## 2015-09-18 ENCOUNTER — Other Ambulatory Visit: Payer: Self-pay

## 2015-09-18 DIAGNOSIS — R072 Precordial pain: Secondary | ICD-10-CM

## 2015-09-18 MED ORDER — FUROSEMIDE 40 MG PO TABS: 40.0000 mg | ORAL_TABLET | Freq: Two times a day (BID) | ORAL | 1 refills | Status: DC

## 2015-09-18 MED ORDER — APIXABAN 5 MG PO TABS: 5.0000 mg | ORAL_TABLET | Freq: Two times a day (BID) | ORAL | 2 refills | Status: DC

## 2015-09-18 MED ORDER — VERAPAMIL HCL 40 MG PO TABS: 40.0000 mg | ORAL_TABLET | Freq: Three times a day (TID) | ORAL | 2 refills | Status: DC

## 2015-09-18 MED ORDER — POTASSIUM CHLORIDE ER 10 MEQ PO TBCR: 10.0000 meq | EXTENDED_RELEASE_TABLET | Freq: Every day | ORAL | 2 refills | Status: AC

## 2015-09-18 NOTE — Telephone Encounter (Signed)
escribed refills to mail order [facility]  Pt clarified her lasix dose,states she usually takes 2  40 mg tabs daily for additional wt gain states she was told to by MD

## 2015-09-21 ENCOUNTER — Other Ambulatory Visit: Payer: Self-pay | Admitting: *Deleted

## 2015-09-21 NOTE — Patient Outreach (Signed)
Pt is doing well. She has had her stress test and was told that there were no problems evidenced. She will see her current primary care MD on 09/29/15. She reports her wt is stable, no edema, and no more SOB than usual. She is tolerating her new heart medication. She does not report any other problems today.  She has requested that I call her on Wednesday next week instead of Monday. That way she will be able to report on her MD visit.  Almetta Lovely [facility] Care Manager [phone]

## 2015-09-23 NOTE — Telephone Encounter (Signed)
Appt cancelled due to 2 appts scheduled on same day for same provider.

## 2015-09-28 ENCOUNTER — Other Ambulatory Visit: Payer: Self-pay | Admitting: *Deleted

## 2015-09-30 ENCOUNTER — Other Ambulatory Visit: Payer: Self-pay | Admitting: *Deleted

## 2015-09-30 NOTE — Patient Outreach (Signed)
Called pt for last transition of care call, however, she was not at home. Her husband said she went into [place] with her son. I advised I will try to reach her later.  Called and talked to pt. She reports she is doing fair. She did get her referral to see Dr. Juanetta Gosling about her sleep study which she is grateful for. She will see him at the end of this month.  She reports to me her wt is stable and she has no edema. She does have mild SOB most of the time. She walks to the mailbox which is a good stretch she says about an acres length. Sometimes she has to stop and rest on the way back.  She says she is only taking the verapamil 40 mg bid not tid as her heart rate goes down too much, as much as 34 BPM, but mostly in the 40s and 50s.  I will send my note to Dr. Ladona Ridgel and ask if he feels she should continue what she is doing or reduce her dose perhaps.  I will advise her as soon as I get a response, hopefully within a 24 hour period.  Almetta Lovely [facility] Care Manager [phone]

## 2015-10-05 ENCOUNTER — Telehealth: Payer: Self-pay | Admitting: Internal Medicine

## 2015-10-05 ENCOUNTER — Other Ambulatory Visit: Payer: Self-pay | Admitting: *Deleted

## 2015-10-05 NOTE — Patient Outreach (Signed)
I have not heard back from Dr. Ladona Ridgel regarding our question about his pt's medication and possible need to change dose or instructions.  I have called Dr. Lubertha Basque office this am and left a message with Nettie Elm. She is going to talk with Dr. Ladona Ridgel or an associate and get back with me today.  Almetta Lovely [facility] Care Manager [phone]

## 2015-10-05 NOTE — Telephone Encounter (Signed)
Follow Up:     Says she have been trying to contact [name] concerning this pt medication issues.Please call asap.

## 2015-10-05 NOTE — Telephone Encounter (Signed)
Will forward Lindsay Lovely NP note to Dr. Ladona Ridgel.    Lindsay Lovely, NP      09/30/15 10:02 AM  Note    Called pt for last transition of care call, however, she was not at home. Her husband said she went into [place] with her son. I advised I will try to reach her later.  Called and talked to pt. She reports she is doing fair. She did get her referral to see Dr. Juanetta Gosling about her sleep study which she is grateful for. She will see him at the end of this month.  She reports to me her wt is stable and she has no edema. She does have mild SOB most of the time. She walks to the mailbox which is a good stretch she says about an acres length. Sometimes she has to stop and rest on the way back.  She says she is only taking the verapamil 40 mg bid not tid as her heart rate goes down too much, as much as 34 BPM, but mostly in the 40s and 50s.  I will send my note to Dr. Ladona Ridgel and ask if he feels she should continue what she is doing or reduce her dose perhaps.  I will advise her as soon as I get a response, hopefully within a 24 hour period.  Lindsay Whitehead Lindsay Whitehead [facility] Care Manager [phone]

## 2015-10-06 ENCOUNTER — Other Ambulatory Visit: Payer: Self-pay

## 2015-10-06 MED ORDER — APIXABAN 5 MG PO TABS
5.0000 mg | ORAL_TABLET | Freq: Two times a day (BID) | ORAL | 2 refills | Status: DC
Start: 2015-10-06 — End: 2018-04-19

## 2015-10-07 ENCOUNTER — Encounter: Payer: Self-pay | Admitting: *Deleted

## 2015-10-08 ENCOUNTER — Encounter: Payer: Self-pay | Admitting: *Deleted

## 2015-10-08 ENCOUNTER — Other Ambulatory Visit: Payer: Self-pay | Admitting: *Deleted

## 2015-10-08 NOTE — Telephone Encounter (Signed)
This encounter was created in error - please disregard.

## 2015-10-08 NOTE — Patient Outreach (Signed)
  Received inbox message from Dr. Ladona Ridgel 10/07/15 9:30 pm - OK that pt is taking verapamil 40 mg bid only.  Called pt and she was out. I talked to her husband and relayed this message to him to advise his wife.   I reminded him that I am officially closing her case. She will receive an order stating this.  Almetta Lovely [facility] Care Manager [phone]

## 2015-12-14 ENCOUNTER — Encounter: Payer: Self-pay | Admitting: Internal Medicine

## 2015-12-14 ENCOUNTER — Ambulatory Visit (INDEPENDENT_AMBULATORY_CARE_PROVIDER_SITE_OTHER): Payer: Commercial Managed Care - HMO | Admitting: Internal Medicine

## 2015-12-14 VITALS — BP 132/78 | HR 65 | Ht 68.0 in | Wt 209.0 lb

## 2015-12-14 DIAGNOSIS — I4891 Unspecified atrial fibrillation: Secondary | ICD-10-CM | POA: Diagnosis not present

## 2015-12-14 DIAGNOSIS — R072 Precordial pain: Secondary | ICD-10-CM

## 2015-12-14 DIAGNOSIS — I5032 Chronic diastolic (congestive) heart failure: Secondary | ICD-10-CM

## 2015-12-14 MED ORDER — FUROSEMIDE 40 MG PO TABS: 40.0000 mg | ORAL_TABLET | Freq: Every day | ORAL | 3 refills | Status: DC

## 2015-12-14 NOTE — Progress Notes (Signed)
HPI Lindsay Whitehead returns today for evaluation of paroxysmal atrial flutter and fibrillation. The patient is a very pleasant 70 yo woman who I saw in the past with atrial flutter. Since then she has developed mostly coarse atrial fib with a RVR. Her episodes are paraoxysmal and quite debilitating. She does not like to take medications. She also c/o sob and chest pain. Previously she had normal LV function by echo. Her stress test was low risk although she had a defect which was thought to have soft tissue attenuation. Since I saw the patient last, she was switched from cardizem to verapamil. She reports that she has had only one episode of atrial fib.   Her main complaint today is worsening dyspnea with exertion and chest tightness. Carrying the groceries into her house, she c/o sob and chest pressure which resolves with rest.  Allergies  Allergen Reactions  . Bystolic [Nebivolol Hcl] Other (See Comments)    Extreme chest pains/ lowering of blood pressure causing dizziness, falling  . Metoprolol Other (See Comments)    Extreme drop in blood pressure, vertigo resulted  . Asa [Aspirin]     Bleeding gums  . Diltiazem     bleeding gums, stomach ache,weekness   . Edarbi [Azilsartan] Hives  . Lasix [Furosemide] Other (See Comments)    Cramping in thigh area   . Zantac [Ranitidine Hcl]      Current Outpatient Prescriptions  Medication Sig Dispense Refill  . apixaban (ELIQUIS) 5 MG TABS tablet Take 1 tablet (5 mg total) by mouth 2 (two) times daily. 180 tablet 2  . esomeprazole (NEXIUM) 20 MG capsule Take 20 mg by mouth at bedtime. Reported on 09/07/2015    . furosemide (LASIX) 40 MG tablet Take 1 tablet (40 mg total) by mouth 2 (two) times daily. 180 tablet 1  . Multiple Vitamin (MULTIVITAMIN WITH MINERALS) TABS tablet Take 1 tablet by mouth daily.    . potassium chloride (K-DUR) 10 MEQ tablet Take 1 tablet (10 mEq total) by mouth daily. 90 tablet 2  . verapamil (CALAN) 40 MG tablet  Take 40 mg by mouth 2 (two) times daily.      No current facility-administered medications for this visit.      Past Medical History:  Diagnosis Date  . Atrial flutter (HCC)   . Diastolic CHF (HCC)   . Episodic atrial fibrillation (HCC)    Converted to NSR recent hospitalization 01/2013 on Dilt  . Fatigue   . Hypercholesterolemia   . Hypertension   . Obesity   . Right bundle branch block    Appears to be chronic  . Vertigo     ROS:   All systems reviewed and negative except as noted in the HPI.   Past Surgical History:  Procedure Laterality Date  . carpel tunnel surgery    . WRIST FRACTURE SURGERY       Family History  Problem Relation Age of Onset  . Asthma Mother   . Hypertension Mother   . COPD Father   . Cancer Other   . Cardiomyopathy Brother     Congenital with need for heart transplant. Died at 6647.     Social History   Social History  . Marital status: Married    Spouse name: N/A  . Number of children: 3  . Years of education: N/A   Occupational History  . 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 Smoking  status: Never Smoker  . Smokeless tobacco: Never Used  . Alcohol use No     Comment: rarely  . Drug use: No  . Sexual activity: Yes    Birth control/ protection: None   Other Topics Concern  . Not on file   Social History Narrative  . No narrative on file     BP 132/78   Pulse 65   Ht 5\' 8"  (1.727 m)   Wt 209 lb (94.8 kg)   SpO2 95%   BMI 31.78 kg/m   Physical Exam:  Well appearing 70 yo woman, NAD HEENT: Unremarkable Neck:  7 cm JVD, no thyromegally Back:  No CVA tenderness Lungs:  Clear with no wheezes. HEART:  Regular rate rhythm, no murmurs, no rubs, no clicks Abd:  soft, positive bowel sounds, no organomegally, no rebound, no guarding Ext:  2 plus pulses, no edema, no cyanosis, no clubbing Skin:  No rashes no nodules Neuro:  CN II through XII intact, motor grossly  intact  Assess/Plan: 1. PAF - I have discussed the treatment options with the patient. Because she has had only a single episode of atrial fib, I am loathe to commit her to AA drug therapy.  2. Obesity - she has gained 30 lbs since moving to [place]. I asked her to eat less. 3. HTN - her pressure is up minimally today. She is encouraged to reduce her salt intake. When I see her back, will consider additional blood pressure lowering. 4. Chest pain - she has had a stress test which was low risk but did have a defect. Her symptoms are fairly typical and associate with sob. I have asked her to restart her lasix. If not better, I would have a low threshold to have her undergo right and left heart cath. I will ask her to see Dr. Wyline Mood back for ongoing followup and I will be available if she has more symptomatic atrial fib/flutter.  Leonia Reeves.D.

## 2015-12-14 NOTE — Patient Instructions (Signed)
Your physician recommends that you schedule a follow-up appointment in: 4-6 Weeks with Dr. Wyline Mood  Your physician recommends that you schedule a follow-up appointment with Dr. Ladona Ridgel as Needed  Your physician has recommended you make the following change in your medication:  Lasix 40 mg Daily   If you need a refill on your cardiac medications before your next appointment, please call your pharmacy.  Thank you for choosing [facility]!

## 2015-12-25 ENCOUNTER — Ambulatory Visit (HOSPITAL_COMMUNITY)
Admission: RE | Admit: 2015-12-25 | Discharge: 2015-12-25 | Disposition: A | Discharge status: Home / Self Care | Payer: Commercial Managed Care - HMO | Source: Ambulatory Visit | Attending: Internal Medicine | Admitting: Internal Medicine

## 2015-12-25 ENCOUNTER — Other Ambulatory Visit (HOSPITAL_COMMUNITY): Payer: Self-pay | Admitting: Internal Medicine

## 2015-12-25 DIAGNOSIS — R0600 Dyspnea, unspecified: Secondary | ICD-10-CM

## 2015-12-28 IMAGING — NM NM MYOCAR SINGLE W/SPECT W/WALL MOTION & EF
2 series · 12 of 12 positions shown · non-contrast
Comparison: None.

CLINICAL DATA: 67-year-old woman with history of atrial flutter,
LVEF 50-55%, referred now with dyspnea on exertion and chest pain
for the assessment of ischemia.

EXAM:
MYOCARDIAL IMAGING WITH SPECT (REST AND PHARMACOLOGIC-STRESS)
GATED LEFT VENTRICULAR WALL MOTION STUDY
LEFT VENTRICULAR EJECTION FRACTION
TECHNIQUE: Standard myocardial SPECT imaging was performed after resting
intravenous injection of 10 mCi 7c-ZZm sestamibi. Subsequently,
intravenous infusion of Lexiscan was performed under the supervision
of the Cardiology staff. At peak effect of the drug, 30 mCi 7c-ZZm
sestamibi was injected intravenously and standard myocardial SPECT
imaging was performed. Quantitative gated imaging was also performed
to evaluate left ventricular wall motion, and estimate left
ventricular ejection fraction.

[cr cardiac tc low dose · 6.41mm/px · 6 of 64 frames shown]
[frame 6/64]
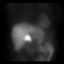
[frame 16/64]
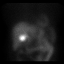
[frame 27/64]
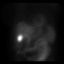
[frame 38/64]
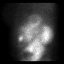
[frame 48/64]
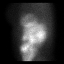
[frame 59/64]
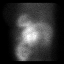

[cs cardiac tc hi dose · 6.41mm/px · 6 of 512 frames shown]
[frame 43/512]
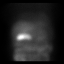
[frame 128/512]
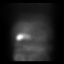
[frame 214/512]
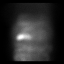
[frame 299/512]
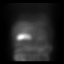
[frame 384/512]
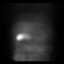
[frame 470/512]
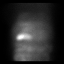

[12 of 12 positions shown; findings below may reference images not displayed]

FINDINGS: Baseline tracing shows sinus bradycardia at 56% with diffuse
nonspecific ST-T changes and incomplete right bundle branch block.
Lexiscan bolus was given in standard fashion. Heart rate increased
from 56 beats per min up to 79 beats per min, and blood pressure
increased from 158/93 up to 194/86. Patient tolerated infusion well
without chest pain. No diagnostic ST segment abnormalities were
noted over baseline changes, and there were no arrhythmias.

Analysis of the overall perfusion data finds breast attenuation
artifact.

Tomographic views were obtained using the short axis, vertical long
axis, and horizontal long axis planes. There is a mild intensity,
mild to moderate-sized, anterior fixed perfusion defect that is most
consistent with attenuation in light of normal wall motion in this
region on gated imaging. No clearly reversible defects noted to
indicate ischemia.

Gated imaging reveals an EDV of 143, ESV of 69, TID ratio of 1.03,
and LVEF of 52% without wall motion abnormalities.
IMPRESSION: Low risk Lexiscan Cardiolite as outlined. There were no diagnostic
ST segment abnormalities over nonspecific changes at baseline. Sinus
rhythm was noted throughout the study. Perfusion imaging is most
consistent with soft tissue attenuation affecting the anterior wall,
no definite scar or ischemia. LVEF 52% with increased LV volumes.

## 2016-01-25 ENCOUNTER — Ambulatory Visit: Payer: Commercial Managed Care - HMO | Admitting: Cardiology

## 2016-03-04 DIAGNOSIS — G4733 Obstructive sleep apnea (adult) (pediatric): Secondary | ICD-10-CM | POA: Diagnosis not present

## 2016-03-08 DIAGNOSIS — J449 Chronic obstructive pulmonary disease, unspecified: Secondary | ICD-10-CM | POA: Diagnosis not present

## 2016-03-08 DIAGNOSIS — I4891 Unspecified atrial fibrillation: Secondary | ICD-10-CM | POA: Diagnosis not present

## 2016-03-08 DIAGNOSIS — G4733 Obstructive sleep apnea (adult) (pediatric): Secondary | ICD-10-CM | POA: Diagnosis not present

## 2016-03-08 DIAGNOSIS — I10 Essential (primary) hypertension: Secondary | ICD-10-CM | POA: Diagnosis not present

## 2016-04-04 DIAGNOSIS — G4733 Obstructive sleep apnea (adult) (pediatric): Secondary | ICD-10-CM | POA: Diagnosis not present

## 2016-04-22 DIAGNOSIS — R69 Illness, unspecified: Secondary | ICD-10-CM | POA: Diagnosis not present

## 2016-05-02 DIAGNOSIS — G4733 Obstructive sleep apnea (adult) (pediatric): Secondary | ICD-10-CM | POA: Diagnosis not present

## 2016-05-20 DIAGNOSIS — G4733 Obstructive sleep apnea (adult) (pediatric): Secondary | ICD-10-CM | POA: Diagnosis not present

## 2016-06-02 DIAGNOSIS — G4733 Obstructive sleep apnea (adult) (pediatric): Secondary | ICD-10-CM | POA: Diagnosis not present

## 2016-06-13 DIAGNOSIS — G4733 Obstructive sleep apnea (adult) (pediatric): Secondary | ICD-10-CM | POA: Diagnosis not present

## 2016-07-02 DIAGNOSIS — G4733 Obstructive sleep apnea (adult) (pediatric): Secondary | ICD-10-CM | POA: Diagnosis not present

## 2016-07-06 DIAGNOSIS — G4733 Obstructive sleep apnea (adult) (pediatric): Secondary | ICD-10-CM | POA: Diagnosis not present

## 2016-07-06 DIAGNOSIS — I509 Heart failure, unspecified: Secondary | ICD-10-CM | POA: Diagnosis not present

## 2016-07-06 DIAGNOSIS — I10 Essential (primary) hypertension: Secondary | ICD-10-CM | POA: Diagnosis not present

## 2016-07-06 DIAGNOSIS — E669 Obesity, unspecified: Secondary | ICD-10-CM | POA: Diagnosis not present

## 2016-07-26 DIAGNOSIS — I509 Heart failure, unspecified: Secondary | ICD-10-CM | POA: Diagnosis not present

## 2016-07-26 DIAGNOSIS — I5030 Unspecified diastolic (congestive) heart failure: Secondary | ICD-10-CM | POA: Diagnosis not present

## 2016-07-26 DIAGNOSIS — I480 Paroxysmal atrial fibrillation: Secondary | ICD-10-CM | POA: Diagnosis not present

## 2016-07-26 DIAGNOSIS — H9113 Presbycusis, bilateral: Secondary | ICD-10-CM | POA: Diagnosis not present

## 2016-07-26 DIAGNOSIS — E669 Obesity, unspecified: Secondary | ICD-10-CM | POA: Diagnosis not present

## 2016-07-26 DIAGNOSIS — Z7901 Long term (current) use of anticoagulants: Secondary | ICD-10-CM | POA: Diagnosis not present

## 2016-07-26 DIAGNOSIS — I4891 Unspecified atrial fibrillation: Secondary | ICD-10-CM | POA: Diagnosis not present

## 2016-07-26 DIAGNOSIS — J449 Chronic obstructive pulmonary disease, unspecified: Secondary | ICD-10-CM | POA: Diagnosis not present

## 2016-07-26 DIAGNOSIS — Z0000 Encounter for general adult medical examination without abnormal findings: Secondary | ICD-10-CM | POA: Diagnosis not present

## 2016-07-26 DIAGNOSIS — I10 Essential (primary) hypertension: Secondary | ICD-10-CM | POA: Diagnosis not present

## 2016-07-26 DIAGNOSIS — G4730 Sleep apnea, unspecified: Secondary | ICD-10-CM | POA: Diagnosis not present

## 2016-07-26 DIAGNOSIS — Z6832 Body mass index (BMI) 32.0-32.9, adult: Secondary | ICD-10-CM | POA: Diagnosis not present

## 2016-07-26 DIAGNOSIS — H9313 Tinnitus, bilateral: Secondary | ICD-10-CM | POA: Diagnosis not present

## 2016-07-26 DIAGNOSIS — J45909 Unspecified asthma, uncomplicated: Secondary | ICD-10-CM | POA: Diagnosis not present

## 2016-08-02 DIAGNOSIS — G4733 Obstructive sleep apnea (adult) (pediatric): Secondary | ICD-10-CM | POA: Diagnosis not present

## 2016-08-15 DIAGNOSIS — I509 Heart failure, unspecified: Secondary | ICD-10-CM | POA: Diagnosis not present

## 2016-08-15 DIAGNOSIS — E669 Obesity, unspecified: Secondary | ICD-10-CM | POA: Diagnosis not present

## 2016-08-15 DIAGNOSIS — I4891 Unspecified atrial fibrillation: Secondary | ICD-10-CM | POA: Diagnosis not present

## 2016-08-15 DIAGNOSIS — I10 Essential (primary) hypertension: Secondary | ICD-10-CM | POA: Diagnosis not present

## 2016-08-15 DIAGNOSIS — G4733 Obstructive sleep apnea (adult) (pediatric): Secondary | ICD-10-CM | POA: Diagnosis not present

## 2016-08-15 DIAGNOSIS — J449 Chronic obstructive pulmonary disease, unspecified: Secondary | ICD-10-CM | POA: Diagnosis not present

## 2016-08-15 DIAGNOSIS — W57XXXA Bitten or stung by nonvenomous insect and other nonvenomous arthropods, initial encounter: Secondary | ICD-10-CM | POA: Diagnosis not present

## 2016-08-15 DIAGNOSIS — E559 Vitamin D deficiency, unspecified: Secondary | ICD-10-CM | POA: Diagnosis not present

## 2016-09-01 DIAGNOSIS — G4733 Obstructive sleep apnea (adult) (pediatric): Secondary | ICD-10-CM | POA: Diagnosis not present

## 2016-09-08 DIAGNOSIS — I509 Heart failure, unspecified: Secondary | ICD-10-CM | POA: Diagnosis not present

## 2016-09-08 DIAGNOSIS — J449 Chronic obstructive pulmonary disease, unspecified: Secondary | ICD-10-CM | POA: Diagnosis not present

## 2016-09-08 DIAGNOSIS — G4733 Obstructive sleep apnea (adult) (pediatric): Secondary | ICD-10-CM | POA: Diagnosis not present

## 2016-09-08 DIAGNOSIS — I10 Essential (primary) hypertension: Secondary | ICD-10-CM | POA: Diagnosis not present

## 2016-09-09 ENCOUNTER — Other Ambulatory Visit (HOSPITAL_COMMUNITY): Payer: Self-pay | Admitting: Pulmonary Disease

## 2016-09-09 DIAGNOSIS — K3184 Gastroparesis: Secondary | ICD-10-CM

## 2016-09-10 DIAGNOSIS — I10 Essential (primary) hypertension: Secondary | ICD-10-CM | POA: Diagnosis not present

## 2016-09-10 DIAGNOSIS — I509 Heart failure, unspecified: Secondary | ICD-10-CM | POA: Diagnosis not present

## 2016-09-10 DIAGNOSIS — I4891 Unspecified atrial fibrillation: Secondary | ICD-10-CM | POA: Diagnosis not present

## 2016-09-10 DIAGNOSIS — J449 Chronic obstructive pulmonary disease, unspecified: Secondary | ICD-10-CM | POA: Diagnosis not present

## 2016-09-10 DIAGNOSIS — G4733 Obstructive sleep apnea (adult) (pediatric): Secondary | ICD-10-CM | POA: Diagnosis not present

## 2016-09-14 ENCOUNTER — Encounter (HOSPITAL_COMMUNITY)
Admission: RE | Admit: 2016-09-14 | Discharge: 2016-09-14 | Disposition: A | Discharge status: Home / Self Care | Payer: Medicare HMO | Source: Ambulatory Visit | Attending: Pulmonary Disease | Admitting: Pulmonary Disease

## 2016-09-14 ENCOUNTER — Encounter (HOSPITAL_COMMUNITY): Payer: Self-pay

## 2016-09-14 DIAGNOSIS — G4733 Obstructive sleep apnea (adult) (pediatric): Secondary | ICD-10-CM | POA: Diagnosis not present

## 2016-09-14 DIAGNOSIS — R140 Abdominal distension (gaseous): Secondary | ICD-10-CM | POA: Diagnosis not present

## 2016-09-14 DIAGNOSIS — K3184 Gastroparesis: Secondary | ICD-10-CM | POA: Diagnosis present

## 2016-09-14 MED ORDER — TECHNETIUM TC 99M SULFUR COLLOID
2.0000 | Freq: Once | INTRAVENOUS | Status: AC
  Administered 2016-09-14: 2 via INTRAVENOUS

## 2016-10-02 DIAGNOSIS — G4733 Obstructive sleep apnea (adult) (pediatric): Secondary | ICD-10-CM | POA: Diagnosis not present

## 2016-10-04 DIAGNOSIS — R69 Illness, unspecified: Secondary | ICD-10-CM | POA: Diagnosis not present

## 2016-10-06 DIAGNOSIS — Z0000 Encounter for general adult medical examination without abnormal findings: Secondary | ICD-10-CM | POA: Diagnosis not present

## 2016-10-07 ENCOUNTER — Other Ambulatory Visit (HOSPITAL_COMMUNITY): Payer: Self-pay | Admitting: Pulmonary Disease

## 2016-10-07 DIAGNOSIS — Z780 Asymptomatic menopausal state: Secondary | ICD-10-CM

## 2016-10-13 ENCOUNTER — Ambulatory Visit (HOSPITAL_COMMUNITY)
Admission: RE | Admit: 2016-10-13 | Discharge: 2016-10-13 | Disposition: A | Discharge status: Home / Self Care | Payer: Medicare HMO | Source: Ambulatory Visit | Attending: Pulmonary Disease | Admitting: Pulmonary Disease

## 2016-10-13 DIAGNOSIS — Z780 Asymptomatic menopausal state: Secondary | ICD-10-CM | POA: Diagnosis not present

## 2016-10-13 DIAGNOSIS — Z13820 Encounter for screening for osteoporosis: Secondary | ICD-10-CM | POA: Diagnosis not present

## 2016-10-25 DIAGNOSIS — I4891 Unspecified atrial fibrillation: Secondary | ICD-10-CM | POA: Diagnosis not present

## 2016-10-25 DIAGNOSIS — J449 Chronic obstructive pulmonary disease, unspecified: Secondary | ICD-10-CM | POA: Diagnosis not present

## 2016-10-25 DIAGNOSIS — I10 Essential (primary) hypertension: Secondary | ICD-10-CM | POA: Diagnosis not present

## 2016-10-28 DIAGNOSIS — G4733 Obstructive sleep apnea (adult) (pediatric): Secondary | ICD-10-CM | POA: Diagnosis not present

## 2016-10-28 DIAGNOSIS — I480 Paroxysmal atrial fibrillation: Secondary | ICD-10-CM | POA: Diagnosis not present

## 2016-10-28 DIAGNOSIS — I10 Essential (primary) hypertension: Secondary | ICD-10-CM | POA: Diagnosis not present

## 2016-10-28 DIAGNOSIS — I5032 Chronic diastolic (congestive) heart failure: Secondary | ICD-10-CM | POA: Diagnosis not present

## 2016-11-02 DIAGNOSIS — G4733 Obstructive sleep apnea (adult) (pediatric): Secondary | ICD-10-CM | POA: Diagnosis not present

## 2016-11-14 DIAGNOSIS — I10 Essential (primary) hypertension: Secondary | ICD-10-CM | POA: Diagnosis not present

## 2016-11-29 DIAGNOSIS — I10 Essential (primary) hypertension: Secondary | ICD-10-CM | POA: Diagnosis not present

## 2016-11-29 DIAGNOSIS — I480 Paroxysmal atrial fibrillation: Secondary | ICD-10-CM | POA: Diagnosis not present

## 2016-12-02 DIAGNOSIS — G4733 Obstructive sleep apnea (adult) (pediatric): Secondary | ICD-10-CM | POA: Diagnosis not present

## 2016-12-12 DIAGNOSIS — I10 Essential (primary) hypertension: Secondary | ICD-10-CM | POA: Diagnosis not present

## 2016-12-12 DIAGNOSIS — I480 Paroxysmal atrial fibrillation: Secondary | ICD-10-CM | POA: Diagnosis not present

## 2016-12-12 DIAGNOSIS — I5032 Chronic diastolic (congestive) heart failure: Secondary | ICD-10-CM | POA: Diagnosis not present

## 2016-12-12 DIAGNOSIS — G4733 Obstructive sleep apnea (adult) (pediatric): Secondary | ICD-10-CM | POA: Diagnosis not present

## 2016-12-16 DIAGNOSIS — G4733 Obstructive sleep apnea (adult) (pediatric): Secondary | ICD-10-CM | POA: Diagnosis not present

## 2016-12-29 DIAGNOSIS — G4733 Obstructive sleep apnea (adult) (pediatric): Secondary | ICD-10-CM | POA: Diagnosis not present

## 2017-01-02 DIAGNOSIS — G4733 Obstructive sleep apnea (adult) (pediatric): Secondary | ICD-10-CM | POA: Diagnosis not present

## 2017-01-05 DIAGNOSIS — I4891 Unspecified atrial fibrillation: Secondary | ICD-10-CM | POA: Diagnosis not present

## 2017-01-05 DIAGNOSIS — I10 Essential (primary) hypertension: Secondary | ICD-10-CM | POA: Diagnosis not present

## 2017-01-05 DIAGNOSIS — J449 Chronic obstructive pulmonary disease, unspecified: Secondary | ICD-10-CM | POA: Diagnosis not present

## 2017-01-05 DIAGNOSIS — I509 Heart failure, unspecified: Secondary | ICD-10-CM | POA: Diagnosis not present

## 2017-01-17 DIAGNOSIS — G4733 Obstructive sleep apnea (adult) (pediatric): Secondary | ICD-10-CM | POA: Diagnosis not present

## 2017-01-19 DIAGNOSIS — I4891 Unspecified atrial fibrillation: Secondary | ICD-10-CM | POA: Diagnosis not present

## 2017-01-19 DIAGNOSIS — J449 Chronic obstructive pulmonary disease, unspecified: Secondary | ICD-10-CM | POA: Diagnosis not present

## 2017-01-19 DIAGNOSIS — I509 Heart failure, unspecified: Secondary | ICD-10-CM | POA: Diagnosis not present

## 2017-01-19 DIAGNOSIS — I10 Essential (primary) hypertension: Secondary | ICD-10-CM | POA: Diagnosis not present

## 2017-01-25 DIAGNOSIS — Z961 Presence of intraocular lens: Secondary | ICD-10-CM | POA: Diagnosis not present

## 2017-01-25 DIAGNOSIS — Z0101 Encounter for examination of eyes and vision with abnormal findings: Secondary | ICD-10-CM | POA: Diagnosis not present

## 2017-02-20 DIAGNOSIS — I4891 Unspecified atrial fibrillation: Secondary | ICD-10-CM | POA: Diagnosis not present

## 2017-02-20 DIAGNOSIS — J449 Chronic obstructive pulmonary disease, unspecified: Secondary | ICD-10-CM | POA: Diagnosis not present

## 2017-02-20 DIAGNOSIS — I10 Essential (primary) hypertension: Secondary | ICD-10-CM | POA: Diagnosis not present

## 2017-02-20 DIAGNOSIS — G4733 Obstructive sleep apnea (adult) (pediatric): Secondary | ICD-10-CM | POA: Diagnosis not present

## 2017-02-23 DIAGNOSIS — J449 Chronic obstructive pulmonary disease, unspecified: Secondary | ICD-10-CM | POA: Diagnosis not present

## 2017-02-23 DIAGNOSIS — G4733 Obstructive sleep apnea (adult) (pediatric): Secondary | ICD-10-CM | POA: Diagnosis not present

## 2017-02-23 DIAGNOSIS — I4891 Unspecified atrial fibrillation: Secondary | ICD-10-CM | POA: Diagnosis not present

## 2017-02-23 DIAGNOSIS — I10 Essential (primary) hypertension: Secondary | ICD-10-CM | POA: Diagnosis not present

## 2017-04-14 DIAGNOSIS — G4733 Obstructive sleep apnea (adult) (pediatric): Secondary | ICD-10-CM | POA: Diagnosis not present

## 2017-04-24 DIAGNOSIS — R079 Chest pain, unspecified: Secondary | ICD-10-CM | POA: Diagnosis not present

## 2017-04-24 DIAGNOSIS — R0602 Shortness of breath: Secondary | ICD-10-CM | POA: Diagnosis not present

## 2017-04-24 DIAGNOSIS — I10 Essential (primary) hypertension: Secondary | ICD-10-CM | POA: Diagnosis not present

## 2017-04-24 DIAGNOSIS — E782 Mixed hyperlipidemia: Secondary | ICD-10-CM | POA: Diagnosis not present

## 2017-05-03 DIAGNOSIS — R0602 Shortness of breath: Secondary | ICD-10-CM | POA: Diagnosis not present

## 2017-05-03 DIAGNOSIS — Z6831 Body mass index (BMI) 31.0-31.9, adult: Secondary | ICD-10-CM | POA: Diagnosis not present

## 2017-05-03 DIAGNOSIS — R062 Wheezing: Secondary | ICD-10-CM | POA: Diagnosis not present

## 2017-05-09 DIAGNOSIS — E782 Mixed hyperlipidemia: Secondary | ICD-10-CM | POA: Diagnosis not present

## 2017-05-09 DIAGNOSIS — I10 Essential (primary) hypertension: Secondary | ICD-10-CM | POA: Diagnosis not present

## 2017-05-09 DIAGNOSIS — R0602 Shortness of breath: Secondary | ICD-10-CM | POA: Diagnosis not present

## 2017-05-09 DIAGNOSIS — R079 Chest pain, unspecified: Secondary | ICD-10-CM | POA: Diagnosis not present

## 2017-05-12 DIAGNOSIS — E782 Mixed hyperlipidemia: Secondary | ICD-10-CM | POA: Diagnosis not present

## 2017-05-12 DIAGNOSIS — R0602 Shortness of breath: Secondary | ICD-10-CM | POA: Diagnosis not present

## 2017-05-12 DIAGNOSIS — Z6831 Body mass index (BMI) 31.0-31.9, adult: Secondary | ICD-10-CM | POA: Diagnosis not present

## 2017-05-12 DIAGNOSIS — Z0000 Encounter for general adult medical examination without abnormal findings: Secondary | ICD-10-CM | POA: Diagnosis not present

## 2017-05-12 DIAGNOSIS — I5022 Chronic systolic (congestive) heart failure: Secondary | ICD-10-CM | POA: Diagnosis not present

## 2017-05-12 DIAGNOSIS — M109 Gout, unspecified: Secondary | ICD-10-CM | POA: Diagnosis not present

## 2017-05-12 DIAGNOSIS — I4891 Unspecified atrial fibrillation: Secondary | ICD-10-CM | POA: Diagnosis not present

## 2017-05-12 DIAGNOSIS — I10 Essential (primary) hypertension: Secondary | ICD-10-CM | POA: Diagnosis not present

## 2017-05-30 DIAGNOSIS — Z6832 Body mass index (BMI) 32.0-32.9, adult: Secondary | ICD-10-CM | POA: Diagnosis not present

## 2017-05-30 DIAGNOSIS — R0602 Shortness of breath: Secondary | ICD-10-CM | POA: Diagnosis not present

## 2017-05-30 DIAGNOSIS — I10 Essential (primary) hypertension: Secondary | ICD-10-CM | POA: Diagnosis not present

## 2017-05-30 DIAGNOSIS — I4891 Unspecified atrial fibrillation: Secondary | ICD-10-CM | POA: Diagnosis not present

## 2017-05-30 DIAGNOSIS — I5022 Chronic systolic (congestive) heart failure: Secondary | ICD-10-CM | POA: Diagnosis not present

## 2017-06-21 DIAGNOSIS — G4733 Obstructive sleep apnea (adult) (pediatric): Secondary | ICD-10-CM | POA: Diagnosis not present

## 2017-06-21 DIAGNOSIS — I517 Cardiomegaly: Secondary | ICD-10-CM | POA: Diagnosis not present

## 2017-06-21 DIAGNOSIS — R918 Other nonspecific abnormal finding of lung field: Secondary | ICD-10-CM | POA: Diagnosis not present

## 2017-06-21 DIAGNOSIS — I43 Cardiomyopathy in diseases classified elsewhere: Secondary | ICD-10-CM | POA: Diagnosis not present

## 2017-06-21 DIAGNOSIS — I10 Essential (primary) hypertension: Secondary | ICD-10-CM | POA: Diagnosis not present

## 2017-06-21 DIAGNOSIS — I5032 Chronic diastolic (congestive) heart failure: Secondary | ICD-10-CM | POA: Diagnosis not present

## 2017-06-21 DIAGNOSIS — I480 Paroxysmal atrial fibrillation: Secondary | ICD-10-CM | POA: Diagnosis not present

## 2017-06-21 DIAGNOSIS — Z9989 Dependence on other enabling machines and devices: Secondary | ICD-10-CM | POA: Diagnosis not present

## 2017-06-21 DIAGNOSIS — M47814 Spondylosis without myelopathy or radiculopathy, thoracic region: Secondary | ICD-10-CM | POA: Diagnosis not present

## 2017-06-21 DIAGNOSIS — I5021 Acute systolic (congestive) heart failure: Secondary | ICD-10-CM | POA: Diagnosis not present

## 2017-06-21 DIAGNOSIS — R000 Tachycardia, unspecified: Secondary | ICD-10-CM | POA: Diagnosis not present

## 2017-06-21 DIAGNOSIS — R0602 Shortness of breath: Secondary | ICD-10-CM | POA: Diagnosis not present

## 2017-06-27 DIAGNOSIS — R0602 Shortness of breath: Secondary | ICD-10-CM | POA: Diagnosis not present

## 2017-06-27 DIAGNOSIS — I5022 Chronic systolic (congestive) heart failure: Secondary | ICD-10-CM | POA: Diagnosis not present

## 2017-06-27 DIAGNOSIS — I5023 Acute on chronic systolic (congestive) heart failure: Secondary | ICD-10-CM | POA: Diagnosis not present

## 2017-06-27 DIAGNOSIS — I361 Nonrheumatic tricuspid (valve) insufficiency: Secondary | ICD-10-CM | POA: Diagnosis not present

## 2017-06-27 DIAGNOSIS — J449 Chronic obstructive pulmonary disease, unspecified: Secondary | ICD-10-CM | POA: Diagnosis not present

## 2017-06-27 DIAGNOSIS — I481 Persistent atrial fibrillation: Secondary | ICD-10-CM | POA: Diagnosis not present

## 2017-06-27 DIAGNOSIS — I480 Paroxysmal atrial fibrillation: Secondary | ICD-10-CM | POA: Diagnosis not present

## 2017-06-27 DIAGNOSIS — I43 Cardiomyopathy in diseases classified elsewhere: Secondary | ICD-10-CM | POA: Diagnosis not present

## 2017-06-27 DIAGNOSIS — I110 Hypertensive heart disease with heart failure: Secondary | ICD-10-CM | POA: Diagnosis not present

## 2017-06-27 DIAGNOSIS — R918 Other nonspecific abnormal finding of lung field: Secondary | ICD-10-CM | POA: Diagnosis not present

## 2017-06-27 DIAGNOSIS — Z9989 Dependence on other enabling machines and devices: Secondary | ICD-10-CM | POA: Diagnosis not present

## 2017-06-27 DIAGNOSIS — I5021 Acute systolic (congestive) heart failure: Secondary | ICD-10-CM | POA: Diagnosis not present

## 2017-06-27 DIAGNOSIS — I340 Nonrheumatic mitral (valve) insufficiency: Secondary | ICD-10-CM | POA: Diagnosis not present

## 2017-06-27 DIAGNOSIS — N179 Acute kidney failure, unspecified: Secondary | ICD-10-CM | POA: Diagnosis not present

## 2017-06-27 DIAGNOSIS — Z7901 Long term (current) use of anticoagulants: Secondary | ICD-10-CM | POA: Diagnosis not present

## 2017-06-27 DIAGNOSIS — K219 Gastro-esophageal reflux disease without esophagitis: Secondary | ICD-10-CM | POA: Diagnosis not present

## 2017-06-27 DIAGNOSIS — I4891 Unspecified atrial fibrillation: Secondary | ICD-10-CM | POA: Diagnosis not present

## 2017-06-27 DIAGNOSIS — G4733 Obstructive sleep apnea (adult) (pediatric): Secondary | ICD-10-CM | POA: Diagnosis not present

## 2017-06-27 DIAGNOSIS — I272 Pulmonary hypertension, unspecified: Secondary | ICD-10-CM | POA: Diagnosis not present

## 2017-06-28 DIAGNOSIS — I480 Paroxysmal atrial fibrillation: Secondary | ICD-10-CM | POA: Diagnosis not present

## 2017-07-06 DIAGNOSIS — I481 Persistent atrial fibrillation: Secondary | ICD-10-CM | POA: Diagnosis not present

## 2017-07-06 DIAGNOSIS — I429 Cardiomyopathy, unspecified: Secondary | ICD-10-CM | POA: Diagnosis not present

## 2017-07-06 DIAGNOSIS — N179 Acute kidney failure, unspecified: Secondary | ICD-10-CM | POA: Diagnosis not present

## 2017-07-06 DIAGNOSIS — R079 Chest pain, unspecified: Secondary | ICD-10-CM | POA: Diagnosis not present

## 2017-07-06 DIAGNOSIS — Z0000 Encounter for general adult medical examination without abnormal findings: Secondary | ICD-10-CM | POA: Diagnosis not present

## 2017-07-06 DIAGNOSIS — K219 Gastro-esophageal reflux disease without esophagitis: Secondary | ICD-10-CM | POA: Diagnosis not present

## 2017-07-06 DIAGNOSIS — I10 Essential (primary) hypertension: Secondary | ICD-10-CM | POA: Diagnosis not present

## 2017-07-06 DIAGNOSIS — E782 Mixed hyperlipidemia: Secondary | ICD-10-CM | POA: Diagnosis not present

## 2017-07-06 DIAGNOSIS — I4891 Unspecified atrial fibrillation: Secondary | ICD-10-CM | POA: Diagnosis not present

## 2017-07-06 DIAGNOSIS — R0602 Shortness of breath: Secondary | ICD-10-CM | POA: Diagnosis not present

## 2017-07-06 DIAGNOSIS — M109 Gout, unspecified: Secondary | ICD-10-CM | POA: Diagnosis not present

## 2017-07-06 DIAGNOSIS — Z7901 Long term (current) use of anticoagulants: Secondary | ICD-10-CM | POA: Diagnosis not present

## 2017-07-06 DIAGNOSIS — I081 Rheumatic disorders of both mitral and tricuspid valves: Secondary | ICD-10-CM | POA: Diagnosis not present

## 2017-07-06 DIAGNOSIS — I270 Primary pulmonary hypertension: Secondary | ICD-10-CM | POA: Diagnosis not present

## 2017-07-06 DIAGNOSIS — I4892 Unspecified atrial flutter: Secondary | ICD-10-CM | POA: Diagnosis not present

## 2017-07-06 DIAGNOSIS — G4733 Obstructive sleep apnea (adult) (pediatric): Secondary | ICD-10-CM | POA: Diagnosis not present

## 2017-07-06 DIAGNOSIS — J449 Chronic obstructive pulmonary disease, unspecified: Secondary | ICD-10-CM | POA: Diagnosis not present

## 2017-07-06 DIAGNOSIS — I110 Hypertensive heart disease with heart failure: Secondary | ICD-10-CM | POA: Diagnosis not present

## 2017-07-06 DIAGNOSIS — I5022 Chronic systolic (congestive) heart failure: Secondary | ICD-10-CM | POA: Diagnosis not present

## 2017-07-10 DIAGNOSIS — N179 Acute kidney failure, unspecified: Secondary | ICD-10-CM | POA: Diagnosis not present

## 2017-07-10 DIAGNOSIS — I10 Essential (primary) hypertension: Secondary | ICD-10-CM | POA: Diagnosis not present

## 2017-07-10 DIAGNOSIS — Z0000 Encounter for general adult medical examination without abnormal findings: Secondary | ICD-10-CM | POA: Diagnosis not present

## 2017-07-10 DIAGNOSIS — I4892 Unspecified atrial flutter: Secondary | ICD-10-CM | POA: Diagnosis not present

## 2017-07-10 DIAGNOSIS — E782 Mixed hyperlipidemia: Secondary | ICD-10-CM | POA: Diagnosis not present

## 2017-07-10 DIAGNOSIS — R0602 Shortness of breath: Secondary | ICD-10-CM | POA: Diagnosis not present

## 2017-07-10 DIAGNOSIS — I5022 Chronic systolic (congestive) heart failure: Secondary | ICD-10-CM | POA: Diagnosis not present

## 2017-07-10 DIAGNOSIS — I4891 Unspecified atrial fibrillation: Secondary | ICD-10-CM | POA: Diagnosis not present

## 2017-07-10 DIAGNOSIS — Z7901 Long term (current) use of anticoagulants: Secondary | ICD-10-CM | POA: Diagnosis not present

## 2017-07-10 DIAGNOSIS — Z6829 Body mass index (BMI) 29.0-29.9, adult: Secondary | ICD-10-CM | POA: Diagnosis not present

## 2017-07-11 DIAGNOSIS — Z7901 Long term (current) use of anticoagulants: Secondary | ICD-10-CM | POA: Diagnosis not present

## 2017-07-11 DIAGNOSIS — J449 Chronic obstructive pulmonary disease, unspecified: Secondary | ICD-10-CM | POA: Diagnosis not present

## 2017-07-11 DIAGNOSIS — I429 Cardiomyopathy, unspecified: Secondary | ICD-10-CM | POA: Diagnosis not present

## 2017-07-11 DIAGNOSIS — I081 Rheumatic disorders of both mitral and tricuspid valves: Secondary | ICD-10-CM | POA: Diagnosis not present

## 2017-07-11 DIAGNOSIS — I481 Persistent atrial fibrillation: Secondary | ICD-10-CM | POA: Diagnosis not present

## 2017-07-11 DIAGNOSIS — G4733 Obstructive sleep apnea (adult) (pediatric): Secondary | ICD-10-CM | POA: Diagnosis not present

## 2017-07-11 DIAGNOSIS — K219 Gastro-esophageal reflux disease without esophagitis: Secondary | ICD-10-CM | POA: Diagnosis not present

## 2017-07-11 DIAGNOSIS — I5022 Chronic systolic (congestive) heart failure: Secondary | ICD-10-CM | POA: Diagnosis not present

## 2017-07-11 DIAGNOSIS — I270 Primary pulmonary hypertension: Secondary | ICD-10-CM | POA: Diagnosis not present

## 2017-07-11 DIAGNOSIS — I110 Hypertensive heart disease with heart failure: Secondary | ICD-10-CM | POA: Diagnosis not present

## 2017-07-12 DIAGNOSIS — I5022 Chronic systolic (congestive) heart failure: Secondary | ICD-10-CM | POA: Diagnosis not present

## 2017-07-12 DIAGNOSIS — K219 Gastro-esophageal reflux disease without esophagitis: Secondary | ICD-10-CM | POA: Diagnosis not present

## 2017-07-12 DIAGNOSIS — I481 Persistent atrial fibrillation: Secondary | ICD-10-CM | POA: Diagnosis not present

## 2017-07-12 DIAGNOSIS — I270 Primary pulmonary hypertension: Secondary | ICD-10-CM | POA: Diagnosis not present

## 2017-07-12 DIAGNOSIS — I110 Hypertensive heart disease with heart failure: Secondary | ICD-10-CM | POA: Diagnosis not present

## 2017-07-12 DIAGNOSIS — I081 Rheumatic disorders of both mitral and tricuspid valves: Secondary | ICD-10-CM | POA: Diagnosis not present

## 2017-07-12 DIAGNOSIS — J449 Chronic obstructive pulmonary disease, unspecified: Secondary | ICD-10-CM | POA: Diagnosis not present

## 2017-07-12 DIAGNOSIS — I429 Cardiomyopathy, unspecified: Secondary | ICD-10-CM | POA: Diagnosis not present

## 2017-07-12 DIAGNOSIS — G4733 Obstructive sleep apnea (adult) (pediatric): Secondary | ICD-10-CM | POA: Diagnosis not present

## 2017-07-12 DIAGNOSIS — Z7901 Long term (current) use of anticoagulants: Secondary | ICD-10-CM | POA: Diagnosis not present

## 2017-07-14 DIAGNOSIS — Z7901 Long term (current) use of anticoagulants: Secondary | ICD-10-CM | POA: Diagnosis not present

## 2017-07-14 DIAGNOSIS — J449 Chronic obstructive pulmonary disease, unspecified: Secondary | ICD-10-CM | POA: Diagnosis not present

## 2017-07-14 DIAGNOSIS — K219 Gastro-esophageal reflux disease without esophagitis: Secondary | ICD-10-CM | POA: Diagnosis not present

## 2017-07-14 DIAGNOSIS — I270 Primary pulmonary hypertension: Secondary | ICD-10-CM | POA: Diagnosis not present

## 2017-07-14 DIAGNOSIS — I481 Persistent atrial fibrillation: Secondary | ICD-10-CM | POA: Diagnosis not present

## 2017-07-14 DIAGNOSIS — N390 Urinary tract infection, site not specified: Secondary | ICD-10-CM | POA: Diagnosis not present

## 2017-07-14 DIAGNOSIS — I110 Hypertensive heart disease with heart failure: Secondary | ICD-10-CM | POA: Diagnosis not present

## 2017-07-14 DIAGNOSIS — I081 Rheumatic disorders of both mitral and tricuspid valves: Secondary | ICD-10-CM | POA: Diagnosis not present

## 2017-07-14 DIAGNOSIS — G4733 Obstructive sleep apnea (adult) (pediatric): Secondary | ICD-10-CM | POA: Diagnosis not present

## 2017-07-14 DIAGNOSIS — I429 Cardiomyopathy, unspecified: Secondary | ICD-10-CM | POA: Diagnosis not present

## 2017-07-14 DIAGNOSIS — I5022 Chronic systolic (congestive) heart failure: Secondary | ICD-10-CM | POA: Diagnosis not present

## 2017-07-18 DIAGNOSIS — I5022 Chronic systolic (congestive) heart failure: Secondary | ICD-10-CM | POA: Diagnosis not present

## 2017-07-18 DIAGNOSIS — Z7901 Long term (current) use of anticoagulants: Secondary | ICD-10-CM | POA: Diagnosis not present

## 2017-07-18 DIAGNOSIS — G4733 Obstructive sleep apnea (adult) (pediatric): Secondary | ICD-10-CM | POA: Diagnosis not present

## 2017-07-18 DIAGNOSIS — I081 Rheumatic disorders of both mitral and tricuspid valves: Secondary | ICD-10-CM | POA: Diagnosis not present

## 2017-07-18 DIAGNOSIS — I429 Cardiomyopathy, unspecified: Secondary | ICD-10-CM | POA: Diagnosis not present

## 2017-07-18 DIAGNOSIS — I110 Hypertensive heart disease with heart failure: Secondary | ICD-10-CM | POA: Diagnosis not present

## 2017-07-18 DIAGNOSIS — K219 Gastro-esophageal reflux disease without esophagitis: Secondary | ICD-10-CM | POA: Diagnosis not present

## 2017-07-18 DIAGNOSIS — I270 Primary pulmonary hypertension: Secondary | ICD-10-CM | POA: Diagnosis not present

## 2017-07-18 DIAGNOSIS — J449 Chronic obstructive pulmonary disease, unspecified: Secondary | ICD-10-CM | POA: Diagnosis not present

## 2017-07-18 DIAGNOSIS — I481 Persistent atrial fibrillation: Secondary | ICD-10-CM | POA: Diagnosis not present

## 2017-07-19 DIAGNOSIS — I480 Paroxysmal atrial fibrillation: Secondary | ICD-10-CM | POA: Diagnosis not present

## 2017-07-19 DIAGNOSIS — R000 Tachycardia, unspecified: Secondary | ICD-10-CM | POA: Diagnosis not present

## 2017-07-19 DIAGNOSIS — I5022 Chronic systolic (congestive) heart failure: Secondary | ICD-10-CM | POA: Diagnosis not present

## 2017-07-19 DIAGNOSIS — G4733 Obstructive sleep apnea (adult) (pediatric): Secondary | ICD-10-CM | POA: Diagnosis not present

## 2017-07-19 DIAGNOSIS — I43 Cardiomyopathy in diseases classified elsewhere: Secondary | ICD-10-CM | POA: Diagnosis not present

## 2017-07-19 DIAGNOSIS — I10 Essential (primary) hypertension: Secondary | ICD-10-CM | POA: Diagnosis not present

## 2017-07-24 DIAGNOSIS — I480 Paroxysmal atrial fibrillation: Secondary | ICD-10-CM | POA: Diagnosis not present

## 2017-07-25 DIAGNOSIS — I5022 Chronic systolic (congestive) heart failure: Secondary | ICD-10-CM | POA: Diagnosis not present

## 2017-07-25 DIAGNOSIS — J449 Chronic obstructive pulmonary disease, unspecified: Secondary | ICD-10-CM | POA: Diagnosis not present

## 2017-07-25 DIAGNOSIS — K219 Gastro-esophageal reflux disease without esophagitis: Secondary | ICD-10-CM | POA: Diagnosis not present

## 2017-07-25 DIAGNOSIS — Z7901 Long term (current) use of anticoagulants: Secondary | ICD-10-CM | POA: Diagnosis not present

## 2017-07-25 DIAGNOSIS — I081 Rheumatic disorders of both mitral and tricuspid valves: Secondary | ICD-10-CM | POA: Diagnosis not present

## 2017-07-25 DIAGNOSIS — I429 Cardiomyopathy, unspecified: Secondary | ICD-10-CM | POA: Diagnosis not present

## 2017-07-25 DIAGNOSIS — I110 Hypertensive heart disease with heart failure: Secondary | ICD-10-CM | POA: Diagnosis not present

## 2017-07-25 DIAGNOSIS — I481 Persistent atrial fibrillation: Secondary | ICD-10-CM | POA: Diagnosis not present

## 2017-07-25 DIAGNOSIS — I270 Primary pulmonary hypertension: Secondary | ICD-10-CM | POA: Diagnosis not present

## 2017-07-25 DIAGNOSIS — G4733 Obstructive sleep apnea (adult) (pediatric): Secondary | ICD-10-CM | POA: Diagnosis not present

## 2017-08-01 DIAGNOSIS — I081 Rheumatic disorders of both mitral and tricuspid valves: Secondary | ICD-10-CM | POA: Diagnosis not present

## 2017-08-01 DIAGNOSIS — J449 Chronic obstructive pulmonary disease, unspecified: Secondary | ICD-10-CM | POA: Diagnosis not present

## 2017-08-01 DIAGNOSIS — I5022 Chronic systolic (congestive) heart failure: Secondary | ICD-10-CM | POA: Diagnosis not present

## 2017-08-01 DIAGNOSIS — I270 Primary pulmonary hypertension: Secondary | ICD-10-CM | POA: Diagnosis not present

## 2017-08-01 DIAGNOSIS — K219 Gastro-esophageal reflux disease without esophagitis: Secondary | ICD-10-CM | POA: Diagnosis not present

## 2017-08-01 DIAGNOSIS — G4733 Obstructive sleep apnea (adult) (pediatric): Secondary | ICD-10-CM | POA: Diagnosis not present

## 2017-08-01 DIAGNOSIS — I429 Cardiomyopathy, unspecified: Secondary | ICD-10-CM | POA: Diagnosis not present

## 2017-08-01 DIAGNOSIS — Z7901 Long term (current) use of anticoagulants: Secondary | ICD-10-CM | POA: Diagnosis not present

## 2017-08-01 DIAGNOSIS — I110 Hypertensive heart disease with heart failure: Secondary | ICD-10-CM | POA: Diagnosis not present

## 2017-08-01 DIAGNOSIS — I481 Persistent atrial fibrillation: Secondary | ICD-10-CM | POA: Diagnosis not present

## 2017-08-03 DIAGNOSIS — R000 Tachycardia, unspecified: Secondary | ICD-10-CM | POA: Diagnosis not present

## 2017-08-03 DIAGNOSIS — I481 Persistent atrial fibrillation: Secondary | ICD-10-CM | POA: Diagnosis not present

## 2017-08-03 DIAGNOSIS — G4733 Obstructive sleep apnea (adult) (pediatric): Secondary | ICD-10-CM | POA: Diagnosis not present

## 2017-08-03 DIAGNOSIS — I5022 Chronic systolic (congestive) heart failure: Secondary | ICD-10-CM | POA: Diagnosis not present

## 2017-08-03 DIAGNOSIS — I43 Cardiomyopathy in diseases classified elsewhere: Secondary | ICD-10-CM | POA: Diagnosis not present

## 2017-08-03 DIAGNOSIS — I10 Essential (primary) hypertension: Secondary | ICD-10-CM | POA: Diagnosis not present

## 2017-08-03 DIAGNOSIS — I4891 Unspecified atrial fibrillation: Secondary | ICD-10-CM | POA: Diagnosis not present

## 2017-08-03 DIAGNOSIS — I480 Paroxysmal atrial fibrillation: Secondary | ICD-10-CM | POA: Diagnosis not present

## 2017-08-03 DIAGNOSIS — Z9989 Dependence on other enabling machines and devices: Secondary | ICD-10-CM | POA: Diagnosis not present

## 2017-08-07 DIAGNOSIS — G4733 Obstructive sleep apnea (adult) (pediatric): Secondary | ICD-10-CM | POA: Diagnosis not present

## 2017-08-07 DIAGNOSIS — I480 Paroxysmal atrial fibrillation: Secondary | ICD-10-CM | POA: Diagnosis not present

## 2017-08-07 DIAGNOSIS — I5022 Chronic systolic (congestive) heart failure: Secondary | ICD-10-CM | POA: Diagnosis not present

## 2017-08-07 DIAGNOSIS — Z9989 Dependence on other enabling machines and devices: Secondary | ICD-10-CM | POA: Diagnosis not present

## 2017-08-08 DIAGNOSIS — I481 Persistent atrial fibrillation: Secondary | ICD-10-CM | POA: Diagnosis not present

## 2017-10-06 DIAGNOSIS — E782 Mixed hyperlipidemia: Secondary | ICD-10-CM | POA: Diagnosis not present

## 2017-10-06 DIAGNOSIS — I5022 Chronic systolic (congestive) heart failure: Secondary | ICD-10-CM | POA: Diagnosis not present

## 2017-10-06 DIAGNOSIS — I4892 Unspecified atrial flutter: Secondary | ICD-10-CM | POA: Diagnosis not present

## 2017-10-06 DIAGNOSIS — I4891 Unspecified atrial fibrillation: Secondary | ICD-10-CM | POA: Diagnosis not present

## 2017-10-06 DIAGNOSIS — R0602 Shortness of breath: Secondary | ICD-10-CM | POA: Diagnosis not present

## 2017-10-06 DIAGNOSIS — I10 Essential (primary) hypertension: Secondary | ICD-10-CM | POA: Diagnosis not present

## 2017-10-06 DIAGNOSIS — M109 Gout, unspecified: Secondary | ICD-10-CM | POA: Diagnosis not present

## 2017-10-06 DIAGNOSIS — N179 Acute kidney failure, unspecified: Secondary | ICD-10-CM | POA: Diagnosis not present

## 2017-10-19 DIAGNOSIS — R9431 Abnormal electrocardiogram [ECG] [EKG]: Secondary | ICD-10-CM | POA: Diagnosis not present

## 2017-10-19 DIAGNOSIS — I5022 Chronic systolic (congestive) heart failure: Secondary | ICD-10-CM | POA: Diagnosis not present

## 2017-10-19 DIAGNOSIS — Z9989 Dependence on other enabling machines and devices: Secondary | ICD-10-CM | POA: Diagnosis not present

## 2017-10-19 DIAGNOSIS — G4733 Obstructive sleep apnea (adult) (pediatric): Secondary | ICD-10-CM | POA: Diagnosis not present

## 2017-10-19 DIAGNOSIS — I480 Paroxysmal atrial fibrillation: Secondary | ICD-10-CM | POA: Diagnosis not present

## 2017-10-19 DIAGNOSIS — I43 Cardiomyopathy in diseases classified elsewhere: Secondary | ICD-10-CM | POA: Diagnosis not present

## 2017-10-19 DIAGNOSIS — I481 Persistent atrial fibrillation: Secondary | ICD-10-CM | POA: Diagnosis not present

## 2017-10-19 DIAGNOSIS — J449 Chronic obstructive pulmonary disease, unspecified: Secondary | ICD-10-CM | POA: Diagnosis not present

## 2017-10-19 DIAGNOSIS — Z7901 Long term (current) use of anticoagulants: Secondary | ICD-10-CM | POA: Diagnosis not present

## 2017-10-19 DIAGNOSIS — Z8679 Personal history of other diseases of the circulatory system: Secondary | ICD-10-CM | POA: Diagnosis not present

## 2017-10-19 DIAGNOSIS — R000 Tachycardia, unspecified: Secondary | ICD-10-CM | POA: Diagnosis not present

## 2017-10-19 DIAGNOSIS — K219 Gastro-esophageal reflux disease without esophagitis: Secondary | ICD-10-CM | POA: Diagnosis not present

## 2017-10-19 DIAGNOSIS — I10 Essential (primary) hypertension: Secondary | ICD-10-CM | POA: Diagnosis not present

## 2017-10-20 DIAGNOSIS — I480 Paroxysmal atrial fibrillation: Secondary | ICD-10-CM | POA: Diagnosis not present

## 2017-10-20 DIAGNOSIS — I5022 Chronic systolic (congestive) heart failure: Secondary | ICD-10-CM | POA: Diagnosis not present

## 2017-10-20 DIAGNOSIS — J449 Chronic obstructive pulmonary disease, unspecified: Secondary | ICD-10-CM | POA: Diagnosis not present

## 2017-10-20 DIAGNOSIS — I481 Persistent atrial fibrillation: Secondary | ICD-10-CM | POA: Diagnosis not present

## 2017-10-20 DIAGNOSIS — I43 Cardiomyopathy in diseases classified elsewhere: Secondary | ICD-10-CM | POA: Diagnosis not present

## 2017-10-20 DIAGNOSIS — Z6830 Body mass index (BMI) 30.0-30.9, adult: Secondary | ICD-10-CM | POA: Diagnosis not present

## 2017-10-20 DIAGNOSIS — I110 Hypertensive heart disease with heart failure: Secondary | ICD-10-CM | POA: Diagnosis not present

## 2017-10-20 DIAGNOSIS — E669 Obesity, unspecified: Secondary | ICD-10-CM | POA: Diagnosis not present

## 2017-10-20 DIAGNOSIS — I4892 Unspecified atrial flutter: Secondary | ICD-10-CM | POA: Diagnosis not present

## 2017-10-20 DIAGNOSIS — R000 Tachycardia, unspecified: Secondary | ICD-10-CM | POA: Diagnosis not present

## 2017-10-20 DIAGNOSIS — G4733 Obstructive sleep apnea (adult) (pediatric): Secondary | ICD-10-CM | POA: Diagnosis not present

## 2017-10-20 DIAGNOSIS — I483 Typical atrial flutter: Secondary | ICD-10-CM | POA: Diagnosis not present

## 2017-10-21 DIAGNOSIS — I480 Paroxysmal atrial fibrillation: Secondary | ICD-10-CM | POA: Diagnosis not present

## 2017-10-21 DIAGNOSIS — I481 Persistent atrial fibrillation: Secondary | ICD-10-CM | POA: Diagnosis not present

## 2017-10-21 DIAGNOSIS — Z6830 Body mass index (BMI) 30.0-30.9, adult: Secondary | ICD-10-CM | POA: Diagnosis not present

## 2017-10-21 DIAGNOSIS — I5022 Chronic systolic (congestive) heart failure: Secondary | ICD-10-CM | POA: Diagnosis not present

## 2017-10-21 DIAGNOSIS — R000 Tachycardia, unspecified: Secondary | ICD-10-CM | POA: Diagnosis not present

## 2017-10-21 DIAGNOSIS — I43 Cardiomyopathy in diseases classified elsewhere: Secondary | ICD-10-CM | POA: Diagnosis not present

## 2017-10-21 DIAGNOSIS — G4733 Obstructive sleep apnea (adult) (pediatric): Secondary | ICD-10-CM | POA: Diagnosis not present

## 2017-10-21 DIAGNOSIS — I4892 Unspecified atrial flutter: Secondary | ICD-10-CM | POA: Diagnosis not present

## 2017-10-21 DIAGNOSIS — E669 Obesity, unspecified: Secondary | ICD-10-CM | POA: Diagnosis not present

## 2017-10-22 DIAGNOSIS — Z6830 Body mass index (BMI) 30.0-30.9, adult: Secondary | ICD-10-CM | POA: Diagnosis not present

## 2017-10-22 DIAGNOSIS — G4733 Obstructive sleep apnea (adult) (pediatric): Secondary | ICD-10-CM | POA: Diagnosis not present

## 2017-10-22 DIAGNOSIS — E669 Obesity, unspecified: Secondary | ICD-10-CM | POA: Diagnosis not present

## 2017-10-22 DIAGNOSIS — R000 Tachycardia, unspecified: Secondary | ICD-10-CM | POA: Diagnosis not present

## 2017-10-22 DIAGNOSIS — I480 Paroxysmal atrial fibrillation: Secondary | ICD-10-CM | POA: Diagnosis not present

## 2017-10-22 DIAGNOSIS — I4892 Unspecified atrial flutter: Secondary | ICD-10-CM | POA: Diagnosis not present

## 2017-10-22 DIAGNOSIS — I43 Cardiomyopathy in diseases classified elsewhere: Secondary | ICD-10-CM | POA: Diagnosis not present

## 2017-10-22 DIAGNOSIS — I481 Persistent atrial fibrillation: Secondary | ICD-10-CM | POA: Diagnosis not present

## 2017-10-22 DIAGNOSIS — I5022 Chronic systolic (congestive) heart failure: Secondary | ICD-10-CM | POA: Diagnosis not present

## 2017-11-01 DIAGNOSIS — G4733 Obstructive sleep apnea (adult) (pediatric): Secondary | ICD-10-CM | POA: Diagnosis not present

## 2017-11-13 DIAGNOSIS — I10 Essential (primary) hypertension: Secondary | ICD-10-CM | POA: Diagnosis not present

## 2017-11-13 DIAGNOSIS — E782 Mixed hyperlipidemia: Secondary | ICD-10-CM | POA: Diagnosis not present

## 2017-11-13 DIAGNOSIS — R946 Abnormal results of thyroid function studies: Secondary | ICD-10-CM | POA: Diagnosis not present

## 2017-11-15 DIAGNOSIS — I5020 Unspecified systolic (congestive) heart failure: Secondary | ICD-10-CM | POA: Diagnosis not present

## 2017-11-15 DIAGNOSIS — I4891 Unspecified atrial fibrillation: Secondary | ICD-10-CM | POA: Diagnosis not present

## 2017-11-15 DIAGNOSIS — E039 Hypothyroidism, unspecified: Secondary | ICD-10-CM | POA: Diagnosis not present

## 2017-11-15 DIAGNOSIS — E782 Mixed hyperlipidemia: Secondary | ICD-10-CM | POA: Diagnosis not present

## 2017-11-15 DIAGNOSIS — I10 Essential (primary) hypertension: Secondary | ICD-10-CM | POA: Diagnosis not present

## 2017-11-15 DIAGNOSIS — Z6830 Body mass index (BMI) 30.0-30.9, adult: Secondary | ICD-10-CM | POA: Diagnosis not present

## 2017-11-20 DIAGNOSIS — G4733 Obstructive sleep apnea (adult) (pediatric): Secondary | ICD-10-CM | POA: Diagnosis not present

## 2017-11-20 DIAGNOSIS — I43 Cardiomyopathy in diseases classified elsewhere: Secondary | ICD-10-CM | POA: Diagnosis not present

## 2017-11-20 DIAGNOSIS — Z8679 Personal history of other diseases of the circulatory system: Secondary | ICD-10-CM | POA: Diagnosis not present

## 2017-11-20 DIAGNOSIS — I480 Paroxysmal atrial fibrillation: Secondary | ICD-10-CM | POA: Diagnosis not present

## 2017-11-20 DIAGNOSIS — I10 Essential (primary) hypertension: Secondary | ICD-10-CM | POA: Diagnosis not present

## 2017-11-20 DIAGNOSIS — Z9989 Dependence on other enabling machines and devices: Secondary | ICD-10-CM | POA: Diagnosis not present

## 2017-11-20 DIAGNOSIS — Z9889 Other specified postprocedural states: Secondary | ICD-10-CM | POA: Diagnosis not present

## 2017-11-20 DIAGNOSIS — R000 Tachycardia, unspecified: Secondary | ICD-10-CM | POA: Diagnosis not present

## 2017-12-20 DIAGNOSIS — E782 Mixed hyperlipidemia: Secondary | ICD-10-CM | POA: Diagnosis not present

## 2017-12-20 DIAGNOSIS — I4892 Unspecified atrial flutter: Secondary | ICD-10-CM | POA: Diagnosis not present

## 2017-12-20 DIAGNOSIS — N179 Acute kidney failure, unspecified: Secondary | ICD-10-CM | POA: Diagnosis not present

## 2017-12-20 DIAGNOSIS — I5022 Chronic systolic (congestive) heart failure: Secondary | ICD-10-CM | POA: Diagnosis not present

## 2017-12-20 DIAGNOSIS — I10 Essential (primary) hypertension: Secondary | ICD-10-CM | POA: Diagnosis not present

## 2017-12-20 DIAGNOSIS — I4891 Unspecified atrial fibrillation: Secondary | ICD-10-CM | POA: Diagnosis not present

## 2017-12-20 DIAGNOSIS — M109 Gout, unspecified: Secondary | ICD-10-CM | POA: Diagnosis not present

## 2017-12-22 DIAGNOSIS — E039 Hypothyroidism, unspecified: Secondary | ICD-10-CM | POA: Diagnosis not present

## 2017-12-22 DIAGNOSIS — I5020 Unspecified systolic (congestive) heart failure: Secondary | ICD-10-CM | POA: Diagnosis not present

## 2017-12-22 DIAGNOSIS — M109 Gout, unspecified: Secondary | ICD-10-CM | POA: Diagnosis not present

## 2017-12-22 DIAGNOSIS — I10 Essential (primary) hypertension: Secondary | ICD-10-CM | POA: Diagnosis not present

## 2017-12-22 DIAGNOSIS — E782 Mixed hyperlipidemia: Secondary | ICD-10-CM | POA: Diagnosis not present

## 2017-12-22 DIAGNOSIS — I4892 Unspecified atrial flutter: Secondary | ICD-10-CM | POA: Diagnosis not present

## 2018-01-24 DIAGNOSIS — I10 Essential (primary) hypertension: Secondary | ICD-10-CM | POA: Diagnosis not present

## 2018-01-24 DIAGNOSIS — E782 Mixed hyperlipidemia: Secondary | ICD-10-CM | POA: Diagnosis not present

## 2018-01-24 DIAGNOSIS — E039 Hypothyroidism, unspecified: Secondary | ICD-10-CM | POA: Diagnosis not present

## 2018-01-31 DIAGNOSIS — E782 Mixed hyperlipidemia: Secondary | ICD-10-CM | POA: Diagnosis not present

## 2018-01-31 DIAGNOSIS — I10 Essential (primary) hypertension: Secondary | ICD-10-CM | POA: Diagnosis not present

## 2018-01-31 DIAGNOSIS — I5020 Unspecified systolic (congestive) heart failure: Secondary | ICD-10-CM | POA: Diagnosis not present

## 2018-01-31 DIAGNOSIS — I4892 Unspecified atrial flutter: Secondary | ICD-10-CM | POA: Diagnosis not present

## 2018-01-31 DIAGNOSIS — M109 Gout, unspecified: Secondary | ICD-10-CM | POA: Diagnosis not present

## 2018-01-31 DIAGNOSIS — E039 Hypothyroidism, unspecified: Secondary | ICD-10-CM | POA: Diagnosis not present

## 2018-03-08 DIAGNOSIS — G4733 Obstructive sleep apnea (adult) (pediatric): Secondary | ICD-10-CM | POA: Diagnosis not present

## 2018-03-14 DIAGNOSIS — I5020 Unspecified systolic (congestive) heart failure: Secondary | ICD-10-CM | POA: Diagnosis not present

## 2018-03-14 DIAGNOSIS — I4892 Unspecified atrial flutter: Secondary | ICD-10-CM | POA: Diagnosis not present

## 2018-03-14 DIAGNOSIS — E039 Hypothyroidism, unspecified: Secondary | ICD-10-CM | POA: Diagnosis not present

## 2018-03-14 DIAGNOSIS — E782 Mixed hyperlipidemia: Secondary | ICD-10-CM | POA: Diagnosis not present

## 2018-03-14 DIAGNOSIS — I10 Essential (primary) hypertension: Secondary | ICD-10-CM | POA: Diagnosis not present

## 2018-03-14 DIAGNOSIS — M109 Gout, unspecified: Secondary | ICD-10-CM | POA: Diagnosis not present

## 2018-03-28 DIAGNOSIS — Z1211 Encounter for screening for malignant neoplasm of colon: Secondary | ICD-10-CM | POA: Diagnosis not present

## 2018-03-28 DIAGNOSIS — Z1212 Encounter for screening for malignant neoplasm of rectum: Secondary | ICD-10-CM | POA: Diagnosis not present

## 2018-03-28 LAB — COLOGUARD: Cologuard: POSITIVE — AB

## 2018-04-02 DIAGNOSIS — I4892 Unspecified atrial flutter: Secondary | ICD-10-CM | POA: Diagnosis not present

## 2018-04-02 DIAGNOSIS — E782 Mixed hyperlipidemia: Secondary | ICD-10-CM | POA: Diagnosis not present

## 2018-04-02 DIAGNOSIS — I5020 Unspecified systolic (congestive) heart failure: Secondary | ICD-10-CM | POA: Diagnosis not present

## 2018-04-02 DIAGNOSIS — M109 Gout, unspecified: Secondary | ICD-10-CM | POA: Diagnosis not present

## 2018-04-02 DIAGNOSIS — E039 Hypothyroidism, unspecified: Secondary | ICD-10-CM | POA: Diagnosis not present

## 2018-04-02 DIAGNOSIS — I10 Essential (primary) hypertension: Secondary | ICD-10-CM | POA: Diagnosis not present

## 2018-04-19 ENCOUNTER — Other Ambulatory Visit (INDEPENDENT_AMBULATORY_CARE_PROVIDER_SITE_OTHER): Payer: Self-pay | Admitting: Internal Medicine

## 2018-04-19 ENCOUNTER — Encounter (INDEPENDENT_AMBULATORY_CARE_PROVIDER_SITE_OTHER): Payer: Self-pay | Admitting: *Deleted

## 2018-04-19 ENCOUNTER — Ambulatory Visit (INDEPENDENT_AMBULATORY_CARE_PROVIDER_SITE_OTHER): Payer: Medicare HMO | Admitting: Internal Medicine

## 2018-04-19 ENCOUNTER — Encounter (INDEPENDENT_AMBULATORY_CARE_PROVIDER_SITE_OTHER): Payer: Self-pay | Admitting: Internal Medicine

## 2018-04-19 VITALS — BP 189/72 | HR 61 | Temp 98.2°F | Ht 67.5 in | Wt 211.6 lb

## 2018-04-19 DIAGNOSIS — R195 Other fecal abnormalities: Secondary | ICD-10-CM

## 2018-04-19 NOTE — Telephone Encounter (Signed)
Error   This encounter was created in error - please disregard. 

## 2018-04-19 NOTE — Progress Notes (Signed)
Subjective:    Patient ID: Lindsay Whitehead, female    DOB: September 26, 1945, 73 y.o.   MRN: [id number] (Patient is on C-pap)  HPI Referred by Dr. Margo Aye  for positive cologuard. The cologuard was explained to the patient. She says she cannot drink a lot of water. No changes in her stools. No family hx of colon cancer. Her appetite is not good. No weight loss. She has gained 20 pounds over the past 3-4 months. Has 2 BMs a day. No melena or BRRB   Hx of EBR:[id number], AFIB, CHF, COPD, Gout, OSA, Hypothyroid.   Review of Systems Past Medical History:  Diagnosis Date  . Atrial flutter (HCC)   . Diastolic CHF (HCC)   . Episodic atrial fibrillation (HCC)    Converted to NSR recent hospitalization 01/2013 on Dilt  . Fatigue   . Hypercholesterolemia   . Hypertension   . Obesity   . Right bundle branch block    Appears to be chronic  . Vertigo     Past Surgical History:  Procedure Laterality Date  . carpel tunnel surgery    . WRIST FRACTURE SURGERY      Allergies  Allergen Reactions  . Bystolic [Nebivolol Hcl] Other (See Comments)    Extreme chest pains/ lowering of blood pressure causing dizziness, falling  . Metoprolol Other (See Comments)    Extreme drop in blood pressure, vertigo resulted  . Asa [Aspirin]     Bleeding gums  . Diltiazem     bleeding gums, stomach ache,weekness   . Edarbi [Azilsartan] Hives  . Lasix [Furosemide] Other (See Comments)    Cramping in thigh area   . Zantac [Ranitidine Hcl]     Current Outpatient Medications on File Prior to Visit  Medication Sig Dispense Refill  . Biotin 03159 MCG TABS Take 5,000 mcg by mouth daily.    . carvedilol (COREG) 3.125 MG tablet Take 3.125 mg by mouth daily.    Marland Kitchen esomeprazole (NEXIUM) 20 MG capsule Take 20 mg by mouth as needed. Reported on 09/07/2015    . furosemide (LASIX) 40 MG tablet Take 40 mg by mouth as needed.    . Levothyroxine Sodium 112 MCG CAPS Take 12 mcg by mouth daily before breakfast. 1/2 tab  daily.    Marland Kitchen MAGNESIUM-ZINC PO Take by mouth.    . NON FORMULARY daily. [place] 150mg     . potassium chloride (K-DUR) 10 MEQ tablet Take 1 tablet (10 mEq total) by mouth daily. 90 tablet 2  . vitamin B-12 (CYANOCOBALAMIN) 1000 MCG tablet Take 1,000 mcg by mouth daily.    . furosemide (LASIX) 40 MG tablet Take 1 tablet (40 mg total) by mouth daily. 90 tablet 3   No current facility-administered medications on file prior to visit.         Objective:   Physical Exam Blood pressure (!) 189/72, pulse 61, temperature 98.2 F (36.8 C), height 5' 7.5" (1.715 m), weight 211 lb 9.6 oz (96 kg). Alert and oriented. Skin warm and dry. Oral mucosa is moist.   . Sclera anicteric, conjunctivae is pink. Thyroid not enlarged. No cervical lymphadenopathy. Lungs clear. Heart regular rate and rhythm.  Abdomen is soft. Bowel sounds are positive. No hepatomegaly. No abdominal masses felt. No tenderness.  No edema to lower extremities.          Assessment & Plan:  Positive cologuard. The risks of bleeding, perforation and infection were reviewed with patient. Colonoscopy with propofol. ( Patient is on C-pap).

## 2018-05-10 DIAGNOSIS — I340 Nonrheumatic mitral (valve) insufficiency: Secondary | ICD-10-CM | POA: Diagnosis not present

## 2018-05-10 DIAGNOSIS — K219 Gastro-esophageal reflux disease without esophagitis: Secondary | ICD-10-CM | POA: Diagnosis not present

## 2018-05-10 DIAGNOSIS — I219 Acute myocardial infarction, unspecified: Secondary | ICD-10-CM | POA: Diagnosis not present

## 2018-05-10 DIAGNOSIS — I10 Essential (primary) hypertension: Secondary | ICD-10-CM | POA: Diagnosis not present

## 2018-05-10 DIAGNOSIS — I5022 Chronic systolic (congestive) heart failure: Secondary | ICD-10-CM | POA: Diagnosis not present

## 2018-05-10 DIAGNOSIS — Z9989 Dependence on other enabling machines and devices: Secondary | ICD-10-CM | POA: Diagnosis not present

## 2018-05-10 DIAGNOSIS — I480 Paroxysmal atrial fibrillation: Secondary | ICD-10-CM | POA: Diagnosis not present

## 2018-05-10 DIAGNOSIS — Z8371 Family history of colonic polyps: Secondary | ICD-10-CM | POA: Diagnosis not present

## 2018-05-10 DIAGNOSIS — G4733 Obstructive sleep apnea (adult) (pediatric): Secondary | ICD-10-CM | POA: Diagnosis not present

## 2018-05-10 DIAGNOSIS — R000 Tachycardia, unspecified: Secondary | ICD-10-CM | POA: Diagnosis not present

## 2018-05-10 DIAGNOSIS — Z862 Personal history of diseases of the blood and blood-forming organs and certain disorders involving the immune mechanism: Secondary | ICD-10-CM | POA: Diagnosis not present

## 2018-05-10 DIAGNOSIS — I491 Atrial premature depolarization: Secondary | ICD-10-CM | POA: Diagnosis not present

## 2018-05-10 DIAGNOSIS — I43 Cardiomyopathy in diseases classified elsewhere: Secondary | ICD-10-CM | POA: Diagnosis not present

## 2018-05-10 DIAGNOSIS — I444 Left anterior fascicular block: Secondary | ICD-10-CM | POA: Diagnosis not present

## 2018-05-10 DIAGNOSIS — I110 Hypertensive heart disease with heart failure: Secondary | ICD-10-CM | POA: Diagnosis not present

## 2018-05-10 DIAGNOSIS — R195 Other fecal abnormalities: Secondary | ICD-10-CM | POA: Diagnosis not present

## 2018-05-30 DIAGNOSIS — E039 Hypothyroidism, unspecified: Secondary | ICD-10-CM | POA: Diagnosis not present

## 2018-05-30 DIAGNOSIS — R7301 Impaired fasting glucose: Secondary | ICD-10-CM | POA: Diagnosis not present

## 2018-05-30 DIAGNOSIS — I5022 Chronic systolic (congestive) heart failure: Secondary | ICD-10-CM | POA: Diagnosis not present

## 2018-05-30 DIAGNOSIS — N179 Acute kidney failure, unspecified: Secondary | ICD-10-CM | POA: Diagnosis not present

## 2018-05-30 DIAGNOSIS — Z0000 Encounter for general adult medical examination without abnormal findings: Secondary | ICD-10-CM | POA: Diagnosis not present

## 2018-05-30 DIAGNOSIS — E782 Mixed hyperlipidemia: Secondary | ICD-10-CM | POA: Diagnosis not present

## 2018-05-30 DIAGNOSIS — I10 Essential (primary) hypertension: Secondary | ICD-10-CM | POA: Diagnosis not present

## 2018-05-31 DIAGNOSIS — I4891 Unspecified atrial fibrillation: Secondary | ICD-10-CM | POA: Diagnosis not present

## 2018-05-31 DIAGNOSIS — R300 Dysuria: Secondary | ICD-10-CM | POA: Diagnosis not present

## 2018-05-31 DIAGNOSIS — E782 Mixed hyperlipidemia: Secondary | ICD-10-CM | POA: Diagnosis not present

## 2018-05-31 DIAGNOSIS — I5020 Unspecified systolic (congestive) heart failure: Secondary | ICD-10-CM | POA: Diagnosis not present

## 2018-05-31 DIAGNOSIS — E039 Hypothyroidism, unspecified: Secondary | ICD-10-CM | POA: Diagnosis not present

## 2018-05-31 DIAGNOSIS — R358 Other polyuria: Secondary | ICD-10-CM | POA: Diagnosis not present

## 2018-05-31 DIAGNOSIS — I10 Essential (primary) hypertension: Secondary | ICD-10-CM | POA: Diagnosis not present

## 2018-06-07 DIAGNOSIS — Z0000 Encounter for general adult medical examination without abnormal findings: Secondary | ICD-10-CM | POA: Diagnosis not present

## 2018-06-21 DIAGNOSIS — G4733 Obstructive sleep apnea (adult) (pediatric): Secondary | ICD-10-CM | POA: Diagnosis not present

## 2018-06-26 ENCOUNTER — Other Ambulatory Visit: Payer: Self-pay | Admitting: *Deleted

## 2018-07-18 DIAGNOSIS — E039 Hypothyroidism, unspecified: Secondary | ICD-10-CM | POA: Diagnosis not present

## 2018-07-18 DIAGNOSIS — E782 Mixed hyperlipidemia: Secondary | ICD-10-CM | POA: Diagnosis not present

## 2018-07-18 DIAGNOSIS — I5020 Unspecified systolic (congestive) heart failure: Secondary | ICD-10-CM | POA: Diagnosis not present

## 2018-07-18 DIAGNOSIS — I10 Essential (primary) hypertension: Secondary | ICD-10-CM | POA: Diagnosis not present

## 2018-07-24 IMAGING — NM NM MYOCAR MULTI W/SPECT W/WALL MOTION & EF
2 series · 12 of 12 positions shown · non-contrast
Comparison: none

[Series 1: rest · 8.28mm/px · 6 of 64 frames shown]
[frame 6/64]
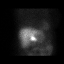
[frame 16/64]
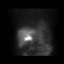
[frame 27/64]
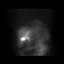
[frame 38/64]
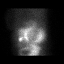
[frame 48/64]
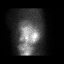
[frame 59/64]
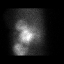

[Series 2: stress gated · 8.28mm/px · 6 of 64 frames shown]
[frame 6/64]
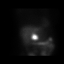
[frame 16/64]
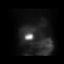
[frame 27/64]
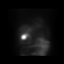
[frame 38/64]
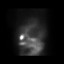
[frame 48/64]
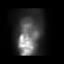
[frame 59/64]
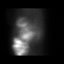

[12 of 12 positions shown; findings below may reference images not displayed]

Canned report from images found in remote index.

Refer to host system for actual result text.

## 2018-08-04 DIAGNOSIS — I10 Essential (primary) hypertension: Secondary | ICD-10-CM | POA: Diagnosis not present

## 2018-08-04 DIAGNOSIS — E039 Hypothyroidism, unspecified: Secondary | ICD-10-CM | POA: Diagnosis not present

## 2018-08-04 DIAGNOSIS — I5020 Unspecified systolic (congestive) heart failure: Secondary | ICD-10-CM | POA: Diagnosis not present

## 2018-08-04 DIAGNOSIS — E782 Mixed hyperlipidemia: Secondary | ICD-10-CM | POA: Diagnosis not present

## 2018-08-09 DIAGNOSIS — L03818 Cellulitis of other sites: Secondary | ICD-10-CM | POA: Diagnosis not present

## 2018-08-09 DIAGNOSIS — W57XXXA Bitten or stung by nonvenomous insect and other nonvenomous arthropods, initial encounter: Secondary | ICD-10-CM | POA: Diagnosis not present

## 2018-08-31 ENCOUNTER — Observation Stay (HOSPITAL_COMMUNITY)
Admission: EM | Admit: 2018-08-31 | Discharge: 2018-09-01 | Disposition: A | Discharge status: Home / Self Care | Payer: Medicare HMO | Attending: Internal Medicine | Admitting: Internal Medicine

## 2018-08-31 ENCOUNTER — Emergency Department (HOSPITAL_COMMUNITY): Payer: Medicare HMO

## 2018-08-31 ENCOUNTER — Encounter (HOSPITAL_COMMUNITY): Payer: Self-pay

## 2018-08-31 ENCOUNTER — Other Ambulatory Visit: Payer: Self-pay

## 2018-08-31 DIAGNOSIS — J9601 Acute respiratory failure with hypoxia: Principal | ICD-10-CM | POA: Diagnosis present

## 2018-08-31 DIAGNOSIS — R0902 Hypoxemia: Secondary | ICD-10-CM

## 2018-08-31 DIAGNOSIS — I110 Hypertensive heart disease with heart failure: Secondary | ICD-10-CM | POA: Diagnosis not present

## 2018-08-31 DIAGNOSIS — Z20828 Contact with and (suspected) exposure to other viral communicable diseases: Secondary | ICD-10-CM | POA: Diagnosis not present

## 2018-08-31 DIAGNOSIS — R0602 Shortness of breath: Secondary | ICD-10-CM | POA: Diagnosis not present

## 2018-08-31 DIAGNOSIS — I5030 Unspecified diastolic (congestive) heart failure: Secondary | ICD-10-CM | POA: Diagnosis not present

## 2018-08-31 DIAGNOSIS — I10 Essential (primary) hypertension: Secondary | ICD-10-CM | POA: Diagnosis not present

## 2018-08-31 DIAGNOSIS — R509 Fever, unspecified: Secondary | ICD-10-CM | POA: Diagnosis not present

## 2018-08-31 DIAGNOSIS — Z209 Contact with and (suspected) exposure to unspecified communicable disease: Secondary | ICD-10-CM | POA: Diagnosis not present

## 2018-08-31 DIAGNOSIS — J80 Acute respiratory distress syndrome: Secondary | ICD-10-CM | POA: Diagnosis not present

## 2018-08-31 DIAGNOSIS — Z79899 Other long term (current) drug therapy: Secondary | ICD-10-CM | POA: Diagnosis not present

## 2018-08-31 DIAGNOSIS — I4891 Unspecified atrial fibrillation: Secondary | ICD-10-CM | POA: Diagnosis not present

## 2018-08-31 LAB — COMPREHENSIVE METABOLIC PANEL
ALT: 27 U/L (ref 0–44)
AST: 31 U/L (ref 15–41)
Albumin: 4.4 g/dL (ref 3.5–5.0)
Alkaline Phosphatase: 95 U/L (ref 38–126)
Anion gap: 13 (ref 5–15)
BUN: 12 mg/dL (ref 8–23)
CO2: 24 mmol/L (ref 22–32)
Calcium: 8.7 mg/dL — ABNORMAL LOW (ref 8.9–10.3)
Chloride: 101 mmol/L (ref 98–111)
Creatinine, Ser: 0.72 mg/dL (ref 0.44–1.00)
GFR calc Af Amer: >60 mL/min (ref >60)
GFR calc non Af Amer: >60 mL/min (ref >60)
Glucose, Bld: 121 mg/dL — ABNORMAL HIGH (ref 70–99)
Potassium: 3.6 mmol/L (ref 3.5–5.1)
Sodium: 138 mmol/L (ref 135–145)
Total Bilirubin: 1.5 mg/dL — ABNORMAL HIGH (ref 0.3–1.2)
Total Protein: 8.1 g/dL (ref 6.5–8.1)

## 2018-08-31 LAB — CBC WITH DIFFERENTIAL/PLATELET
Abs Immature Granulocytes: 0.08 10*3/uL — ABNORMAL HIGH (ref 0.00–0.07)
Basophils Absolute: 0.1 10*3/uL (ref 0.0–0.1)
Basophils Relative: 0 %
Eosinophils Absolute: 0.3 10*3/uL (ref 0.0–0.5)
Eosinophils Relative: 2 %
HCT: 40.3 % (ref 36.0–46.0)
Hemoglobin: 13.7 g/dL (ref 12.0–15.0)
Immature Granulocytes: 1 %
Lymphocytes Relative: 4 %
Lymphs Abs: 0.5 10*3/uL — ABNORMAL LOW (ref 0.7–4.0)
MCH: 30 pg (ref 26.0–34.0)
MCHC: 34 g/dL (ref 30.0–36.0)
MCV: 88.2 fL (ref 80.0–100.0)
Monocytes Absolute: 0.7 10*3/uL (ref 0.1–1.0)
Monocytes Relative: 5 %
Neutro Abs: 12.6 10*3/uL — ABNORMAL HIGH (ref 1.7–7.7)
Neutrophils Relative %: 88 %
Platelets: 186 10*3/uL (ref 150–400)
RBC: 4.57 MIL/uL (ref 3.87–5.11)
RDW: 13.8 % (ref 11.5–15.5)
WBC: 14.2 10*3/uL — ABNORMAL HIGH (ref 4.0–10.5)
nRBC: 0 % (ref 0.0–0.2)

## 2018-08-31 LAB — URINALYSIS, ROUTINE W REFLEX MICROSCOPIC
Bilirubin Urine: NEGATIVE
Glucose, UA: NEGATIVE mg/dL
Ketones, ur: 5 mg/dL — AB
Leukocytes,Ua: NEGATIVE
Nitrite: NEGATIVE
Protein, ur: NEGATIVE mg/dL
Specific Gravity, Urine: 1.027 (ref 1.005–1.030)
pH: 5 (ref 5.0–8.0)

## 2018-08-31 LAB — BRAIN NATRIURETIC PEPTIDE: B Natriuretic Peptide: 303 pg/mL — ABNORMAL HIGH (ref 0.0–100.0)

## 2018-08-31 LAB — TROPONIN I (HIGH SENSITIVITY)
Troponin I (High Sensitivity): 10 ng/L (ref <18)
Troponin I (High Sensitivity): 12 ng/L (ref <18)

## 2018-08-31 LAB — SARS CORONAVIRUS 2 BY RT PCR (HOSPITAL ORDER, PERFORMED IN ~~LOC~~ HOSPITAL LAB): SARS Coronavirus 2: NEGATIVE

## 2018-08-31 LAB — GROUP A STREP BY PCR: Group A Strep by PCR: NOT DETECTED

## 2018-08-31 LAB — LACTIC ACID, PLASMA
Lactic Acid, Venous: 1.3 mmol/L (ref 0.5–1.9)
Lactic Acid, Venous: 1.1 mmol/L (ref 0.5–1.9)

## 2018-08-31 MED ORDER — ALBUTEROL SULFATE HFA 108 (90 BASE) MCG/ACT IN AERS
8.0000 | INHALATION_SPRAY | Freq: Once | RESPIRATORY_TRACT | Status: AC
  Administered 2018-08-31: 8 via RESPIRATORY_TRACT
  Filled 2018-08-31: qty 6.7

## 2018-08-31 MED ORDER — ACETAMINOPHEN 650 MG RE SUPP: 650.0000 mg | Freq: Four times a day (QID) | RECTAL | Status: DC | PRN

## 2018-08-31 MED ORDER — SODIUM CHLORIDE 0.9% FLUSH
3.0000 mL | Freq: Two times a day (BID) | INTRAVENOUS | Status: DC
  Administered 2018-08-31 – 2018-09-01 (×2): 3 mL via INTRAVENOUS

## 2018-08-31 MED ORDER — VITAMIN B-12 1000 MCG PO TABS
1000.0000 ug | ORAL_TABLET | Freq: Every day | ORAL | Status: DC
  Administered 2018-09-01: 09:00:00 1000 ug via ORAL
  Filled 2018-08-31 (×4): qty 1

## 2018-08-31 MED ORDER — ENOXAPARIN SODIUM 40 MG/0.4ML ~~LOC~~ SOLN
40.0000 mg | SUBCUTANEOUS | Status: DC
  Filled 2018-08-31: qty 0.4

## 2018-08-31 MED ORDER — ONDANSETRON HCL 4 MG/2ML IJ SOLN: 4.0000 mg | Freq: Four times a day (QID) | INTRAMUSCULAR | Status: DC | PRN

## 2018-08-31 MED ORDER — PANTOPRAZOLE SODIUM 40 MG PO TBEC
40.0000 mg | DELAYED_RELEASE_TABLET | Freq: Every day | ORAL | Status: DC
  Administered 2018-09-01: 09:00:00 40 mg via ORAL
  Filled 2018-08-31 (×2): qty 1

## 2018-08-31 MED ORDER — CARVEDILOL 3.125 MG PO TABS
3.1250 mg | ORAL_TABLET | Freq: Every day | ORAL | Status: DC
  Filled 2018-08-31 (×4): qty 1

## 2018-08-31 MED ORDER — ACETAMINOPHEN 325 MG PO TABS
650.0000 mg | ORAL_TABLET | Freq: Once | ORAL | Status: AC
  Administered 2018-08-31: 650 mg via ORAL
  Filled 2018-08-31: qty 2

## 2018-08-31 MED ORDER — POTASSIUM CHLORIDE ER 10 MEQ PO TBCR
10.0000 meq | EXTENDED_RELEASE_TABLET | Freq: Every day | ORAL | Status: DC
  Administered 2018-08-31: 16:00:00 10 meq via ORAL
  Filled 2018-08-31 (×5): qty 1

## 2018-08-31 MED ORDER — IPRATROPIUM BROMIDE 0.02 % IN SOLN
1.0000 mg | Freq: Once | RESPIRATORY_TRACT | Status: AC
  Administered 2018-08-31: 1 mg via RESPIRATORY_TRACT
  Filled 2018-08-31: qty 5

## 2018-08-31 MED ORDER — SODIUM CHLORIDE 0.9% FLUSH: 3.0000 mL | INTRAVENOUS | Status: DC | PRN

## 2018-08-31 MED ORDER — LEVOTHYROXINE SODIUM 25 MCG PO TABS
25.0000 ug | ORAL_TABLET | Freq: Every day | ORAL | Status: DC
  Administered 2018-09-01: 25 ug via ORAL
  Filled 2018-08-31: qty 1

## 2018-08-31 MED ORDER — ONDANSETRON HCL 4 MG PO TABS: 4.0000 mg | ORAL_TABLET | Freq: Four times a day (QID) | ORAL | Status: DC | PRN

## 2018-08-31 MED ORDER — FUROSEMIDE 10 MG/ML IJ SOLN
40.0000 mg | Freq: Once | INTRAMUSCULAR | Status: AC
  Administered 2018-08-31: 15:00:00 40 mg via INTRAVENOUS
  Filled 2018-08-31: qty 4

## 2018-08-31 MED ORDER — ALBUTEROL (5 MG/ML) CONTINUOUS INHALATION SOLN
10.0000 mg/h | INHALATION_SOLUTION | Freq: Once | RESPIRATORY_TRACT | Status: AC
  Administered 2018-08-31: 10 mg/h via RESPIRATORY_TRACT
  Filled 2018-08-31: qty 20

## 2018-08-31 MED ORDER — OXYCODONE HCL 5 MG PO TABS
5.0000 mg | ORAL_TABLET | Freq: Once | ORAL | Status: AC
  Administered 2018-08-31: 5 mg via ORAL
  Filled 2018-08-31: qty 1

## 2018-08-31 MED ORDER — ACETAMINOPHEN 325 MG PO TABS
650.0000 mg | ORAL_TABLET | Freq: Four times a day (QID) | ORAL | Status: DC | PRN
  Administered 2018-08-31 – 2018-09-01 (×2): 650 mg via ORAL
  Filled 2018-08-31 (×2): qty 2

## 2018-08-31 MED ORDER — BIOTIN 10000 MCG PO TABS: 5000.0000 ug | ORAL_TABLET | Freq: Every day | ORAL | Status: DC

## 2018-08-31 MED ORDER — FUROSEMIDE 10 MG/ML IJ SOLN: 40.0000 mg | Freq: Two times a day (BID) | INTRAMUSCULAR | Status: DC

## 2018-08-31 MED ORDER — LEVALBUTEROL HCL 0.63 MG/3ML IN NEBU: 0.6300 mg | INHALATION_SOLUTION | Freq: Four times a day (QID) | RESPIRATORY_TRACT | Status: DC | PRN

## 2018-08-31 MED ORDER — FUROSEMIDE 10 MG/ML IJ SOLN
40.0000 mg | Freq: Two times a day (BID) | INTRAMUSCULAR | Status: DC
  Administered 2018-09-01: 06:00:00 40 mg via INTRAVENOUS
  Filled 2018-08-31: qty 4

## 2018-08-31 MED ORDER — SODIUM CHLORIDE 0.9 % IV SOLN: 250.0000 mL | INTRAVENOUS | Status: DC | PRN

## 2018-08-31 NOTE — ED Triage Notes (Addendum)
Pt brought in by EMS due to SOB, fever since last pm. Pts sats 86% when EMS arrived and came up to 96 with O2  Pt on cpap when EMS arrived. Pt reports HA, ST and nausea

## 2018-08-31 NOTE — ED Notes (Signed)
Floor is capped  Awaiting bed assignment

## 2018-08-31 NOTE — ED Notes (Signed)
Fever with shortness of breath   Weight gain of 2 pounds in the last several days  Here for eval

## 2018-08-31 NOTE — ED Notes (Signed)
Report to Patricia, RN.

## 2018-08-31 NOTE — H&P (Signed)
History and Physical    Lindsay Whitehead GUR:[id number] DOB: 12/14/1945 DOA: 08/31/2018  PCP: Dixie Dials, MD   Patient coming from: Home  Chief Complaint: Dyspnea with hypoxemia  HPI: Lindsay Whitehead is a 73 y.o. female with medical history significant for paroxysmal atrial fibrillation/flutter, diastolic CHF, hypertension, dyslipidemia, obesity, and GERD who presented to the ED emergency department via EMS after she was noted to have some shortness of breath fevers and chills last night.  She was noted to have oxygen saturation of 86% on room air upon EMS arrival which improved rapidly with administration of nasal cannula oxygen.  Patient takes Lasix as needed at home for symptoms and weight gain and did notice a 2 pound weight gain over the last 1 to 2 days.  She denies any PND, orthopnea, chest pain, or lower extremity edema.   ED Course: Vital signs initially demonstrated some fever with temperature 101.9 F.  Vital signs are otherwise stable.  EKG with sinus rhythm noted and leukocytosis of 14,000 noted.  Lactic acid of 1.3.  1 view chest x-ray with no acute findings.  BNP is minimally elevated at 303.  Initial troponin high-sensitivity at 10.  She is currently on 3 L nasal cannula saturating in the mid 90th percentile.  COVID testing and strep testing negative.  She has been given some breathing treatments and Lasix in the ED and is starting to already feel much better.  Review of Systems: All others reviewed and otherwise negative.  Past Medical History:  Diagnosis Date  . Atrial flutter ([place])   . Diastolic CHF ([place])   . Episodic atrial fibrillation (HCC)    Converted to NSR recent hospitalization 01/2013 on Dilt  . Fatigue   . Hypercholesterolemia   . Hypertension   . Obesity   . Right bundle branch block    Appears to be chronic  . Vertigo     Past Surgical History:  Procedure Laterality Date  . carpel tunnel surgery    . TONSILLECTOMY    . WRIST FRACTURE SURGERY        reports that she has never smoked. She has never used smokeless tobacco. She reports that she does not drink alcohol or use drugs.  Allergies  Allergen Reactions  . Bystolic [Nebivolol Hcl] Other (See Comments)    Extreme chest pains/ lowering of blood pressure causing dizziness, falling  . Metoprolol Other (See Comments)    Extreme drop in blood pressure, vertigo resulted  . Asa [Aspirin]     Bleeding gums  . Diltiazem     bleeding gums, stomach ache,weekness   . Edarbi [Azilsartan] Hives  . Lasix [Furosemide] Other (See Comments)    Cramping in thigh area   . Zantac [Ranitidine Hcl]     Family History  Problem Relation Age of Onset  . Asthma Mother   . Hypertension Mother   . COPD Father   . Cancer Other   . Cardiomyopathy Brother        Congenital with need for heart transplant. Died at 89.    Prior to Admission medications   Medication Sig Start Date End Date Taking? Authorizing Provider  carvedilol (COREG) 3.125 MG tablet Take 3.125 mg by mouth daily.   Yes [provider]  furosemide (LASIX) 40 MG tablet Take 40 mg by mouth as needed.   Yes [provider]  levothyroxine (SYNTHROID) 25 MCG tablet Take 25 mcg by mouth daily before breakfast.   Yes [provider]  Biotin 10000 MCG  TABS Take 5,000 mcg by mouth daily.    [provider]  esomeprazole (NEXIUM) 20 MG capsule Take 20 mg by mouth as needed. Reported on 09/07/2015    [provider]  furosemide (LASIX) 40 MG tablet Take 1 tablet (40 mg total) by mouth daily. 12/14/15 03/13/16  Marinus Mawaylor, Gregg W, MD  MAGNESIUM-ZINC PO Take by mouth.    [provider]  NON FORMULARY daily. Sea Kelp 150mg     [provider]  potassium chloride (K-DUR) 10 MEQ tablet Take 1 tablet (10 mEq total) by mouth daily. 09/18/15   Marinus Mawaylor, Gregg W, MD  vitamin B-12 (CYANOCOBALAMIN) 1000 MCG tablet Take 1,000 mcg by mouth daily.    [provider]    Physical Exam:  Vitals:   08/31/18 1330 08/31/18 1345 08/31/18 1400 08/31/18 1415  BP: 134/64  (!) 131/58   Pulse: 82 81 82 81  Resp:      Temp:      TempSrc:      SpO2: 96% 95% 94% 93%  Weight:      Height:        Constitutional: NAD, calm, comfortable Vitals:   08/31/18 1330 08/31/18 1345 08/31/18 1400 08/31/18 1415  BP: 134/64  (!) 131/58   Pulse: 82 81 82 81  Resp:      Temp:      TempSrc:      SpO2: 96% 95% 94% 93%  Weight:      Height:       Eyes: lids and conjunctivae normal ENMT: Mucous membranes are moist.  Neck: normal, supple Respiratory: clear to auscultation bilaterally. Normal respiratory effort. No accessory muscle use.  Currently on 3 L nasal cannula. Cardiovascular: Regular rate and rhythm, no murmurs. No extremity edema. Abdomen: no tenderness, no distention. Bowel sounds positive.  Musculoskeletal:  No joint deformity upper and lower extremities.   Skin: no rashes, lesions, ulcers.  Psychiatric: Normal judgment and insight. Alert and oriented x 3. Normal mood.   Labs on Admission: I have personally reviewed following labs and imaging studies  CBC: Recent Labs  Lab 08/31/18 1201  WBC 14.2*  NEUTROABS 12.6*  HGB 13.7  HCT 40.3  MCV 88.2  PLT 186   Basic Metabolic Panel: Recent Labs  Lab 08/31/18 1201  NA 138  K 3.6  CL 101  CO2 24  GLUCOSE 121*  BUN 12  CREATININE 0.72  CALCIUM 8.7*   GFR: Estimated Creatinine Clearance: 74.4 mL/min (by C-G formula based on SCr of 0.72 mg/dL). Liver Function Tests: Recent Labs  Lab 08/31/18 1201  AST 31  ALT 27  ALKPHOS 95  BILITOT 1.5*  PROT 8.1  ALBUMIN 4.4   No results for input(s): LIPASE, AMYLASE in the last 168 hours. No results for input(s): AMMONIA in the last 168 hours. Coagulation Profile: No results for input(s): INR, PROTIME in the last 168 hours. Cardiac Enzymes: No results for input(s): CKTOTAL, CKMB, CKMBINDEX, TROPONINI in the last 168 hours. BNP (last 3 results) No results for  input(s): PROBNP in the last 8760 hours. HbA1C: No results for input(s): HGBA1C in the last 72 hours. CBG: No results for input(s): GLUCAP in the last 168 hours. Lipid Profile: No results for input(s): CHOL, HDL, LDLCALC, TRIG, CHOLHDL, LDLDIRECT in the last 72 hours. Thyroid Function Tests: No results for input(s): TSH, T4TOTAL, FREET4, T3FREE, THYROIDAB in the last 72 hours. Anemia Panel: No results for input(s): VITAMINB12, FOLATE, FERRITIN, TIBC, IRON, RETICCTPCT in the last 72 hours. Urine analysis:  Component Value Date/Time   COLORURINE YELLOW 08/26/2015 1126   APPEARANCEUR CLEAR 08/26/2015 1126   LABSPEC <1.005 (L) 08/26/2015 1126   PHURINE 7.0 08/26/2015 1126   GLUCOSEU NEGATIVE 08/26/2015 1126   HGBUR TRACE (A) 08/26/2015 1126   BILIRUBINUR NEGATIVE 08/26/2015 1126   KETONESUR NEGATIVE 08/26/2015 1126   PROTEINUR TRACE (A) 08/26/2015 1126   UROBILINOGEN 0.2 07/18/2014 1158   NITRITE NEGATIVE 08/26/2015 1126   LEUKOCYTESUR MODERATE (A) 08/26/2015 1126    Radiological Exams on Admission: Dg Chest Port 1 View  Result Date: 08/31/2018 CLINICAL DATA:  Fever and shortness of breath since last night EXAM: PORTABLE CHEST 1 VIEW COMPARISON:  December 25, 2015 FINDINGS: The heart size and mediastinal contours are within normal limits. Both lungs are clear. The visualized skeletal structures are unremarkable. IMPRESSION: No active cardiopulmonary disease. Electronically Signed   By: Sherian Rein M.D.   On: 08/31/2018 13:08    EKG: Independently reviewed. NSR 94bpm RBBB.  Assessment/Plan Active Problems:   Acute hypoxemic respiratory failure (HCC)    Acute hypoxemic respiratory failure likely secondary to acute on chronic diastolic CHF -Maintain on Lasix IV twice daily and monitor strict I's and O's as well as daily weights -We will likely need to be on Lasix daily at home with close monitoring upon discharge -Repeat 2D echocardiogram -Monitor on telemetry -Wean oxygen  as tolerated to room air -Maintain on Coreg  GERD -PPI  Hypothyroidism -Maintain on levothyroxine   DVT prophylaxis: Lovenox Code Status: Full Family Communication:None at bedside  Disposition Plan:Wean O2 with IV Lasix and breathing treatments Consults called:None Admission status: Obs, tele   Antoney Biven Hoover Brunette DO [facility] Pager [phone]  If 7PM-7AM, please contact night-coverage [email or web address] Password [email or web address]  08/31/2018, 2:27 PM

## 2018-08-31 NOTE — ED Notes (Signed)
hospitalist at bedside

## 2018-08-31 NOTE — ED Notes (Signed)
resp in for treatment 

## 2018-08-31 NOTE — ED Notes (Signed)
Call to [facility]  Pt covid neg   they will come and given cont neb

## 2018-08-31 NOTE — ED Notes (Signed)
Pt awaiting bed assignment 

## 2018-08-31 NOTE — ED Notes (Signed)
Call from pt spouse  Update given

## 2018-08-31 NOTE — ED Notes (Signed)
Call to pharm for meds  meds have not been verified   Pharm notified that covid test was neg and tech should be able to speak w pt

## 2018-08-31 NOTE — ED Provider Notes (Signed)
[facility]ERGENCY DEPARTMENT Provider Note   CSN: [id number] Arrival date & time: 08/31/18  1146     History   Chief Complaint Chief Complaint  Patient presents with  . Shortness of Breath    HPI Lucia GaskinsJanice Biswell is a 73 y.o. female.     HPI Pt was seen at 1155. Per EMS and pt report: c/o gradual onset and worsening of persistent SOB since yesterday. Has been associated with subjective home fevers/chills. Pt also c/o an episode of nausea/vomiting, and sore throat.  EMS states pt's O2 was "86% R/A" on their arrival to scene. EMS applied O2 N/C with increasing O2 Sats to "96%." Pt endorses hx of CHF, with weight gain of 2# in the past few days. Pt states she took a lasix pill yesterday due to the weight gain. Pt states she does not take the lasix regularly. Denies known COVID exposure. Denies CP/palpitations, no cough, no abd pain, no diarrhea, no black or blood in stools or emesis, no rash, no back pain.   Past Medical History:  Diagnosis Date  . Atrial flutter (HCC)   . Diastolic CHF (HCC)   . Episodic atrial fibrillation (HCC)    Converted to NSR recent hospitalization 01/2013 on Dilt  . Fatigue   . Hypercholesterolemia   . Hypertension   . Obesity   . Right bundle branch block    Appears to be chronic  . Vertigo     Patient Active Problem List   Diagnosis Date Noted  . Atrial fibrillation with RVR (HCC) 08/12/2015  . Non compliance with medical treatment 08/12/2015  . HTN (hypertension) 08/12/2015  . Acute respiratory failure with hypoxia (HCC) 08/12/2015  . Acute on chronic diastolic (congestive) heart failure (HCC) 08/12/2015  . Pulmonary edema cardiac cause (HCC) 08/12/2015  . Chronic diastolic CHF (congestive heart failure) (HCC) 07/30/2015  . CHF (congestive heart failure) (HCC) 11/17/2014  . Right bundle branch block 07/19/2014  . Atrial flutter with rapid ventricular response (HCC) 01/20/2013  . Elevated blood pressure 01/20/2013  . Depression  01/20/2013    Past Surgical History:  Procedure Laterality Date  . carpel tunnel surgery    . TONSILLECTOMY    . WRIST FRACTURE SURGERY       OB History    Gravida  3   Para  3   Term  3   Preterm      AB      Living  2     SAB      TAB      Ectopic      Multiple      Live Births               Home Medications    Prior to Admission medications   Medication Sig Start Date End Date Taking? Authorizing Provider  Biotin 1308610000 MCG TABS Take 5,000 mcg by mouth daily.    [provider]  carvedilol (COREG) 3.125 MG tablet Take 3.125 mg by mouth daily.    [provider]  esomeprazole (NEXIUM) 20 MG capsule Take 20 mg by mouth as needed. Reported on 09/07/2015    [provider]  furosemide (LASIX) 40 MG tablet Take 1 tablet (40 mg total) by mouth daily. 12/14/15 03/13/16  Marinus Mawaylor, Gregg W, MD  furosemide (LASIX) 40 MG tablet Take 40 mg by mouth as needed.    [provider]  Levothyroxine Sodium 112 MCG CAPS Take 12 mcg by mouth daily before breakfast. 1/2 tab daily.  [provider]  MAGNESIUM-ZINC PO Take by mouth.    [provider]  NON FORMULARY daily. Sea Kelp 150mg     [provider]  potassium chloride (K-DUR) 10 MEQ tablet Take 1 tablet (10 mEq total) by mouth daily. 09/18/15   Marinus Maw, MD  vitamin B-12 (CYANOCOBALAMIN) 1000 MCG tablet Take 1,000 mcg by mouth daily.    [provider]    Family History Family History  Problem Relation Age of Onset  . Asthma Mother   . Hypertension Mother   . COPD Father   . Cancer Other   . Cardiomyopathy Brother        Congenital with need for heart transplant. Died at 12.    Social History Social History   Tobacco Use  . Smoking status: Never Smoker  . Smokeless tobacco: Never Used  Substance Use Topics  . Alcohol use: No    Alcohol/week: 4.0 - 5.0 standard drinks    Types: 4 - 5 Standard drinks or equivalent per week     Comment: rarely  . Drug use: No     Allergies   Bystolic [nebivolol hcl], Metoprolol, Asa [aspirin], Diltiazem, Edarbi [azilsartan], Lasix [furosemide], and Zantac [ranitidine hcl]   Review of Systems Review of Systems ROS: Statement: All systems negative except as marked or noted in the HPI; Constitutional: +fever and chills. ; ; Eyes: Negative for eye pain, redness and discharge. ; ; ENMT: Negative for ear pain, hoarseness, nasal congestion, sinus pressure and +sore throat. ; ; Cardiovascular: +SOB, weight gain. Negative for chest pain, palpitations, diaphoresis, and peripheral edema. ; ; Respiratory: Negative for cough, wheezing and stridor. ; ; Gastrointestinal: +N/V. Negative for diarrhea, abdominal pain, blood in stool, hematemesis, jaundice and rectal bleeding. . ; ; Genitourinary: Negative for dysuria, flank pain and hematuria. ; ; Musculoskeletal: Negative for back pain and neck pain. Negative for swelling and trauma.; ; Skin: Negative for pruritus, rash, abrasions, blisters, bruising and skin lesion.; ; Neuro: Negative for headache, lightheadedness and neck stiffness. Negative for weakness, altered level of consciousness, altered mental status, extremity weakness, paresthesias, involuntary movement, seizure and syncope.       Physical Exam Updated Vital Signs BP (!) 165/86   Pulse 90   Temp (!) 101.9 F (38.8 C) (Oral)   Resp (!) 22   Ht 5\' 7"  (1.702 m)   Wt 95.7 kg   SpO2 94%   BMI 33.05 kg/m    Patient Vitals for the past 24 hrs:  BP Temp Temp src Pulse Resp SpO2 Height Weight  08/31/18 1245 - - - 90 (!) 22 94 % - -  08/31/18 1230 - - - 95 (!) 27 96 % - -  08/31/18 1200 (!) 165/86 - - 94 (!) 33 91 % - -  08/31/18 1159 - - - - (!) 24 - - -  08/31/18 1155 - - - - - - 5\' 7"  (1.702 m) 95.7 kg  08/31/18 1154 (!) 147/109 (!) 101.9 F (38.8 C) Oral 95 - 93 % - -     Physical Exam 1200: Physical examination:  Nursing notes reviewed; Vital signs and O2 SAT reviewed;   Constitutional: Well developed, Well nourished, In no acute distress; Head:  Normocephalic, atraumatic; Eyes: EOMI, PERRL, No scleral icterus; ENMT: Mouth and pharynx normal, Mucous membranes dry. +edemetous nasal turbinates bilat with clear rhinorrhea. Mouth and pharynx without lesions. No tonsillar exudates. No intra-oral edema. No submandibular or sublingual edema. No hoarse voice, no drooling, no  stridor. No pain with manipulation of larynx. No trismus. ; Neck: Supple, Full range of motion, No lymphadenopathy; Cardiovascular: Tachycardic rate and rhythm, No gallop; Respiratory: Breath sounds diminished & equal bilaterally, No audible wheezes. Tachypneic, Speaking short sentences with ease, sitting upright.; Chest: Nontender, Movement normal; Abdomen: Soft, Nontender, Nondistended, Normal bowel sounds; Genitourinary: No CVA tenderness; Extremities: Peripheral pulses normal, No tenderness, No edema, No calf edema or asymmetry.; Neuro: AA&Ox3, Major CN grossly intact.  Speech clear. No gross focal motor deficits in extremities.; Skin: Color normal, Warm, Dry.     ED Treatments / Results  Labs (all labs ordered are listed, but only abnormal results are displayed)   EKG EKG Interpretation  Date/Time:  Friday August 31 2018 12:00:38 EDT Ventricular Rate:  94 PR Interval:    QRS Duration: 132 QT Interval:  410 QTC Calculation: 513 R Axis:   -30 Text Interpretation:  Sinus rhythm Right bundle branch block Nonspecific T abnormalities, lateral leads Baseline wander When compared with ECG of 08/12/2015 Normal sinus rhythm has replaced Atrial fibrillation Confirmed by Francine Graven [phone]) on 08/31/2018 12:33:31 PM   Radiology   Procedures Procedures (including critical care time)  Medications Ordered in ED Medications  albuterol (VENTOLIN HFA) 108 (90 Base) MCG/ACT inhaler 8 puff (has no administration in time range)  acetaminophen (TYLENOL) tablet 650 mg (650 mg Oral Given 08/31/18 1217)      Initial Impression / Assessment and Plan / ED Course  I have reviewed the triage vital signs and the nursing notes.  Pertinent labs & imaging results that were available during my care of the patient were reviewed by me and considered in my medical decision making (see chart for details).     MDM Reviewed: nursing note, previous chart and vitals Reviewed previous: labs and ECG Interpretation: labs, ECG and x-ray Total time providing critical care: 30-74 minutes. This excludes time spent performing separately reportable procedures and services. Consults: admitting MD   CRITICAL CARE Performed by: Francine Graven Total critical care time: 45 minutes Critical care time was exclusive of separately billable procedures and treating other patients. Critical care was necessary to treat or prevent imminent or life-threatening deterioration. Critical care was time spent personally by me on the following activities: development of treatment plan with patient and/or surrogate as well as nursing, discussions with consultants, evaluation of patient's response to treatment, examination of patient, obtaining history from patient or surrogate, ordering and performing treatments and interventions, ordering and review of laboratory studies, ordering and review of radiographic studies, pulse oximetry and re-evaluation of patient's condition.   Results for orders placed or performed during the hospital encounter of 08/31/18  SARS Coronavirus 2 (CEPHEID - Performed in [place] hospital lab), [facility] Order   Specimen: Nasopharyngeal Swab  Result Value Ref Range   SARS Coronavirus 2 NEGATIVE NEGATIVE  Group A Strep by PCR   Specimen: Nasopharyngeal Swab; Sterile Swab  Result Value Ref Range   Group A Strep by PCR NOT DETECTED NOT DETECTED  Culture, blood (routine x 2)   Specimen: BLOOD RIGHT ARM  Result Value Ref Range   Specimen Description BLOOD RIGHT ARM    Special Requests      BOTTLES DRAWN  AEROBIC AND ANAEROBIC Blood Culture adequate volume Performed at [facility], [address] [place], [place] [address]    Culture PENDING    Report Status PENDING   Culture, blood (routine x 2)   Specimen: BLOOD LEFT HAND  Result Value Ref Range   Specimen  Description BLOOD LEFT HAND    Special Requests      BOTTLES DRAWN AEROBIC AND ANAEROBIC Blood Culture results may not be optimal due to an excessive volume of blood received in culture bottles Performed at [facility], [address] [place], [place] [address]    Culture PENDING    Report Status PENDING   Comprehensive metabolic panel  Result Value Ref Range   Sodium 138 135 - 145 mmol/L   Potassium 3.6 3.5 - 5.1 mmol/L   Chloride 101 98 - 111 mmol/L   CO2 24 22 - 32 mmol/L   Glucose, Bld 121 (H) 70 - 99 mg/dL   BUN 12 8 - 23 mg/dL   Creatinine, Ser 6.040.72 0.44 - 1.00 mg/dL   Calcium 8.7 (L) 8.9 - 10.3 mg/dL   Total Protein 8.1 6.5 - 8.1 g/dL   Albumin 4.4 3.5 - 5.0 g/dL   AST 31 15 - 41 U/L   ALT 27 0 - 44 U/L   Alkaline Phosphatase 95 38 - 126 U/L   Total Bilirubin 1.5 (H) 0.3 - 1.2 mg/dL   GFR calc non Af Amer >60 >60 mL/min   GFR calc Af Amer >60 >60 mL/min   Anion gap 13 5 - 15  Lactic acid, plasma  Result Value Ref Range   Lactic Acid, Venous 1.3 0.5 - 1.9 mmol/L  Lactic acid, plasma  Result Value Ref Range   Lactic Acid, Venous 1.1 0.5 - 1.9 mmol/L  CBC with Differential  Result Value Ref Range   WBC 14.2 (H) 4.0 - 10.5 K/uL   RBC 4.57 3.87 - 5.11 MIL/uL   Hemoglobin 13.7 12.0 - 15.0 g/dL   HCT 54.040.3 98.136.0 - 19.146.0 %   MCV 88.2 80.0 - 100.0 fL   MCH 30.0 26.0 - 34.0 pg   MCHC 34.0 30.0 - 36.0 g/dL   RDW 47.813.8 29.511.5 - 62.115.5 %   Platelets 186 150 - 400 K/uL   nRBC 0.0 0.0 - 0.2 %   Neutrophils Relative % 88 %   Neutro Abs 12.6 (H) 1.7 - 7.7 K/uL   Lymphocytes Relative 4 %   Lymphs Abs 0.5 (L) 0.7 - 4.0 K/uL   Monocytes Relative 5 %   Monocytes Absolute 0.7 0.1 - 1.0 K/uL   Eosinophils Relative 2 %    Eosinophils Absolute 0.3 0.0 - 0.5 K/uL   Basophils Relative 0 %   Basophils Absolute 0.1 0.0 - 0.1 K/uL   Immature Granulocytes 1 %   Abs Immature Granulocytes 0.08 (H) 0.00 - 0.07 K/uL  Brain natriuretic peptide  Result Value Ref Range   B Natriuretic Peptide 303.0 (H) 0.0 - 100.0 pg/mL  Troponin I (High Sensitivity)  Result Value Ref Range   Troponin I (High Sensitivity) 10.00 <18 ng/L  Troponin I (High Sensitivity)  Result Value Ref Range   Troponin I (High Sensitivity) 12.00 <18 ng/L   Dg Chest Port 1 View Result Date: 08/31/2018 CLINICAL DATA:  Fever and shortness of breath since last night EXAM: PORTABLE CHEST 1 VIEW COMPARISON:  December 25, 2015 FINDINGS: The heart size and mediastinal contours are within normal limits. Both lungs are clear. The visualized skeletal structures are unremarkable. IMPRESSION: No active cardiopulmonary disease. Electronically Signed   By: Sherian ReinWei-Chen  Lin M.D.   On: 08/31/2018 13:08    Lucia GaskinsJanice Vieyra was evaluated in Emergency Department on 08/31/2018 for the symptoms described in the history of present illness. She was evaluated in the context of the global  COVID-19 pandemic, which necessitated consideration that the patient might be at risk for infection with the SARS-CoV-2 virus that causes COVID-19. Institutional protocols and algorithms that pertain to the evaluation of patients at risk for COVID-19 are in a state of rapid change based on information released by regulatory bodies including the CDC and federal and state organizations. These policies and algorithms were followed during the patient's care in the ED.   1405:  APAP given for fever with improvement. BP stable. Pt's O2 Sats on N/C 91-93%. Albuterol MDI 8 puffs initially given as COVID testing pending. CXR without acute process. BNP mildly elevated. Troponin x2 negative. COVID negative. IV lasix 40mg  and hour long neb started for continued new O2 requirement. Will admit. T/C returned from [facility]  Dr. Sherryll BurgerShah, case discussed, including:  HPI, pertinent PM/SHx, VS/PE, dx testing, ED course and treatment:  Agreeable to admit.         Final Clinical Impressions(s) / ED Diagnoses   Final diagnoses:  None    ED Discharge Orders    None       Samuel JesterMcManus, Kjirsten Bloodgood, DO 09/04/18 1602

## 2018-09-01 ENCOUNTER — Observation Stay (HOSPITAL_BASED_OUTPATIENT_CLINIC_OR_DEPARTMENT_OTHER): Payer: Medicare HMO

## 2018-09-01 DIAGNOSIS — I5031 Acute diastolic (congestive) heart failure: Secondary | ICD-10-CM

## 2018-09-01 DIAGNOSIS — I5033 Acute on chronic diastolic (congestive) heart failure: Secondary | ICD-10-CM | POA: Diagnosis not present

## 2018-09-01 DIAGNOSIS — J9601 Acute respiratory failure with hypoxia: Secondary | ICD-10-CM | POA: Diagnosis not present

## 2018-09-01 LAB — BASIC METABOLIC PANEL
Anion gap: 10 (ref 5–15)
BUN: 17 mg/dL (ref 8–23)
CO2: 29 mmol/L (ref 22–32)
Calcium: 8.2 mg/dL — ABNORMAL LOW (ref 8.9–10.3)
Chloride: 100 mmol/L (ref 98–111)
Creatinine, Ser: 0.82 mg/dL (ref 0.44–1.00)
GFR calc Af Amer: >60 mL/min (ref >60)
GFR calc non Af Amer: >60 mL/min (ref >60)
Glucose, Bld: 113 mg/dL — ABNORMAL HIGH (ref 70–99)
Potassium: 3.4 mmol/L — ABNORMAL LOW (ref 3.5–5.1)
Sodium: 139 mmol/L (ref 135–145)

## 2018-09-01 LAB — CBC
HCT: 36.7 % (ref 36.0–46.0)
Hemoglobin: 12 g/dL (ref 12.0–15.0)
MCH: 29.9 pg (ref 26.0–34.0)
MCHC: 32.7 g/dL (ref 30.0–36.0)
MCV: 91.5 fL (ref 80.0–100.0)
Platelets: 162 10*3/uL (ref 150–400)
RBC: 4.01 MIL/uL (ref 3.87–5.11)
RDW: 13.9 % (ref 11.5–15.5)
WBC: 8.8 10*3/uL (ref 4.0–10.5)
nRBC: 0 % (ref 0.0–0.2)

## 2018-09-01 LAB — ECHOCARDIOGRAM COMPLETE
Height: 68 in
Weight: 3368.63 oz

## 2018-09-01 LAB — MAGNESIUM: Magnesium: 1.8 mg/dL (ref 1.7–2.4)

## 2018-09-01 MED ORDER — POTASSIUM CHLORIDE CRYS ER 20 MEQ PO TBCR
40.0000 meq | EXTENDED_RELEASE_TABLET | Freq: Once | ORAL | Status: AC
  Administered 2018-09-01: 40 meq via ORAL
  Filled 2018-09-01: qty 2

## 2018-09-01 NOTE — Progress Notes (Addendum)
SATURATION QUALIFICATIONS: (This note is used to comply with regulatory documentation for home oxygen)  Patient Saturations on Room Air at Rest = 93%  Patient Saturations on Room Air while Ambulating = 86%  Patient Saturations on 1 Liter of oxygen while Ambulating = 93%  Please briefly explain why patient needs home oxygen: SOB with ambulation on room air

## 2018-09-01 NOTE — Discharge Summary (Addendum)
Physician Discharge Summary  Lindsay Whitehead YQM:[id number] DOB: May 23, 1945 DOA: 08/31/2018  PCP: Dixie Dials, MD  Admit date: 08/31/2018  Discharge date: 09/01/2018  Admitted From:Home  Disposition:  Home  Recommendations for Outpatient Follow-up:  1. Follow up with PCP in 1-2 weeks 2. Continue monitor daily weights and restrict fluid intake.  Patient has been educated on this previously 3. Continue take Lasix as needed at home 4. 2D echocardiogram performed with LVEF 50-55% and no other acute abnormalities otherwise noted 5. Patient does appear to require 1 L nasal cannula oxygen which will be prescribed on discharge.  Home Health: None  Equipment/Devices: Home nasal cannula 1 L oxygen.  Discharge Condition: Stable  CODE STATUS: Full  Diet recommendation: Heart Healthy  Brief/Interim Summary: Per HPI: Lindsay Whitehead is a 73 y.o. female with medical history significant for paroxysmal atrial fibrillation/flutter, diastolic CHF, hypertension, dyslipidemia, obesity, and GERD who presented to the ED emergency department via EMS after she was noted to have some shortness of breath fevers and chills last night.  She was noted to have oxygen saturation of 86% on room air upon EMS arrival which improved rapidly with administration of nasal cannula oxygen.  Patient takes Lasix as needed at home for symptoms and weight gain and did notice a 2 pound weight gain over the last 1 to 2 days.  She denies any PND, orthopnea, chest pain, or lower extremity edema.  Patient was admitted with some mild acute hypoxemic respiratory failure secondary to acute on chronic diastolic CHF.  She has diuresed about a half a liter, but is no longer symptomatic and is not requiring any further oxygen.  Her symptoms were quite mild even on admission, but she was observed overnight.  She has had 2D echocardiogram performed while here with LVEF 50-55% and no other acute abnormalities noted.  She is to remain on  usual medications including Lasix as needed and monitors her weights daily at home and knows to restrict her fluid intake.  No other acute events noted during the course of this admission.  Discharge Diagnoses:  Active Problems:   Acute hypoxemic respiratory failure (HCC)  Principal discharge diagnosis: Mild acute hypoxemic respiratory failure in the setting of acute on chronic diastolic CHF.  Discharge Instructions  Discharge Instructions    Diet - low sodium heart healthy   Complete by: As directed    Increase activity slowly   Complete by: As directed      Allergies as of 09/01/2018      Reactions   Bystolic [nebivolol Hcl] Other (See Comments)   Extreme chest pains/ lowering of blood pressure causing dizziness, falling   Metoprolol Other (See Comments)   Extreme drop in blood pressure, vertigo resulted   Asa [aspirin]    Bleeding gums   Diltiazem    bleeding gums, stomach ache,weekness    Edarbi [azilsartan] Hives   Lasix [furosemide] Other (See Comments)   Cramping in thigh area    Zantac [ranitidine Hcl]       Medication List    TAKE these medications   Biotin 10000 MCG Tabs Take 5,000 mcg by mouth daily.   carvedilol 3.125 MG tablet Commonly known as: COREG Take 3.125 mg by mouth daily.   esomeprazole 20 MG capsule Commonly known as: NEXIUM Take 20 mg by mouth as needed. Reported on 09/07/2015   furosemide 40 MG tablet Commonly known as: LASIX Take 40 mg by mouth as needed. What changed: Another medication with the same name was removed. Continue  taking this medication, and follow the directions you see here.   levothyroxine 25 MCG tablet Commonly known as: SYNTHROID Take 25 mcg by mouth daily before breakfast.   MAGNESIUM-ZINC PO Take by mouth.   NON FORMULARY daily. [place] 150mg    potassium chloride 10 MEQ tablet Commonly known as: K-DUR Take 1 tablet (10 mEq total) by mouth daily.   vitamin B-12 1000 MCG tablet Commonly known as:  CYANOCOBALAMIN Take 1,000 mcg by mouth daily.      Follow-up Information    Revonda Humphrey, MD Follow up in 1 week(s).   Specialty: Internal Medicine Contact information: [address] [place] [address] [phone]          Allergies  Allergen Reactions  . Bystolic [Nebivolol Hcl] Other (See Comments)    Extreme chest pains/ lowering of blood pressure causing dizziness, falling  . Metoprolol Other (See Comments)    Extreme drop in blood pressure, vertigo resulted  . Asa [Aspirin]     Bleeding gums  . Diltiazem     bleeding gums, stomach ache,weekness   . Edarbi [Azilsartan] Hives  . Lasix [Furosemide] Other (See Comments)    Cramping in thigh area   . Zantac [Ranitidine Hcl]     Consultations:  None   Procedures/Studies: Dg Chest Port 1 View  Result Date: 08/31/2018 CLINICAL DATA:  Fever and shortness of breath since last night EXAM: PORTABLE CHEST 1 VIEW COMPARISON:  December 25, 2015 FINDINGS: The heart size and mediastinal contours are within normal limits. Both lungs are clear. The visualized skeletal structures are unremarkable. IMPRESSION: No active cardiopulmonary disease. Electronically Signed   By: Sherian Rein M.D.   On: 08/31/2018 13:08     Discharge Exam: Vitals:   09/01/18 0510 09/01/18 0602  BP: 124/60 120/68  Pulse: 64 67  Resp: 18 17  Temp: 98.2 F (36.8 C) 98.1 F (36.7 C)  SpO2: 95% 94%   Vitals:   08/31/18 2116 09/01/18 0500 09/01/18 0510 09/01/18 0602  BP: (!) 109/55  124/60 120/68  Pulse: 73  64 67  Resp: 18  18 17   Temp: (!) 100.5 F (38.1 C)  98.2 F (36.8 C) 98.1 F (36.7 C)  TempSrc: Oral  Oral Oral  SpO2: 97%  95% 94%  Weight:  95.5 kg    Height:        General: Pt is alert, awake, not in acute distress Cardiovascular: RRR, S1/S2 +, no rubs, no gallops Respiratory: CTA bilaterally, no wheezing, no rhonchi, currently on room air Abdominal: Soft, NT, ND, bowel sounds + Extremities: no edema, no cyanosis    The  results of significant diagnostics from this hospitalization (including imaging, microbiology, ancillary and laboratory) are listed below for reference.     Microbiology: Recent Results (from the past 240 hour(s))  SARS Coronavirus 2 (CEPHEID - Performed in [facility] hospital lab), Hosp Order     Status: None   Collection Time: 08/31/18 12:10 PM   Specimen: Nasopharyngeal Swab  Result Value Ref Range Status   SARS Coronavirus 2 NEGATIVE NEGATIVE Final    Comment: (NOTE) If result is NEGATIVE SARS-CoV-2 target nucleic acids are NOT DETECTED. The SARS-CoV-2 RNA is generally detectable in upper and lower  respiratory specimens during the acute phase of infection. The lowest  concentration of SARS-CoV-2 viral copies this assay can detect is 250  copies / mL. A negative result does not preclude SARS-CoV-2 infection  and should not be used as the sole basis for treatment or  other  patient management decisions.  A negative result may occur with  improper specimen collection / handling, submission of specimen other  than nasopharyngeal swab, presence of viral mutation(s) within the  areas targeted by this assay, and inadequate number of viral copies  (<250 copies / mL). A negative result must be combined with clinical  observations, patient history, and epidemiological information. If result is POSITIVE SARS-CoV-2 target nucleic acids are DETECTED. The SARS-CoV-2 RNA is generally detectable in upper and lower  respiratory specimens dur ing the acute phase of infection.  Positive  results are indicative of active infection with SARS-CoV-2.  Clinical  correlation with patient history and other diagnostic information is  necessary to determine patient infection status.  Positive results do  not rule out bacterial infection or co-infection with other viruses. If result is PRESUMPTIVE POSTIVE SARS-CoV-2 nucleic acids MAY BE PRESENT.   A presumptive positive result was obtained on the  submitted specimen  and confirmed on repeat testing.  While 2019 novel coronavirus  (SARS-CoV-2) nucleic acids may be present in the submitted sample  additional confirmatory testing may be necessary for epidemiological  and / or clinical management purposes  to differentiate between  SARS-CoV-2 and other Sarbecovirus currently known to infect humans.  If clinically indicated additional testing with an alternate test  methodology [phone]) is advised. The SARS-CoV-2 RNA is generally  detectable in upper and lower respiratory sp ecimens during the acute  phase of infection. The expected result is Negative. Fact Sheet for Patients:  BoilerBrush.com.cyhttps://www.fda.gov/media/136312/download Fact Sheet for Healthcare Providers: https://pope.com/https://www.fda.gov/media/136313/download This test is not yet approved or cleared by the [place] FDA and has been authorized for detection and/or diagnosis of SARS-CoV-2 by FDA under an Emergency Use Authorization (EUA).  This EUA will remain in effect (meaning this test can be used) for the duration of the COVID-19 declaration under Section 564(b)(1) of the Act, 21 U.S.C. section 360bbb-3(b)(1), unless the authorization is terminated or revoked sooner. Performed at [facility], [address] [place], [place] [address]   Group A Strep by PCR     Status: None   Collection Time: 08/31/18 12:10 PM   Specimen: Nasopharyngeal Swab; Sterile Swab  Result Value Ref Range Status   Group A Strep by PCR NOT DETECTED NOT DETECTED Final    Comment: Performed at [facility], [address] [place], [place] [address]  Culture, blood (routine x 2)     Status: None (Preliminary result)   Collection Time: 08/31/18 12:19 PM   Specimen: BLOOD RIGHT ARM  Result Value Ref Range Status   Specimen Description BLOOD RIGHT ARM  Final   Special Requests   Final    BOTTLES DRAWN AEROBIC AND ANAEROBIC Blood Culture adequate volume   Culture   Final    NO GROWTH < 24 HOURS Performed at [facility], [address] [place], [place] [address]    Report Status PENDING  Incomplete  Culture, blood (routine x 2)     Status: None (Preliminary result)   Collection Time: 08/31/18 12:31 PM   Specimen: BLOOD LEFT HAND  Result Value Ref Range Status   Specimen Description BLOOD LEFT HAND  Final   Special Requests   Final    BOTTLES DRAWN AEROBIC AND ANAEROBIC Blood Culture results may not be optimal due to an excessive volume of blood received in culture bottles   Culture   Final    NO GROWTH < 24 HOURS Performed at [facility], [address]  [place], [place] [address]    Report Status PENDING  Incomplete     Labs: BNP (last 3 results) Recent Labs    08/31/18 1202  BNP 303.0*   Basic Metabolic Panel: Recent Labs  Lab 08/31/18 1201 09/01/18 0514  NA 138 139  K 3.6 3.4*  CL 101 100  CO2 24 29  GLUCOSE 121* 113*  BUN 12 17  CREATININE 0.72 0.82  CALCIUM 8.7* 8.2*  MG  --  1.8   Liver Function Tests: Recent Labs  Lab 08/31/18 1201  AST 31  ALT 27  ALKPHOS 95  BILITOT 1.5*  PROT 8.1  ALBUMIN 4.4   No results for input(s): LIPASE, AMYLASE in the last 168 hours. No results for input(s): AMMONIA in the last 168 hours. CBC: Recent Labs  Lab 08/31/18 1201 09/01/18 0514  WBC 14.2* 8.8  NEUTROABS 12.6*  --   HGB 13.7 12.0  HCT 40.3 36.7  MCV 88.2 91.5  PLT 186 162   Cardiac Enzymes: No results for input(s): CKTOTAL, CKMB, CKMBINDEX, TROPONINI in the last 168 hours. BNP: Invalid input(s): POCBNP CBG: No results for input(s): GLUCAP in the last 168 hours. D-Dimer No results for input(s): DDIMER in the last 72 hours. Hgb A1c No results for input(s): HGBA1C in the last 72 hours. Lipid Profile No results for input(s): CHOL, HDL, LDLCALC, TRIG, CHOLHDL, LDLDIRECT in the last 72 hours. Thyroid function studies No results for input(s): TSH, T4TOTAL, T3FREE, THYROIDAB in the last 72 hours.  Invalid input(s): FREET3 Anemia work up No results for input(s):  VITAMINB12, FOLATE, FERRITIN, TIBC, IRON, RETICCTPCT in the last 72 hours. Urinalysis    Component Value Date/Time   COLORURINE AMBER (A) 08/31/2018 1440   APPEARANCEUR HAZY (A) 08/31/2018 1440   LABSPEC 1.027 08/31/2018 1440   PHURINE 5.0 08/31/2018 1440   GLUCOSEU NEGATIVE 08/31/2018 1440   HGBUR MODERATE (A) 08/31/2018 1440   BILIRUBINUR NEGATIVE 08/31/2018 1440   KETONESUR 5 (A) 08/31/2018 1440   PROTEINUR NEGATIVE 08/31/2018 1440   UROBILINOGEN 0.2 07/18/2014 1158   NITRITE NEGATIVE 08/31/2018 1440   LEUKOCYTESUR NEGATIVE 08/31/2018 1440   Sepsis Labs Invalid input(s): PROCALCITONIN,  WBC,  LACTICIDVEN Microbiology Recent Results (from the past 240 hour(s))  SARS Coronavirus 2 (CEPHEID - Performed in [facility] hospital lab), Hosp Order     Status: None   Collection Time: 08/31/18 12:10 PM   Specimen: Nasopharyngeal Swab  Result Value Ref Range Status   SARS Coronavirus 2 NEGATIVE NEGATIVE Final    Comment: (NOTE) If result is NEGATIVE SARS-CoV-2 target nucleic acids are NOT DETECTED. The SARS-CoV-2 RNA is generally detectable in upper and lower  respiratory specimens during the acute phase of infection. The lowest  concentration of SARS-CoV-2 viral copies this assay can detect is 250  copies / mL. A negative result does not preclude SARS-CoV-2 infection  and should not be used as the sole basis for treatment or other  patient management decisions.  A negative result may occur with  improper specimen collection / handling, submission of specimen other  than nasopharyngeal swab, presence of viral mutation(s) within the  areas targeted by this assay, and inadequate number of viral copies  (<250 copies / mL). A negative result must be combined with clinical  observations, patient history, and epidemiological information. If result is POSITIVE SARS-CoV-2 target nucleic acids are DETECTED. The SARS-CoV-2 RNA is generally detectable in upper and lower  respiratory specimens  dur ing the acute phase of infection.  Positive  results  are indicative of active infection with SARS-CoV-2.  Clinical  correlation with patient history and other diagnostic information is  necessary to determine patient infection status.  Positive results do  not rule out bacterial infection or co-infection with other viruses. If result is PRESUMPTIVE POSTIVE SARS-CoV-2 nucleic acids MAY BE PRESENT.   A presumptive positive result was obtained on the submitted specimen  and confirmed on repeat testing.  While 2019 novel coronavirus  (SARS-CoV-2) nucleic acids may be present in the submitted sample  additional confirmatory testing may be necessary for epidemiological  and / or clinical management purposes  to differentiate between  SARS-CoV-2 and other Sarbecovirus currently known to infect humans.  If clinically indicated additional testing with an alternate test  methodology [phone]([id number]) is advised. The SARS-CoV-2 RNA is generally  detectable in upper and lower respiratory sp ecimens during the acute  phase of infection. The expected result is Negative. Fact Sheet for Patients:  BoilerBrush.com.cyhttps://www.fda.gov/media/136312/download Fact Sheet for Healthcare Providers: https://pope.com/https://www.fda.gov/media/136313/download This test is not yet approved or cleared by the [place] FDA and has been authorized for detection and/or diagnosis of SARS-CoV-2 by FDA under an Emergency Use Authorization (EUA).  This EUA will remain in effect (meaning this test can be used) for the duration of the COVID-19 declaration under Section 564(b)(1) of the Act, 21 U.S.C. section 360bbb-3(b)(1), unless the authorization is terminated or revoked sooner. Performed at [facility], [address] [place], [place] [address]   Group A Strep by PCR     Status: None   Collection Time: 08/31/18 12:10 PM   Specimen: Nasopharyngeal Swab; Sterile Swab  Result Value Ref Range Status   Group A Strep by PCR NOT DETECTED NOT DETECTED  Final    Comment: Performed at [facility], [address] [place], [place] [address]  Culture, blood (routine x 2)     Status: None (Preliminary result)   Collection Time: 08/31/18 12:19 PM   Specimen: BLOOD RIGHT ARM  Result Value Ref Range Status   Specimen Description BLOOD RIGHT ARM  Final   Special Requests   Final    BOTTLES DRAWN AEROBIC AND ANAEROBIC Blood Culture adequate volume   Culture   Final    NO GROWTH < 24 HOURS Performed at [facility], [address] [place], [place] [address]    Report Status PENDING  Incomplete  Culture, blood (routine x 2)     Status: None (Preliminary result)   Collection Time: 08/31/18 12:31 PM   Specimen: BLOOD LEFT HAND  Result Value Ref Range Status   Specimen Description BLOOD LEFT HAND  Final   Special Requests   Final    BOTTLES DRAWN AEROBIC AND ANAEROBIC Blood Culture results may not be optimal due to an excessive volume of blood received in culture bottles   Culture   Final    NO GROWTH < 24 HOURS Performed at [facility], [address] [place], [place] [address]    Report Status PENDING  Incomplete     Time coordinating discharge: 35 minutes  SIGNED:   Erick BlinksPratik D Kortlynn Poust, DO [facility] 09/01/2018, 9:18 AM  If 7PM-7AM, please contact night-coverage [email or web address] Password [email or web address]

## 2018-09-01 NOTE — TOC Transition Note (Signed)
Transition of Care [facility]) - CM/SW Discharge Note   Patient Details  Name: Lindsay Whitehead MRN: [id number] Date of Birth: 08-18-1945  Transition of Care [facility]) CM/SW Contact:  Latanya Maudlin, RN Phone Number: 09/01/2018, 10:33 AM   Clinical Narrative: Patient plans to discharge with oxygen. Qualifying Sats are in place. Notified Lennette Bihari of [facility] for need for O2 delivery. Patient will received tanks at bedside as well as home set up.             Patient Goals and CMS Choice   CMS Medicare.gov Compare Post Acute Care list provided to:: Patient Choice offered to / list presented to : Patient  Discharge Placement                       Discharge Plan and Services                DME Arranged: Oxygen DME Agency: Other - Comment([facility] patient) Date DME Agency Contacted: 09/01/18 Time DME Agency Contacted: [phone] Representative spoke with at DME Agency: [place] Determinants of Health ([place]) Interventions     Readmission Risk Interventions No flowsheet data found.

## 2018-09-01 NOTE — Progress Notes (Signed)
IV, tele removed. D/C instructions reviewed with patient, verbalized understanding. To be picked up by husband with oxygen. Patient will be transported to private vehicle via wheelchair with oxygen. Will continue to monitor.

## 2018-09-01 NOTE — Progress Notes (Signed)
Educated patient on reasons and qualifications of home o2, CHF exacerbation and importance of fluid restriction. Verbalized understanding. Will continue to monitor.

## 2018-09-01 NOTE — Progress Notes (Signed)
2 D echo completed 

## 2018-09-02 LAB — URINE CULTURE: Culture: NO GROWTH

## 2018-09-05 LAB — CULTURE, BLOOD (ROUTINE X 2)
Culture: NO GROWTH
Culture: NO GROWTH
Special Requests: ADEQUATE

## 2018-09-13 DIAGNOSIS — Z09 Encounter for follow-up examination after completed treatment for conditions other than malignant neoplasm: Secondary | ICD-10-CM | POA: Diagnosis not present

## 2018-09-13 DIAGNOSIS — R079 Chest pain, unspecified: Secondary | ICD-10-CM | POA: Diagnosis not present

## 2018-09-13 DIAGNOSIS — Z6830 Body mass index (BMI) 30.0-30.9, adult: Secondary | ICD-10-CM | POA: Diagnosis not present

## 2018-09-13 DIAGNOSIS — R509 Fever, unspecified: Secondary | ICD-10-CM | POA: Diagnosis not present

## 2018-09-13 DIAGNOSIS — E782 Mixed hyperlipidemia: Secondary | ICD-10-CM | POA: Diagnosis not present

## 2018-09-13 DIAGNOSIS — E039 Hypothyroidism, unspecified: Secondary | ICD-10-CM | POA: Diagnosis not present

## 2018-09-13 DIAGNOSIS — L03818 Cellulitis of other sites: Secondary | ICD-10-CM | POA: Diagnosis not present

## 2018-09-13 DIAGNOSIS — I10 Essential (primary) hypertension: Secondary | ICD-10-CM | POA: Diagnosis not present

## 2018-09-13 DIAGNOSIS — Z9981 Dependence on supplemental oxygen: Secondary | ICD-10-CM | POA: Diagnosis not present

## 2018-09-13 DIAGNOSIS — R062 Wheezing: Secondary | ICD-10-CM | POA: Diagnosis not present

## 2018-09-13 DIAGNOSIS — I5020 Unspecified systolic (congestive) heart failure: Secondary | ICD-10-CM | POA: Diagnosis not present

## 2018-09-13 DIAGNOSIS — R0602 Shortness of breath: Secondary | ICD-10-CM | POA: Diagnosis not present

## 2018-09-20 DIAGNOSIS — G4733 Obstructive sleep apnea (adult) (pediatric): Secondary | ICD-10-CM | POA: Diagnosis not present

## 2018-10-02 DIAGNOSIS — E782 Mixed hyperlipidemia: Secondary | ICD-10-CM | POA: Diagnosis not present

## 2018-10-02 DIAGNOSIS — I10 Essential (primary) hypertension: Secondary | ICD-10-CM | POA: Diagnosis not present

## 2018-10-02 DIAGNOSIS — R079 Chest pain, unspecified: Secondary | ICD-10-CM | POA: Diagnosis not present

## 2018-10-02 DIAGNOSIS — L03818 Cellulitis of other sites: Secondary | ICD-10-CM | POA: Diagnosis not present

## 2018-10-02 DIAGNOSIS — I5033 Acute on chronic diastolic (congestive) heart failure: Secondary | ICD-10-CM | POA: Diagnosis not present

## 2018-10-02 DIAGNOSIS — I5020 Unspecified systolic (congestive) heart failure: Secondary | ICD-10-CM | POA: Diagnosis not present

## 2018-10-02 DIAGNOSIS — R0602 Shortness of breath: Secondary | ICD-10-CM | POA: Diagnosis not present

## 2018-10-02 DIAGNOSIS — Z09 Encounter for follow-up examination after completed treatment for conditions other than malignant neoplasm: Secondary | ICD-10-CM | POA: Diagnosis not present

## 2018-10-02 DIAGNOSIS — E039 Hypothyroidism, unspecified: Secondary | ICD-10-CM | POA: Diagnosis not present

## 2018-10-02 DIAGNOSIS — Z9981 Dependence on supplemental oxygen: Secondary | ICD-10-CM | POA: Diagnosis not present

## 2018-10-02 DIAGNOSIS — Z6830 Body mass index (BMI) 30.0-30.9, adult: Secondary | ICD-10-CM | POA: Diagnosis not present

## 2018-10-21 DIAGNOSIS — G4733 Obstructive sleep apnea (adult) (pediatric): Secondary | ICD-10-CM | POA: Diagnosis not present

## 2018-10-22 DIAGNOSIS — Z01812 Encounter for preprocedural laboratory examination: Secondary | ICD-10-CM | POA: Diagnosis not present

## 2018-10-22 DIAGNOSIS — R0602 Shortness of breath: Secondary | ICD-10-CM | POA: Diagnosis not present

## 2018-10-22 DIAGNOSIS — I452 Bifascicular block: Secondary | ICD-10-CM | POA: Diagnosis not present

## 2018-10-22 DIAGNOSIS — I10 Essential (primary) hypertension: Secondary | ICD-10-CM | POA: Diagnosis not present

## 2018-10-22 DIAGNOSIS — R195 Other fecal abnormalities: Secondary | ICD-10-CM | POA: Diagnosis not present

## 2018-10-22 DIAGNOSIS — R0609 Other forms of dyspnea: Secondary | ICD-10-CM | POA: Diagnosis not present

## 2018-10-22 DIAGNOSIS — R000 Tachycardia, unspecified: Secondary | ICD-10-CM | POA: Diagnosis not present

## 2018-10-22 DIAGNOSIS — Z20828 Contact with and (suspected) exposure to other viral communicable diseases: Secondary | ICD-10-CM | POA: Diagnosis not present

## 2018-11-02 DIAGNOSIS — I5033 Acute on chronic diastolic (congestive) heart failure: Secondary | ICD-10-CM | POA: Diagnosis not present

## 2018-11-06 DIAGNOSIS — I10 Essential (primary) hypertension: Secondary | ICD-10-CM | POA: Diagnosis not present

## 2018-11-06 DIAGNOSIS — E039 Hypothyroidism, unspecified: Secondary | ICD-10-CM | POA: Diagnosis not present

## 2018-11-06 DIAGNOSIS — E782 Mixed hyperlipidemia: Secondary | ICD-10-CM | POA: Diagnosis not present

## 2018-11-06 DIAGNOSIS — I5020 Unspecified systolic (congestive) heart failure: Secondary | ICD-10-CM | POA: Diagnosis not present

## 2018-11-07 ENCOUNTER — Ambulatory Visit: Payer: Commercial Managed Care - HMO | Admitting: Allergy & Immunology

## 2018-11-21 DIAGNOSIS — G4733 Obstructive sleep apnea (adult) (pediatric): Secondary | ICD-10-CM | POA: Diagnosis not present

## 2018-12-02 DIAGNOSIS — I5033 Acute on chronic diastolic (congestive) heart failure: Secondary | ICD-10-CM | POA: Diagnosis not present

## 2018-12-10 DIAGNOSIS — I509 Heart failure, unspecified: Secondary | ICD-10-CM | POA: Diagnosis not present

## 2019-01-02 DIAGNOSIS — I10 Essential (primary) hypertension: Secondary | ICD-10-CM | POA: Diagnosis not present

## 2019-01-02 DIAGNOSIS — G720 Drug-induced myopathy: Secondary | ICD-10-CM | POA: Diagnosis not present

## 2019-01-02 DIAGNOSIS — I5020 Unspecified systolic (congestive) heart failure: Secondary | ICD-10-CM | POA: Diagnosis not present

## 2019-01-02 DIAGNOSIS — E039 Hypothyroidism, unspecified: Secondary | ICD-10-CM | POA: Diagnosis not present

## 2019-01-02 DIAGNOSIS — E782 Mixed hyperlipidemia: Secondary | ICD-10-CM | POA: Diagnosis not present

## 2019-01-10 DIAGNOSIS — I509 Heart failure, unspecified: Secondary | ICD-10-CM | POA: Diagnosis not present

## 2019-01-15 DIAGNOSIS — G4733 Obstructive sleep apnea (adult) (pediatric): Secondary | ICD-10-CM | POA: Diagnosis not present

## 2019-01-21 DIAGNOSIS — M79671 Pain in right foot: Secondary | ICD-10-CM | POA: Diagnosis not present

## 2019-01-21 DIAGNOSIS — G4700 Insomnia, unspecified: Secondary | ICD-10-CM | POA: Diagnosis not present

## 2019-01-21 DIAGNOSIS — R69 Illness, unspecified: Secondary | ICD-10-CM | POA: Diagnosis not present

## 2019-01-25 DIAGNOSIS — E039 Hypothyroidism, unspecified: Secondary | ICD-10-CM | POA: Diagnosis not present

## 2019-01-25 DIAGNOSIS — G720 Drug-induced myopathy: Secondary | ICD-10-CM | POA: Diagnosis not present

## 2019-01-25 DIAGNOSIS — I10 Essential (primary) hypertension: Secondary | ICD-10-CM | POA: Diagnosis not present

## 2019-01-25 DIAGNOSIS — I110 Hypertensive heart disease with heart failure: Secondary | ICD-10-CM | POA: Diagnosis not present

## 2019-01-25 DIAGNOSIS — E782 Mixed hyperlipidemia: Secondary | ICD-10-CM | POA: Diagnosis not present

## 2019-01-25 DIAGNOSIS — I5020 Unspecified systolic (congestive) heart failure: Secondary | ICD-10-CM | POA: Diagnosis not present

## 2019-02-09 DIAGNOSIS — I509 Heart failure, unspecified: Secondary | ICD-10-CM | POA: Diagnosis not present

## 2019-02-14 DIAGNOSIS — G4733 Obstructive sleep apnea (adult) (pediatric): Secondary | ICD-10-CM | POA: Diagnosis not present

## 2019-02-20 DIAGNOSIS — I5020 Unspecified systolic (congestive) heart failure: Secondary | ICD-10-CM | POA: Diagnosis not present

## 2019-02-20 DIAGNOSIS — G720 Drug-induced myopathy: Secondary | ICD-10-CM | POA: Diagnosis not present

## 2019-02-20 DIAGNOSIS — G4700 Insomnia, unspecified: Secondary | ICD-10-CM | POA: Diagnosis not present

## 2019-02-20 DIAGNOSIS — R079 Chest pain, unspecified: Secondary | ICD-10-CM | POA: Diagnosis not present

## 2019-02-20 DIAGNOSIS — Z9981 Dependence on supplemental oxygen: Secondary | ICD-10-CM | POA: Diagnosis not present

## 2019-02-20 DIAGNOSIS — M79671 Pain in right foot: Secondary | ICD-10-CM | POA: Diagnosis not present

## 2019-02-20 DIAGNOSIS — R0602 Shortness of breath: Secondary | ICD-10-CM | POA: Diagnosis not present

## 2019-02-20 DIAGNOSIS — Z09 Encounter for follow-up examination after completed treatment for conditions other than malignant neoplasm: Secondary | ICD-10-CM | POA: Diagnosis not present

## 2019-02-20 DIAGNOSIS — Z0001 Encounter for general adult medical examination with abnormal findings: Secondary | ICD-10-CM | POA: Diagnosis not present

## 2019-02-20 DIAGNOSIS — I10 Essential (primary) hypertension: Secondary | ICD-10-CM | POA: Diagnosis not present

## 2019-02-20 DIAGNOSIS — J449 Chronic obstructive pulmonary disease, unspecified: Secondary | ICD-10-CM | POA: Diagnosis not present

## 2019-02-20 DIAGNOSIS — R69 Illness, unspecified: Secondary | ICD-10-CM | POA: Diagnosis not present

## 2019-02-20 DIAGNOSIS — L03818 Cellulitis of other sites: Secondary | ICD-10-CM | POA: Diagnosis not present

## 2019-02-20 DIAGNOSIS — E782 Mixed hyperlipidemia: Secondary | ICD-10-CM | POA: Diagnosis not present

## 2019-02-25 DIAGNOSIS — I5020 Unspecified systolic (congestive) heart failure: Secondary | ICD-10-CM | POA: Diagnosis not present

## 2019-02-25 DIAGNOSIS — I10 Essential (primary) hypertension: Secondary | ICD-10-CM | POA: Diagnosis not present

## 2019-02-25 DIAGNOSIS — G720 Drug-induced myopathy: Secondary | ICD-10-CM | POA: Diagnosis not present

## 2019-02-25 DIAGNOSIS — I110 Hypertensive heart disease with heart failure: Secondary | ICD-10-CM | POA: Diagnosis not present

## 2019-02-25 DIAGNOSIS — E039 Hypothyroidism, unspecified: Secondary | ICD-10-CM | POA: Diagnosis not present

## 2019-02-25 DIAGNOSIS — E782 Mixed hyperlipidemia: Secondary | ICD-10-CM | POA: Diagnosis not present

## 2019-03-12 DIAGNOSIS — I509 Heart failure, unspecified: Secondary | ICD-10-CM | POA: Diagnosis not present

## 2019-03-17 DIAGNOSIS — G4733 Obstructive sleep apnea (adult) (pediatric): Secondary | ICD-10-CM | POA: Diagnosis not present

## 2019-04-01 DIAGNOSIS — I480 Paroxysmal atrial fibrillation: Secondary | ICD-10-CM | POA: Diagnosis not present

## 2019-04-11 DIAGNOSIS — E039 Hypothyroidism, unspecified: Secondary | ICD-10-CM | POA: Diagnosis not present

## 2019-04-11 DIAGNOSIS — E782 Mixed hyperlipidemia: Secondary | ICD-10-CM | POA: Diagnosis not present

## 2019-04-11 DIAGNOSIS — G720 Drug-induced myopathy: Secondary | ICD-10-CM | POA: Diagnosis not present

## 2019-04-11 DIAGNOSIS — I10 Essential (primary) hypertension: Secondary | ICD-10-CM | POA: Diagnosis not present

## 2019-04-11 DIAGNOSIS — I5020 Unspecified systolic (congestive) heart failure: Secondary | ICD-10-CM | POA: Diagnosis not present

## 2019-04-11 DIAGNOSIS — I110 Hypertensive heart disease with heart failure: Secondary | ICD-10-CM | POA: Diagnosis not present

## 2019-04-12 DIAGNOSIS — I509 Heart failure, unspecified: Secondary | ICD-10-CM | POA: Diagnosis not present

## 2019-04-16 DIAGNOSIS — I499 Cardiac arrhythmia, unspecified: Secondary | ICD-10-CM | POA: Diagnosis not present

## 2019-05-07 DIAGNOSIS — I110 Hypertensive heart disease with heart failure: Secondary | ICD-10-CM | POA: Diagnosis not present

## 2019-05-07 DIAGNOSIS — E039 Hypothyroidism, unspecified: Secondary | ICD-10-CM | POA: Diagnosis not present

## 2019-05-07 DIAGNOSIS — I10 Essential (primary) hypertension: Secondary | ICD-10-CM | POA: Diagnosis not present

## 2019-05-07 DIAGNOSIS — G720 Drug-induced myopathy: Secondary | ICD-10-CM | POA: Diagnosis not present

## 2019-05-07 DIAGNOSIS — E782 Mixed hyperlipidemia: Secondary | ICD-10-CM | POA: Diagnosis not present

## 2019-05-07 DIAGNOSIS — I5020 Unspecified systolic (congestive) heart failure: Secondary | ICD-10-CM | POA: Diagnosis not present

## 2019-05-09 DIAGNOSIS — R0602 Shortness of breath: Secondary | ICD-10-CM | POA: Diagnosis not present

## 2019-05-10 DIAGNOSIS — I509 Heart failure, unspecified: Secondary | ICD-10-CM | POA: Diagnosis not present

## 2019-05-10 DIAGNOSIS — G4733 Obstructive sleep apnea (adult) (pediatric): Secondary | ICD-10-CM | POA: Diagnosis not present

## 2019-05-22 DIAGNOSIS — I480 Paroxysmal atrial fibrillation: Secondary | ICD-10-CM | POA: Diagnosis not present

## 2019-05-23 DIAGNOSIS — I10 Essential (primary) hypertension: Secondary | ICD-10-CM | POA: Diagnosis not present

## 2019-05-23 DIAGNOSIS — E039 Hypothyroidism, unspecified: Secondary | ICD-10-CM | POA: Diagnosis not present

## 2019-05-23 DIAGNOSIS — G720 Drug-induced myopathy: Secondary | ICD-10-CM | POA: Diagnosis not present

## 2019-05-23 DIAGNOSIS — I5020 Unspecified systolic (congestive) heart failure: Secondary | ICD-10-CM | POA: Diagnosis not present

## 2019-05-23 DIAGNOSIS — I110 Hypertensive heart disease with heart failure: Secondary | ICD-10-CM | POA: Diagnosis not present

## 2019-05-23 DIAGNOSIS — E782 Mixed hyperlipidemia: Secondary | ICD-10-CM | POA: Diagnosis not present

## 2019-05-30 DIAGNOSIS — I4892 Unspecified atrial flutter: Secondary | ICD-10-CM | POA: Diagnosis not present

## 2019-05-30 DIAGNOSIS — I5022 Chronic systolic (congestive) heart failure: Secondary | ICD-10-CM | POA: Diagnosis not present

## 2019-05-30 DIAGNOSIS — I110 Hypertensive heart disease with heart failure: Secondary | ICD-10-CM | POA: Diagnosis not present

## 2019-05-30 DIAGNOSIS — I5020 Unspecified systolic (congestive) heart failure: Secondary | ICD-10-CM | POA: Diagnosis not present

## 2019-05-30 DIAGNOSIS — G720 Drug-induced myopathy: Secondary | ICD-10-CM | POA: Diagnosis not present

## 2019-05-30 DIAGNOSIS — I4891 Unspecified atrial fibrillation: Secondary | ICD-10-CM | POA: Diagnosis not present

## 2019-05-30 DIAGNOSIS — E782 Mixed hyperlipidemia: Secondary | ICD-10-CM | POA: Diagnosis not present

## 2019-05-30 DIAGNOSIS — I10 Essential (primary) hypertension: Secondary | ICD-10-CM | POA: Diagnosis not present

## 2019-05-30 DIAGNOSIS — E039 Hypothyroidism, unspecified: Secondary | ICD-10-CM | POA: Diagnosis not present

## 2019-05-30 DIAGNOSIS — R7301 Impaired fasting glucose: Secondary | ICD-10-CM | POA: Diagnosis not present

## 2019-05-30 DIAGNOSIS — I482 Chronic atrial fibrillation, unspecified: Secondary | ICD-10-CM | POA: Diagnosis not present

## 2019-06-04 DIAGNOSIS — J449 Chronic obstructive pulmonary disease, unspecified: Secondary | ICD-10-CM | POA: Diagnosis not present

## 2019-06-04 DIAGNOSIS — R0602 Shortness of breath: Secondary | ICD-10-CM | POA: Diagnosis not present

## 2019-06-04 DIAGNOSIS — J302 Other seasonal allergic rhinitis: Secondary | ICD-10-CM | POA: Diagnosis not present

## 2019-06-04 DIAGNOSIS — I4819 Other persistent atrial fibrillation: Secondary | ICD-10-CM | POA: Diagnosis not present

## 2019-06-04 DIAGNOSIS — E039 Hypothyroidism, unspecified: Secondary | ICD-10-CM | POA: Diagnosis not present

## 2019-06-04 DIAGNOSIS — I509 Heart failure, unspecified: Secondary | ICD-10-CM | POA: Diagnosis not present

## 2019-06-04 DIAGNOSIS — I10 Essential (primary) hypertension: Secondary | ICD-10-CM | POA: Diagnosis not present

## 2019-06-04 DIAGNOSIS — E785 Hyperlipidemia, unspecified: Secondary | ICD-10-CM | POA: Diagnosis not present

## 2019-06-10 DIAGNOSIS — I509 Heart failure, unspecified: Secondary | ICD-10-CM | POA: Diagnosis not present

## 2019-06-10 DIAGNOSIS — G4733 Obstructive sleep apnea (adult) (pediatric): Secondary | ICD-10-CM | POA: Diagnosis not present

## 2019-07-08 DIAGNOSIS — I5020 Unspecified systolic (congestive) heart failure: Secondary | ICD-10-CM | POA: Diagnosis not present

## 2019-07-08 DIAGNOSIS — E782 Mixed hyperlipidemia: Secondary | ICD-10-CM | POA: Diagnosis not present

## 2019-07-08 DIAGNOSIS — E039 Hypothyroidism, unspecified: Secondary | ICD-10-CM | POA: Diagnosis not present

## 2019-07-08 DIAGNOSIS — I10 Essential (primary) hypertension: Secondary | ICD-10-CM | POA: Diagnosis not present

## 2019-07-08 DIAGNOSIS — I110 Hypertensive heart disease with heart failure: Secondary | ICD-10-CM | POA: Diagnosis not present

## 2019-07-08 DIAGNOSIS — G720 Drug-induced myopathy: Secondary | ICD-10-CM | POA: Diagnosis not present

## 2019-07-10 DIAGNOSIS — G4733 Obstructive sleep apnea (adult) (pediatric): Secondary | ICD-10-CM | POA: Diagnosis not present

## 2019-07-10 DIAGNOSIS — I509 Heart failure, unspecified: Secondary | ICD-10-CM | POA: Diagnosis not present

## 2019-08-10 DIAGNOSIS — I509 Heart failure, unspecified: Secondary | ICD-10-CM | POA: Diagnosis not present

## 2019-08-21 DIAGNOSIS — G720 Drug-induced myopathy: Secondary | ICD-10-CM | POA: Diagnosis not present

## 2019-08-21 DIAGNOSIS — I110 Hypertensive heart disease with heart failure: Secondary | ICD-10-CM | POA: Diagnosis not present

## 2019-08-21 DIAGNOSIS — E039 Hypothyroidism, unspecified: Secondary | ICD-10-CM | POA: Diagnosis not present

## 2019-08-21 DIAGNOSIS — E7849 Other hyperlipidemia: Secondary | ICD-10-CM | POA: Diagnosis not present

## 2019-08-21 DIAGNOSIS — I5020 Unspecified systolic (congestive) heart failure: Secondary | ICD-10-CM | POA: Diagnosis not present

## 2019-08-21 DIAGNOSIS — I10 Essential (primary) hypertension: Secondary | ICD-10-CM | POA: Diagnosis not present

## 2019-09-09 DIAGNOSIS — I509 Heart failure, unspecified: Secondary | ICD-10-CM | POA: Diagnosis not present

## 2019-09-13 DIAGNOSIS — I10 Essential (primary) hypertension: Secondary | ICD-10-CM | POA: Diagnosis not present

## 2019-09-13 DIAGNOSIS — Z0001 Encounter for general adult medical examination with abnormal findings: Secondary | ICD-10-CM | POA: Diagnosis not present

## 2019-09-13 DIAGNOSIS — N390 Urinary tract infection, site not specified: Secondary | ICD-10-CM | POA: Diagnosis not present

## 2019-09-13 DIAGNOSIS — I5020 Unspecified systolic (congestive) heart failure: Secondary | ICD-10-CM | POA: Diagnosis not present

## 2019-09-13 DIAGNOSIS — Z09 Encounter for follow-up examination after completed treatment for conditions other than malignant neoplasm: Secondary | ICD-10-CM | POA: Diagnosis not present

## 2019-09-13 DIAGNOSIS — R079 Chest pain, unspecified: Secondary | ICD-10-CM | POA: Diagnosis not present

## 2019-09-13 DIAGNOSIS — L03818 Cellulitis of other sites: Secondary | ICD-10-CM | POA: Diagnosis not present

## 2019-09-13 DIAGNOSIS — E782 Mixed hyperlipidemia: Secondary | ICD-10-CM | POA: Diagnosis not present

## 2019-09-13 DIAGNOSIS — G720 Drug-induced myopathy: Secondary | ICD-10-CM | POA: Diagnosis not present

## 2019-09-13 DIAGNOSIS — R0602 Shortness of breath: Secondary | ICD-10-CM | POA: Diagnosis not present

## 2019-09-13 DIAGNOSIS — I679 Cerebrovascular disease, unspecified: Secondary | ICD-10-CM | POA: Diagnosis not present

## 2019-09-13 DIAGNOSIS — S40862A Insect bite (nonvenomous) of left upper arm, initial encounter: Secondary | ICD-10-CM | POA: Diagnosis not present

## 2019-09-13 DIAGNOSIS — Z9981 Dependence on supplemental oxygen: Secondary | ICD-10-CM | POA: Diagnosis not present

## 2019-09-13 DIAGNOSIS — R509 Fever, unspecified: Secondary | ICD-10-CM | POA: Diagnosis not present

## 2019-09-20 DIAGNOSIS — G720 Drug-induced myopathy: Secondary | ICD-10-CM | POA: Diagnosis not present

## 2019-09-20 DIAGNOSIS — I5020 Unspecified systolic (congestive) heart failure: Secondary | ICD-10-CM | POA: Diagnosis not present

## 2019-09-20 DIAGNOSIS — I110 Hypertensive heart disease with heart failure: Secondary | ICD-10-CM | POA: Diagnosis not present

## 2019-09-20 DIAGNOSIS — E039 Hypothyroidism, unspecified: Secondary | ICD-10-CM | POA: Diagnosis not present

## 2019-09-20 DIAGNOSIS — I10 Essential (primary) hypertension: Secondary | ICD-10-CM | POA: Diagnosis not present

## 2019-09-20 DIAGNOSIS — E7849 Other hyperlipidemia: Secondary | ICD-10-CM | POA: Diagnosis not present

## 2019-10-01 DIAGNOSIS — R9431 Abnormal electrocardiogram [ECG] [EKG]: Secondary | ICD-10-CM | POA: Diagnosis not present

## 2019-10-01 DIAGNOSIS — Z8679 Personal history of other diseases of the circulatory system: Secondary | ICD-10-CM | POA: Diagnosis not present

## 2019-10-01 DIAGNOSIS — I4892 Unspecified atrial flutter: Secondary | ICD-10-CM | POA: Diagnosis not present

## 2019-10-01 DIAGNOSIS — I10 Essential (primary) hypertension: Secondary | ICD-10-CM | POA: Diagnosis not present

## 2019-10-01 DIAGNOSIS — Z7901 Long term (current) use of anticoagulants: Secondary | ICD-10-CM | POA: Diagnosis not present

## 2019-10-01 DIAGNOSIS — Z9889 Other specified postprocedural states: Secondary | ICD-10-CM | POA: Diagnosis not present

## 2019-10-01 DIAGNOSIS — I480 Paroxysmal atrial fibrillation: Secondary | ICD-10-CM | POA: Diagnosis not present

## 2019-10-08 DIAGNOSIS — I10 Essential (primary) hypertension: Secondary | ICD-10-CM | POA: Diagnosis not present

## 2019-10-08 DIAGNOSIS — R509 Fever, unspecified: Secondary | ICD-10-CM | POA: Diagnosis not present

## 2019-10-08 DIAGNOSIS — I5020 Unspecified systolic (congestive) heart failure: Secondary | ICD-10-CM | POA: Diagnosis not present

## 2019-10-08 DIAGNOSIS — R0602 Shortness of breath: Secondary | ICD-10-CM | POA: Diagnosis not present

## 2019-10-08 DIAGNOSIS — Z0001 Encounter for general adult medical examination with abnormal findings: Secondary | ICD-10-CM | POA: Diagnosis not present

## 2019-10-08 DIAGNOSIS — R079 Chest pain, unspecified: Secondary | ICD-10-CM | POA: Diagnosis not present

## 2019-10-08 DIAGNOSIS — G720 Drug-induced myopathy: Secondary | ICD-10-CM | POA: Diagnosis not present

## 2019-10-08 DIAGNOSIS — E782 Mixed hyperlipidemia: Secondary | ICD-10-CM | POA: Diagnosis not present

## 2019-10-08 DIAGNOSIS — Z9981 Dependence on supplemental oxygen: Secondary | ICD-10-CM | POA: Diagnosis not present

## 2019-10-08 DIAGNOSIS — L03818 Cellulitis of other sites: Secondary | ICD-10-CM | POA: Diagnosis not present

## 2019-10-08 DIAGNOSIS — Z09 Encounter for follow-up examination after completed treatment for conditions other than malignant neoplasm: Secondary | ICD-10-CM | POA: Diagnosis not present

## 2019-10-10 DIAGNOSIS — I509 Heart failure, unspecified: Secondary | ICD-10-CM | POA: Diagnosis not present

## 2019-10-11 DIAGNOSIS — E7849 Other hyperlipidemia: Secondary | ICD-10-CM | POA: Diagnosis not present

## 2019-10-11 DIAGNOSIS — G720 Drug-induced myopathy: Secondary | ICD-10-CM | POA: Diagnosis not present

## 2019-10-11 DIAGNOSIS — I110 Hypertensive heart disease with heart failure: Secondary | ICD-10-CM | POA: Diagnosis not present

## 2019-10-11 DIAGNOSIS — E039 Hypothyroidism, unspecified: Secondary | ICD-10-CM | POA: Diagnosis not present

## 2019-10-11 DIAGNOSIS — I5020 Unspecified systolic (congestive) heart failure: Secondary | ICD-10-CM | POA: Diagnosis not present

## 2019-10-11 DIAGNOSIS — I10 Essential (primary) hypertension: Secondary | ICD-10-CM | POA: Diagnosis not present

## 2019-11-10 DIAGNOSIS — I509 Heart failure, unspecified: Secondary | ICD-10-CM | POA: Diagnosis not present

## 2019-11-13 DIAGNOSIS — G720 Drug-induced myopathy: Secondary | ICD-10-CM | POA: Diagnosis not present

## 2019-11-13 DIAGNOSIS — I110 Hypertensive heart disease with heart failure: Secondary | ICD-10-CM | POA: Diagnosis not present

## 2019-11-13 DIAGNOSIS — E7849 Other hyperlipidemia: Secondary | ICD-10-CM | POA: Diagnosis not present

## 2019-11-13 DIAGNOSIS — E039 Hypothyroidism, unspecified: Secondary | ICD-10-CM | POA: Diagnosis not present

## 2019-11-13 DIAGNOSIS — I10 Essential (primary) hypertension: Secondary | ICD-10-CM | POA: Diagnosis not present

## 2019-11-13 DIAGNOSIS — I5020 Unspecified systolic (congestive) heart failure: Secondary | ICD-10-CM | POA: Diagnosis not present

## 2019-12-02 DIAGNOSIS — I10 Essential (primary) hypertension: Secondary | ICD-10-CM | POA: Diagnosis not present

## 2019-12-02 DIAGNOSIS — E039 Hypothyroidism, unspecified: Secondary | ICD-10-CM | POA: Diagnosis not present

## 2019-12-02 DIAGNOSIS — G720 Drug-induced myopathy: Secondary | ICD-10-CM | POA: Diagnosis not present

## 2019-12-02 DIAGNOSIS — I5020 Unspecified systolic (congestive) heart failure: Secondary | ICD-10-CM | POA: Diagnosis not present

## 2019-12-02 DIAGNOSIS — E7849 Other hyperlipidemia: Secondary | ICD-10-CM | POA: Diagnosis not present

## 2019-12-02 DIAGNOSIS — I110 Hypertensive heart disease with heart failure: Secondary | ICD-10-CM | POA: Diagnosis not present

## 2019-12-10 DIAGNOSIS — I509 Heart failure, unspecified: Secondary | ICD-10-CM | POA: Diagnosis not present

## 2019-12-10 DIAGNOSIS — G4733 Obstructive sleep apnea (adult) (pediatric): Secondary | ICD-10-CM | POA: Diagnosis not present

## 2019-12-12 DIAGNOSIS — Z0001 Encounter for general adult medical examination with abnormal findings: Secondary | ICD-10-CM | POA: Diagnosis not present

## 2019-12-12 DIAGNOSIS — E782 Mixed hyperlipidemia: Secondary | ICD-10-CM | POA: Diagnosis not present

## 2019-12-12 DIAGNOSIS — Z9981 Dependence on supplemental oxygen: Secondary | ICD-10-CM | POA: Diagnosis not present

## 2019-12-12 DIAGNOSIS — R0602 Shortness of breath: Secondary | ICD-10-CM | POA: Diagnosis not present

## 2019-12-12 DIAGNOSIS — Z09 Encounter for follow-up examination after completed treatment for conditions other than malignant neoplasm: Secondary | ICD-10-CM | POA: Diagnosis not present

## 2019-12-12 DIAGNOSIS — I5020 Unspecified systolic (congestive) heart failure: Secondary | ICD-10-CM | POA: Diagnosis not present

## 2019-12-12 DIAGNOSIS — R079 Chest pain, unspecified: Secondary | ICD-10-CM | POA: Diagnosis not present

## 2019-12-12 DIAGNOSIS — G720 Drug-induced myopathy: Secondary | ICD-10-CM | POA: Diagnosis not present

## 2019-12-12 DIAGNOSIS — L03818 Cellulitis of other sites: Secondary | ICD-10-CM | POA: Diagnosis not present

## 2019-12-12 DIAGNOSIS — I10 Essential (primary) hypertension: Secondary | ICD-10-CM | POA: Diagnosis not present

## 2019-12-17 DIAGNOSIS — E039 Hypothyroidism, unspecified: Secondary | ICD-10-CM | POA: Diagnosis not present

## 2019-12-17 DIAGNOSIS — J302 Other seasonal allergic rhinitis: Secondary | ICD-10-CM | POA: Diagnosis not present

## 2019-12-17 DIAGNOSIS — L03039 Cellulitis of unspecified toe: Secondary | ICD-10-CM | POA: Diagnosis not present

## 2019-12-17 DIAGNOSIS — Z0001 Encounter for general adult medical examination with abnormal findings: Secondary | ICD-10-CM | POA: Diagnosis not present

## 2019-12-17 DIAGNOSIS — J449 Chronic obstructive pulmonary disease, unspecified: Secondary | ICD-10-CM | POA: Diagnosis not present

## 2019-12-17 DIAGNOSIS — R0602 Shortness of breath: Secondary | ICD-10-CM | POA: Diagnosis not present

## 2019-12-17 DIAGNOSIS — E785 Hyperlipidemia, unspecified: Secondary | ICD-10-CM | POA: Diagnosis not present

## 2019-12-17 DIAGNOSIS — I4819 Other persistent atrial fibrillation: Secondary | ICD-10-CM | POA: Diagnosis not present

## 2019-12-17 DIAGNOSIS — E6609 Other obesity due to excess calories: Secondary | ICD-10-CM | POA: Diagnosis not present

## 2019-12-17 DIAGNOSIS — I10 Essential (primary) hypertension: Secondary | ICD-10-CM | POA: Diagnosis not present

## 2019-12-17 DIAGNOSIS — I509 Heart failure, unspecified: Secondary | ICD-10-CM | POA: Diagnosis not present

## 2020-01-10 DIAGNOSIS — I509 Heart failure, unspecified: Secondary | ICD-10-CM | POA: Diagnosis not present

## 2020-01-21 DIAGNOSIS — E039 Hypothyroidism, unspecified: Secondary | ICD-10-CM | POA: Diagnosis not present

## 2020-01-21 DIAGNOSIS — E7849 Other hyperlipidemia: Secondary | ICD-10-CM | POA: Diagnosis not present

## 2020-01-21 DIAGNOSIS — I509 Heart failure, unspecified: Secondary | ICD-10-CM | POA: Diagnosis not present

## 2020-01-21 DIAGNOSIS — I10 Essential (primary) hypertension: Secondary | ICD-10-CM | POA: Diagnosis not present

## 2020-02-09 DIAGNOSIS — I509 Heart failure, unspecified: Secondary | ICD-10-CM | POA: Diagnosis not present

## 2020-02-21 DIAGNOSIS — E039 Hypothyroidism, unspecified: Secondary | ICD-10-CM | POA: Diagnosis not present

## 2020-02-21 DIAGNOSIS — I10 Essential (primary) hypertension: Secondary | ICD-10-CM | POA: Diagnosis not present

## 2020-02-21 DIAGNOSIS — E7849 Other hyperlipidemia: Secondary | ICD-10-CM | POA: Diagnosis not present

## 2020-02-21 DIAGNOSIS — I509 Heart failure, unspecified: Secondary | ICD-10-CM | POA: Diagnosis not present

## 2020-03-10 DIAGNOSIS — G4733 Obstructive sleep apnea (adult) (pediatric): Secondary | ICD-10-CM | POA: Diagnosis not present

## 2020-03-11 DIAGNOSIS — I509 Heart failure, unspecified: Secondary | ICD-10-CM | POA: Diagnosis not present

## 2020-03-21 DIAGNOSIS — I110 Hypertensive heart disease with heart failure: Secondary | ICD-10-CM | POA: Diagnosis not present

## 2020-03-21 DIAGNOSIS — I10 Essential (primary) hypertension: Secondary | ICD-10-CM | POA: Diagnosis not present

## 2020-03-21 DIAGNOSIS — I5020 Unspecified systolic (congestive) heart failure: Secondary | ICD-10-CM | POA: Diagnosis not present

## 2020-03-21 DIAGNOSIS — G720 Drug-induced myopathy: Secondary | ICD-10-CM | POA: Diagnosis not present

## 2020-03-21 DIAGNOSIS — E039 Hypothyroidism, unspecified: Secondary | ICD-10-CM | POA: Diagnosis not present

## 2020-03-21 DIAGNOSIS — E7849 Other hyperlipidemia: Secondary | ICD-10-CM | POA: Diagnosis not present

## 2020-04-10 DIAGNOSIS — G4733 Obstructive sleep apnea (adult) (pediatric): Secondary | ICD-10-CM | POA: Diagnosis not present

## 2020-04-11 DIAGNOSIS — I509 Heart failure, unspecified: Secondary | ICD-10-CM | POA: Diagnosis not present

## 2020-04-20 DIAGNOSIS — I110 Hypertensive heart disease with heart failure: Secondary | ICD-10-CM | POA: Diagnosis not present

## 2020-04-20 DIAGNOSIS — E7849 Other hyperlipidemia: Secondary | ICD-10-CM | POA: Diagnosis not present

## 2020-04-20 DIAGNOSIS — I10 Essential (primary) hypertension: Secondary | ICD-10-CM | POA: Diagnosis not present

## 2020-04-20 DIAGNOSIS — I5020 Unspecified systolic (congestive) heart failure: Secondary | ICD-10-CM | POA: Diagnosis not present

## 2020-04-20 DIAGNOSIS — G720 Drug-induced myopathy: Secondary | ICD-10-CM | POA: Diagnosis not present

## 2020-04-20 DIAGNOSIS — E039 Hypothyroidism, unspecified: Secondary | ICD-10-CM | POA: Diagnosis not present

## 2020-05-08 DIAGNOSIS — G4733 Obstructive sleep apnea (adult) (pediatric): Secondary | ICD-10-CM | POA: Diagnosis not present

## 2020-05-09 DIAGNOSIS — I509 Heart failure, unspecified: Secondary | ICD-10-CM | POA: Diagnosis not present

## 2020-05-15 ENCOUNTER — Emergency Department (HOSPITAL_COMMUNITY): Payer: Medicare HMO

## 2020-05-15 ENCOUNTER — Emergency Department (HOSPITAL_COMMUNITY)
Admission: EM | Admit: 2020-05-15 | Discharge: 2020-05-15 | Disposition: A | Discharge status: Home / Self Care | Payer: Medicare HMO | Attending: Emergency Medicine | Admitting: Emergency Medicine

## 2020-05-15 ENCOUNTER — Other Ambulatory Visit: Payer: Self-pay

## 2020-05-15 ENCOUNTER — Encounter (HOSPITAL_COMMUNITY): Payer: Self-pay | Admitting: *Deleted

## 2020-05-15 DIAGNOSIS — M7918 Myalgia, other site: Secondary | ICD-10-CM

## 2020-05-15 DIAGNOSIS — I5033 Acute on chronic diastolic (congestive) heart failure: Secondary | ICD-10-CM | POA: Diagnosis not present

## 2020-05-15 DIAGNOSIS — Z79899 Other long term (current) drug therapy: Secondary | ICD-10-CM | POA: Insufficient documentation

## 2020-05-15 DIAGNOSIS — M5136 Other intervertebral disc degeneration, lumbar region: Secondary | ICD-10-CM | POA: Diagnosis not present

## 2020-05-15 DIAGNOSIS — M791 Myalgia, unspecified site: Secondary | ICD-10-CM | POA: Insufficient documentation

## 2020-05-15 DIAGNOSIS — S79912A Unspecified injury of left hip, initial encounter: Secondary | ICD-10-CM | POA: Diagnosis not present

## 2020-05-15 DIAGNOSIS — Y92002 Bathroom of unspecified non-institutional (private) residence single-family (private) house as the place of occurrence of the external cause: Secondary | ICD-10-CM | POA: Insufficient documentation

## 2020-05-15 DIAGNOSIS — I110 Hypertensive heart disease with heart failure: Secondary | ICD-10-CM | POA: Diagnosis not present

## 2020-05-15 DIAGNOSIS — M25552 Pain in left hip: Secondary | ICD-10-CM

## 2020-05-15 DIAGNOSIS — W182XXA Fall in (into) shower or empty bathtub, initial encounter: Secondary | ICD-10-CM | POA: Diagnosis not present

## 2020-05-15 DIAGNOSIS — M1612 Unilateral primary osteoarthritis, left hip: Secondary | ICD-10-CM | POA: Diagnosis not present

## 2020-05-15 MED ORDER — FENTANYL CITRATE (PF) 100 MCG/2ML IJ SOLN
50.0000 ug | Freq: Once | INTRAMUSCULAR | Status: AC
  Administered 2020-05-15: 50 ug via INTRAMUSCULAR
  Filled 2020-05-15: qty 2

## 2020-05-15 MED ORDER — METHOCARBAMOL 500 MG PO TABS: 500.0000 mg | ORAL_TABLET | Freq: Two times a day (BID) | ORAL | 0 refills | Status: AC

## 2020-05-15 MED ORDER — ONDANSETRON 4 MG PO TBDP
4.0000 mg | ORAL_TABLET | Freq: Once | ORAL | Status: AC
  Administered 2020-05-15: 4 mg via ORAL
  Filled 2020-05-15: qty 1

## 2020-05-15 MED ORDER — DIAZEPAM 2 MG PO TABS
2.0000 mg | ORAL_TABLET | Freq: Once | ORAL | Status: AC
  Administered 2020-05-15: 2 mg via ORAL
  Filled 2020-05-15: qty 1

## 2020-05-15 NOTE — ED Triage Notes (Signed)
Left hip pain, fell 2 weeks ago

## 2020-05-15 NOTE — ED Provider Notes (Signed)
[facility] EMERGENCY DEPARTMENT Provider Note   CSN: [id number] Arrival date & time: 05/15/20  1357     History Chief Complaint  Patient presents with  . Fall    Lindsay Whitehead is a 75 y.o. female with PMH/o Atrial flutter, CHF, HTN who presents for evaluation of left hip pain after mechanical fall.  She reports she initially fell about 2 weeks ago.  She reports she was getting in a bathtub and tripped and fell, landing on her left hip.  She states she did not hit her head or lose consciousness at that time.  She states that her entire left leg was sore and bruised after that.  She states that she had continued to have some pain but felt like it was slowly getting better.  She reports that about 3 days ago, she was getting ready to sit in a tractor.  She states she sat down and misjudged the distance to the seat.  She reports that she landed on the tractor seat squarely on her left hip, and since then has had worsening pain.  She has been able to ambulate on it but does report that the pain is worse when she moves her leg. She tried taking some oxycodone yesterday for pain but she said it made her nauseous with dry heaves so she couldn't take any more of it.  She denies any numbness/weakness.  She denies any head injury, LOC with the second fall.  She denies any numbness/weakness, neck pain, back pain, CP, SOB, abdominal pain.   The history is provided by the patient.       Past Medical History:  Diagnosis Date  . Atrial flutter (HCC)   . Diastolic CHF (HCC)   . Episodic atrial fibrillation (HCC)    Converted to NSR recent hospitalization 01/2013 on Dilt  . Fatigue   . Hypercholesterolemia   . Hypertension   . Obesity   . Right bundle branch block    Appears to be chronic  . Vertigo     Patient Active Problem List   Diagnosis Date Noted  . Acute hypoxemic respiratory failure (HCC) 08/31/2018  . Atrial fibrillation with RVR (HCC) 08/12/2015  . Non compliance with medical  treatment 08/12/2015  . HTN (hypertension) 08/12/2015  . Acute respiratory failure with hypoxia (HCC) 08/12/2015  . Acute on chronic diastolic (congestive) heart failure (HCC) 08/12/2015  . Pulmonary edema cardiac cause (HCC) 08/12/2015  . Chronic diastolic CHF (congestive heart failure) (HCC) 07/30/2015  . CHF (congestive heart failure) (HCC) 11/17/2014  . Right bundle branch block 07/19/2014  . Atrial flutter with rapid ventricular response (HCC) 01/20/2013  . Elevated blood pressure 01/20/2013  . Depression 01/20/2013    Past Surgical History:  Procedure Laterality Date  . carpel tunnel surgery    . TONSILLECTOMY    . WRIST FRACTURE SURGERY       OB History    Gravida  3   Para  3   Term  3   Preterm      AB      Living  2     SAB      IAB      Ectopic      Multiple      Live Births              Family History  Problem Relation Age of Onset  . Asthma Mother   . Hypertension Mother   . COPD Father   . Cancer Other   .  Cardiomyopathy Brother        Congenital with need for heart transplant. Died at 8.    Social History   Tobacco Use  . Smoking status: Never Smoker  . Smokeless tobacco: Never Used  Substance Use Topics  . Alcohol use: No    Alcohol/week: 4.0 - 5.0 standard drinks    Types: 4 - 5 Standard drinks or equivalent per week    Comment: rarely  . Drug use: No    Home Medications Prior to Admission medications   Medication Sig Start Date End Date Taking? Authorizing Provider  methocarbamol (ROBAXIN) 500 MG tablet Take 1 tablet (500 mg total) by mouth 2 (two) times daily. 05/15/20  Yes Maxwell Caul, PA-C  Biotin 03546 MCG TABS Take 5,000 mcg by mouth daily.    [provider]  carvedilol (COREG) 3.125 MG tablet Take 3.125 mg by mouth daily.    [provider]  esomeprazole (NEXIUM) 20 MG capsule Take 20 mg by mouth as needed. Reported on 09/07/2015    [provider]  furosemide (LASIX) 40 MG tablet  Take 40 mg by mouth as needed.    [provider]  levothyroxine (SYNTHROID) 25 MCG tablet Take 25 mcg by mouth daily before breakfast.    [provider]  MAGNESIUM-ZINC PO Take by mouth.    [provider]  NON FORMULARY daily. Sea Kelp 150mg     [provider]  potassium chloride (K-DUR) 10 MEQ tablet Take 1 tablet (10 mEq total) by mouth daily. 09/18/15   09/20/15, MD  vitamin B-12 (CYANOCOBALAMIN) 1000 MCG tablet Take 1,000 mcg by mouth daily.    [provider]    Allergies    Bystolic [nebivolol hcl], Metoprolol, Asa [aspirin], Diltiazem, Edarbi [azilsartan], Lasix [furosemide], and Zantac [ranitidine hcl]  Review of Systems   Review of Systems  Respiratory: Negative for shortness of breath.   Cardiovascular: Negative for chest pain.  Gastrointestinal: Negative for abdominal pain, nausea and vomiting.  Musculoskeletal: Negative for back pain and neck pain.       Left hip pain  Skin: Negative for rash.  Neurological: Negative for weakness, light-headedness, numbness and headaches.  All other systems reviewed and are negative.   Physical Exam Updated Vital Signs BP (!) 167/59   Pulse 66   Temp (!) 97.3 F (36.3 C) (Oral)   Resp 18   SpO2 100%   Physical Exam Vitals and nursing note reviewed.  Constitutional:      Appearance: She is well-developed.  HENT:     Head: Normocephalic and atraumatic.  Eyes:     General: No scleral icterus.       Right eye: No discharge.        Left eye: No discharge.     Conjunctiva/sclera: Conjunctivae normal.  Neck:     Comments: Full flexion/extension and lateral movement of neck fully intact. No bony midline tenderness. No deformities or crepitus.  Cardiovascular:     Pulses:          Radial pulses are 2+ on the right side and 2+ on the left side.       Dorsalis pedis pulses are 2+ on the right side and 2+ on the left side.  Pulmonary:     Effort: Pulmonary effort is normal.   Musculoskeletal:     Comments: No midline T or L spine tenderness. No deformities or crepitus noted.  No bony deformity or crepitus noted to left hip.  No  bony tenderness.  She reports that when we range of motion her hip, she feels pain on the inside.  She can fully flex and extend the hip without any difficulty.  She does have some pain with internal and external rotation. No shortening, no rotation. No bony deformity or crepitus noted to left femur, left knee, left tib-fib.  No tenderness palpation of the right lower extremity.  Full motion of any difficulty.  Skin:    General: Skin is warm and dry.     Capillary Refill: Capillary refill takes less than 2 seconds.     Comments: Good distal cap refill. LLE is not dusky in appearance or cool to touch.  Neurological:     Mental Status: She is alert.     Comments: Sensation intact along major nerve distributions of BLE  Psychiatric:        Speech: Speech normal.        Behavior: Behavior normal.     ED Results / Procedures / Treatments   Labs (all labs ordered are listed, but only abnormal results are displayed) Labs Reviewed - No data to display  EKG None  Radiology DG Hip Unilat W or Wo Pelvis 2-3 Views Left  Result Date: 05/15/2020 CLINICAL DATA:  Hip pain.Patient slipped while getting into a garden tub 2 weeks ago EXAM: DG HIP (WITH OR WITHOUT PELVIS) 2-3V LEFT COMPARISON:  None FINDINGS: Both hips appear located. There is no signs of acute fracture or dislocation. Mild bilateral hip osteoarthritis.Degenerative disc disease noted within the lower lumbar spine. IMPRESSION: No acute abnormality. Electronically Signed   By: Signa Kell M.D.   On: 05/15/2020 15:15    Procedures Procedures   Medications Ordered in ED Medications  diazepam (VALIUM) tablet 2 mg (2 mg Oral Given 05/15/20 1448)  fentaNYL (SUBLIMAZE) injection 50 mcg (50 mcg Intramuscular Given 05/15/20 1448)  ondansetron (ZOFRAN-ODT) disintegrating tablet 4 mg (4 mg  Oral Given 05/15/20 1448)    ED Course  I have reviewed the triage vital signs and the nursing notes.  Pertinent labs & imaging results that were available during my care of the patient were reviewed by me and considered in my medical decision making (see chart for details).    MDM Rules/Calculators/A&P                          75 year old female who presents for evaluation of left hip pain after mechanical fall that occurred first occurred 2 weeks ago.  She reports another fall about 3 days ago.  Since then has had worsening pain.  She has been able to ambulate but reports hurts more when she moves.  On initial arrival, she is afebrile, toxic appearing.  Vital signs are stable.  On exam, she is neurovascularly intact.  There is no bony deformity or tenderness noted.  When I range of motion her hip, she does have pain noted in left hip.  It seems to be worse with internal and external rotation of the hip.  Question if this is musculoskeletal/muscle strain.  Also consider fracture given history of trauma.  Given her age, will obtain x-ray imaging.  History/physical exam not concerning for ischemic limb, septic arthritis.  XR of hip shows no signs of acute fracture or dislocation. There is evidence of mild bilateral hip osteoarthritis and degenerative disk disease noted.   Discussed results with patient.  Patient has been amatory since this happened.  Do not feel that this warrants  further imaging for evaluation of occult fracture.  The pain is mostly when she moves the leg or internally and externally rotates.  Question if she has a small muscle tear/muscle strain.  We will plan to treat with small amount of muscle relaxers. At this time, patient exhibits no emergent life-threatening condition that require further evaluation in ED. Discussed patient with Dr. Hyacinth Meeker who is agreeable to plan. Patient had ample opportunity for questions and discussion. All patient's questions were answered with full  understanding. Strict return precautions discussed. Patient expresses understanding and agreement to plan.   Portions of this note were generated with Scientist, clinical (histocompatibility and immunogenetics). Dictation errors may occur despite best attempts at proofreading.   Final Clinical Impression(s) / ED Diagnoses Final diagnoses:  Left hip pain  Musculoskeletal pain    Rx / DC Orders ED Discharge Orders         Ordered    methocarbamol (ROBAXIN) 500 MG tablet  2 times daily        05/15/20 1601           Rosana Hoes 05/15/20 1609    Eber Hong, MD 05/15/20 [phone]

## 2020-05-15 NOTE — ED Provider Notes (Signed)
Medical screening examination/treatment/procedure(s) were conducted as a shared visit with non-physician practitioner(s) and myself.  I personally evaluated the patient during the encounter.  Clinical Impression:   Final diagnoses:  Left hip pain  Musculoskeletal pain    The patient has had a recent fall on her hip, left side, has had pain that improved however several days ago it worsened, seems to get worse when she tries to flex the hip or rotate the hip, feels like it is a deep pain, she cannot reproduce it by pushing on the leg.  When I put her leg through full range of motion she has very little pain with passive range of motion but with actively trying to flex her hip she has increasing pain that reproduces it.  No numbness or weakness, otherwise no signs of trauma on x-ray, stable for discharge to follow-up in the outpatient setting with physical therapy and family doctor, the patient is agreeable to this plan.     Eber Hong, MD 05/15/20 [phone]

## 2020-05-15 NOTE — Discharge Instructions (Addendum)
Take Robaxin as prescribed. This medication will make you drowsy so do not drive or drink alcohol when taking it.  You can take Tylenol or Ibuprofen as directed for pain. You can alternate Tylenol and Ibuprofen every 4 hours. If you take Tylenol at 1pm, then you can take Ibuprofen at 5pm. Then you can take Tylenol again at 9pm.   Please follow up with your primary care doctor in a week.   Return to the Emergency Dept for any worsening pain, numbness/weakness or any other worsening or concerning symptoms.

## 2020-05-20 DIAGNOSIS — I110 Hypertensive heart disease with heart failure: Secondary | ICD-10-CM | POA: Diagnosis not present

## 2020-05-20 DIAGNOSIS — E039 Hypothyroidism, unspecified: Secondary | ICD-10-CM | POA: Diagnosis not present

## 2020-05-20 DIAGNOSIS — I10 Essential (primary) hypertension: Secondary | ICD-10-CM | POA: Diagnosis not present

## 2020-05-20 DIAGNOSIS — E7849 Other hyperlipidemia: Secondary | ICD-10-CM | POA: Diagnosis not present

## 2020-05-20 DIAGNOSIS — G720 Drug-induced myopathy: Secondary | ICD-10-CM | POA: Diagnosis not present

## 2020-06-09 DIAGNOSIS — I509 Heart failure, unspecified: Secondary | ICD-10-CM | POA: Diagnosis not present

## 2020-06-22 DIAGNOSIS — I10 Essential (primary) hypertension: Secondary | ICD-10-CM | POA: Diagnosis not present

## 2020-06-22 DIAGNOSIS — R9431 Abnormal electrocardiogram [ECG] [EKG]: Secondary | ICD-10-CM | POA: Diagnosis not present

## 2020-06-22 DIAGNOSIS — I4892 Unspecified atrial flutter: Secondary | ICD-10-CM | POA: Diagnosis not present

## 2020-06-22 DIAGNOSIS — Z9889 Other specified postprocedural states: Secondary | ICD-10-CM | POA: Diagnosis not present

## 2020-06-22 DIAGNOSIS — I480 Paroxysmal atrial fibrillation: Secondary | ICD-10-CM | POA: Diagnosis not present

## 2020-06-22 DIAGNOSIS — Z8679 Personal history of other diseases of the circulatory system: Secondary | ICD-10-CM | POA: Diagnosis not present

## 2020-06-25 DIAGNOSIS — I4819 Other persistent atrial fibrillation: Secondary | ICD-10-CM | POA: Diagnosis not present

## 2020-06-25 DIAGNOSIS — I110 Hypertensive heart disease with heart failure: Secondary | ICD-10-CM | POA: Diagnosis not present

## 2020-06-25 DIAGNOSIS — Z7689 Persons encountering health services in other specified circumstances: Secondary | ICD-10-CM | POA: Diagnosis not present

## 2020-06-25 DIAGNOSIS — L089 Local infection of the skin and subcutaneous tissue, unspecified: Secondary | ICD-10-CM | POA: Diagnosis not present

## 2020-06-25 DIAGNOSIS — I10 Essential (primary) hypertension: Secondary | ICD-10-CM | POA: Diagnosis not present

## 2020-06-25 DIAGNOSIS — E7849 Other hyperlipidemia: Secondary | ICD-10-CM | POA: Diagnosis not present

## 2020-06-25 DIAGNOSIS — E785 Hyperlipidemia, unspecified: Secondary | ICD-10-CM | POA: Diagnosis not present

## 2020-06-25 DIAGNOSIS — N179 Acute kidney failure, unspecified: Secondary | ICD-10-CM | POA: Diagnosis not present

## 2020-06-25 DIAGNOSIS — E039 Hypothyroidism, unspecified: Secondary | ICD-10-CM | POA: Diagnosis not present

## 2020-06-25 DIAGNOSIS — E782 Mixed hyperlipidemia: Secondary | ICD-10-CM | POA: Diagnosis not present

## 2020-06-25 DIAGNOSIS — Z6829 Body mass index (BMI) 29.0-29.9, adult: Secondary | ICD-10-CM | POA: Diagnosis not present

## 2020-06-29 DIAGNOSIS — L089 Local infection of the skin and subcutaneous tissue, unspecified: Secondary | ICD-10-CM | POA: Diagnosis not present

## 2020-06-29 DIAGNOSIS — E039 Hypothyroidism, unspecified: Secondary | ICD-10-CM | POA: Diagnosis not present

## 2020-06-29 DIAGNOSIS — M109 Gout, unspecified: Secondary | ICD-10-CM | POA: Diagnosis not present

## 2020-06-29 DIAGNOSIS — I10 Essential (primary) hypertension: Secondary | ICD-10-CM | POA: Diagnosis not present

## 2020-06-29 DIAGNOSIS — J449 Chronic obstructive pulmonary disease, unspecified: Secondary | ICD-10-CM | POA: Diagnosis not present

## 2020-06-29 DIAGNOSIS — J302 Other seasonal allergic rhinitis: Secondary | ICD-10-CM | POA: Diagnosis not present

## 2020-06-29 DIAGNOSIS — E782 Mixed hyperlipidemia: Secondary | ICD-10-CM | POA: Diagnosis not present

## 2020-06-29 DIAGNOSIS — I509 Heart failure, unspecified: Secondary | ICD-10-CM | POA: Diagnosis not present

## 2020-06-29 DIAGNOSIS — Z13820 Encounter for screening for osteoporosis: Secondary | ICD-10-CM | POA: Diagnosis not present

## 2020-06-29 DIAGNOSIS — I4819 Other persistent atrial fibrillation: Secondary | ICD-10-CM | POA: Diagnosis not present

## 2020-06-30 ENCOUNTER — Other Ambulatory Visit (HOSPITAL_COMMUNITY): Payer: Self-pay | Admitting: Student

## 2020-06-30 DIAGNOSIS — Z13820 Encounter for screening for osteoporosis: Secondary | ICD-10-CM

## 2020-07-21 DIAGNOSIS — I10 Essential (primary) hypertension: Secondary | ICD-10-CM | POA: Diagnosis not present

## 2020-07-21 DIAGNOSIS — E7800 Pure hypercholesterolemia, unspecified: Secondary | ICD-10-CM | POA: Diagnosis not present

## 2020-08-17 DIAGNOSIS — G4733 Obstructive sleep apnea (adult) (pediatric): Secondary | ICD-10-CM | POA: Diagnosis not present

## 2020-08-20 DIAGNOSIS — I10 Essential (primary) hypertension: Secondary | ICD-10-CM | POA: Diagnosis not present

## 2020-08-20 DIAGNOSIS — E1165 Type 2 diabetes mellitus with hyperglycemia: Secondary | ICD-10-CM | POA: Diagnosis not present

## 2020-09-17 DIAGNOSIS — G4733 Obstructive sleep apnea (adult) (pediatric): Secondary | ICD-10-CM | POA: Diagnosis not present

## 2020-09-20 DIAGNOSIS — I10 Essential (primary) hypertension: Secondary | ICD-10-CM | POA: Diagnosis not present

## 2020-09-20 DIAGNOSIS — E7800 Pure hypercholesterolemia, unspecified: Secondary | ICD-10-CM | POA: Diagnosis not present

## 2020-10-21 DIAGNOSIS — E119 Type 2 diabetes mellitus without complications: Secondary | ICD-10-CM | POA: Diagnosis not present

## 2020-10-21 DIAGNOSIS — I10 Essential (primary) hypertension: Secondary | ICD-10-CM | POA: Diagnosis not present

## 2020-10-30 DIAGNOSIS — I10 Essential (primary) hypertension: Secondary | ICD-10-CM | POA: Diagnosis not present

## 2020-10-30 DIAGNOSIS — E039 Hypothyroidism, unspecified: Secondary | ICD-10-CM | POA: Diagnosis not present

## 2020-11-02 DIAGNOSIS — Z13820 Encounter for screening for osteoporosis: Secondary | ICD-10-CM | POA: Diagnosis not present

## 2020-11-02 DIAGNOSIS — I4819 Other persistent atrial fibrillation: Secondary | ICD-10-CM | POA: Diagnosis not present

## 2020-11-02 DIAGNOSIS — I5022 Chronic systolic (congestive) heart failure: Secondary | ICD-10-CM | POA: Diagnosis not present

## 2020-11-02 DIAGNOSIS — Z0001 Encounter for general adult medical examination with abnormal findings: Secondary | ICD-10-CM | POA: Diagnosis not present

## 2020-11-02 DIAGNOSIS — J302 Other seasonal allergic rhinitis: Secondary | ICD-10-CM | POA: Diagnosis not present

## 2020-11-02 DIAGNOSIS — E782 Mixed hyperlipidemia: Secondary | ICD-10-CM | POA: Diagnosis not present

## 2020-11-02 DIAGNOSIS — I10 Essential (primary) hypertension: Secondary | ICD-10-CM | POA: Diagnosis not present

## 2020-11-02 DIAGNOSIS — E039 Hypothyroidism, unspecified: Secondary | ICD-10-CM | POA: Diagnosis not present

## 2020-11-02 DIAGNOSIS — M109 Gout, unspecified: Secondary | ICD-10-CM | POA: Diagnosis not present

## 2020-11-02 DIAGNOSIS — J449 Chronic obstructive pulmonary disease, unspecified: Secondary | ICD-10-CM | POA: Diagnosis not present

## 2020-11-20 DIAGNOSIS — E785 Hyperlipidemia, unspecified: Secondary | ICD-10-CM | POA: Diagnosis not present

## 2020-11-20 DIAGNOSIS — I10 Essential (primary) hypertension: Secondary | ICD-10-CM | POA: Diagnosis not present

## 2020-11-26 DIAGNOSIS — R32 Unspecified urinary incontinence: Secondary | ICD-10-CM | POA: Diagnosis not present

## 2020-11-26 DIAGNOSIS — G8929 Other chronic pain: Secondary | ICD-10-CM | POA: Diagnosis not present

## 2020-11-26 DIAGNOSIS — I739 Peripheral vascular disease, unspecified: Secondary | ICD-10-CM | POA: Diagnosis not present

## 2020-11-26 DIAGNOSIS — Z008 Encounter for other general examination: Secondary | ICD-10-CM | POA: Diagnosis not present

## 2020-11-26 DIAGNOSIS — R030 Elevated blood-pressure reading, without diagnosis of hypertension: Secondary | ICD-10-CM | POA: Diagnosis not present

## 2020-11-26 DIAGNOSIS — J45909 Unspecified asthma, uncomplicated: Secondary | ICD-10-CM | POA: Diagnosis not present

## 2020-11-26 DIAGNOSIS — E261 Secondary hyperaldosteronism: Secondary | ICD-10-CM | POA: Diagnosis not present

## 2020-11-26 DIAGNOSIS — I429 Cardiomyopathy, unspecified: Secondary | ICD-10-CM | POA: Diagnosis not present

## 2020-11-26 DIAGNOSIS — I110 Hypertensive heart disease with heart failure: Secondary | ICD-10-CM | POA: Diagnosis not present

## 2020-11-26 DIAGNOSIS — I2510 Atherosclerotic heart disease of native coronary artery without angina pectoris: Secondary | ICD-10-CM | POA: Diagnosis not present

## 2020-11-26 DIAGNOSIS — M1990 Unspecified osteoarthritis, unspecified site: Secondary | ICD-10-CM | POA: Diagnosis not present

## 2020-11-26 DIAGNOSIS — I509 Heart failure, unspecified: Secondary | ICD-10-CM | POA: Diagnosis not present

## 2020-11-26 DIAGNOSIS — E669 Obesity, unspecified: Secondary | ICD-10-CM | POA: Diagnosis not present

## 2020-12-21 DIAGNOSIS — I10 Essential (primary) hypertension: Secondary | ICD-10-CM | POA: Diagnosis not present

## 2020-12-21 DIAGNOSIS — E785 Hyperlipidemia, unspecified: Secondary | ICD-10-CM | POA: Diagnosis not present

## 2020-12-24 ENCOUNTER — Other Ambulatory Visit: Payer: Self-pay

## 2020-12-24 ENCOUNTER — Encounter: Payer: Self-pay | Admitting: Internal Medicine

## 2020-12-24 ENCOUNTER — Ambulatory Visit (INDEPENDENT_AMBULATORY_CARE_PROVIDER_SITE_OTHER): Payer: Medicare HMO | Admitting: Internal Medicine

## 2020-12-24 DIAGNOSIS — R0609 Other forms of dyspnea: Secondary | ICD-10-CM

## 2020-12-24 NOTE — Patient Instructions (Signed)
To get the most out of exercise, you need to be continuously aware that you are short of breath, but never out of breath, for at least 20-30 minutes daily. As you improve, it will actually be easier for you to do the same amount of exercise  in  30 minutes so always push to the level where you are short of breath.    Make sure you check your oxygen saturations at highest level of activity - not after stopping, and use 02 if the level drops below 90%  Please remember to go to the lab department   for your tests - we will call you with the results when they are available.      Please remember to go to the  x-ray department  @  [facility] for your tests - we will call you with the results when they are available     Set up follow up with one of our sleep doctors and if not improving with regular bicycle ergometry daily x 6 weeks then we will set you up for CPST in [place] which is done on a bicycle ergometer.

## 2020-12-24 NOTE — Progress Notes (Signed)
Lindsay Whitehead, female    DOB: 1946-01-06    MRN: [id number]   Brief patient profile:  74 yowf never smoker  referred to pulmonary clinic in [place]  12/24/2020 by Dr   Margo Aye  with h/o resp problems first started when developed atrial fibrillation though she's not clear on when that was ? 2015  Last admit:08/31/18 08/31/20  Elderly  female with medical history significant for paroxysmal atrial fibrillation/flutter, diastolic CHF, hypertension, dyslipidemia, obesity, and GERD who presented to the ED emergency department via EMS after she was noted to have some shortness of breath fevers and chills night PTA.   She was noted to have oxygen saturation of 86% on room air upon EMS arrival which improved rapidly with administration of nasal cannula oxygen.  Patient takes Lasix as needed at home for symptoms and weight gain and did notice a 2 pound weight gain over the last 1 to 2 days.  She denies any PND, orthopnea, chest pain, or lower extremity edema.   Patient was admitted with some mild acute hypoxemic respiratory failure secondary to acute on chronic diastolic CHF.  She has diuresed about a half a liter, but is no longer symptomatic and is not requiring any further oxygen.  Her symptoms were quite mild even on admission, but she was observed overnight.  She has had 2D echocardiogram performed while here with LVEF 50-55% and no other acute abnormalities noted.  She is to remain on usual medications including Lasix as needed and monitors her weights daily at home and knows to restrict her fluid intake.  No other acute events noted during the course of this admission.   Discharge Diagnoses:  Active Problems:   Acute hypoxemic respiratory failure (HCC)   Principal discharge diagnosis: Mild acute hypoxemic respiratory failure in the setting of acute on chronic diastolic CHF.   Discharge Instructions       Discharge Instructions     Diet - low sodium heart healthy   Complete by: As directed       Increase activity slowly   Complete by: As directed       History of Present Illness  12/24/2020  Pulmonary/ 1st office eval/ Demitrus Francisco / [facility]  Chief Complaint  Patient presents with   Consult    SOB. Sent home from hospital with oxygen.   Dyspnea: will not answer question "what makes you short of breath every time you attempt it? / rides stationery bike  x  5 min legs get tired x 3 per day  Cough: never  Sleep: [facility]/ Dr Juanetta Gosling  SABA use:  once a week   No obvious patterns in day to day or daytime variability or assoc excess/ purulent sputum or mucus plugs or hemoptysis or cp or chest tightness, subjective wheeze or overt sinus or hb symptoms.   Sleeping on cpap ok  without nocturnal  or early am exacerbation  of respiratory  c/o's or need for noct saba. Also denies any obvious fluctuation of symptoms with weather or environmental changes or other aggravating or alleviating factors except as outlined above   No unusual exposure hx or h/o childhood pna/ asthma or knowledge of premature birth.  Current Allergies, Complete Past Medical History, Past Surgical History, Family History, and Social History were reviewed in [employer] record.  ROS  The following are not active complaints unless bolded Hoarseness, sore throat, dysphagia, dental problems, itching, sneezing,  nasal congestion or discharge of excess mucus or purulent secretions, ear ache,  fever, chills, sweats, unintended wt loss or wt gain, classically pleuritic or exertional cp,  orthopnea pnd or arm/hand swelling  or leg swelling, presyncope, palpitations, abdominal pain, anorexia, nausea, vomiting, diarrhea  or change in bowel habits or change in bladder habits, change in stools or change in urine, dysuria, hematuria,  rash, arthralgias, visual complaints, headache, numbness, weakness or ataxia or problems with walking or coordination,  change in mood or  memory.             Past Medical  History:  Diagnosis Date   Atrial flutter (HCC)    Diastolic CHF ([place])    Episodic atrial fibrillation (HCC)    Converted to NSR recent hospitalization 01/2013 on Dilt   Fatigue    Hypercholesterolemia    Hypertension    Obesity    Right bundle branch block    Appears to be chronic   Vertigo     Outpatient Medications Prior to Visit  Medication Sig Dispense Refill   Biotin 10000 MCG TABS Take 5,000 mcg by mouth daily.     carvedilol (COREG) 3.125 MG tablet Take 3.125 mg by mouth daily.     esomeprazole (NEXIUM) 20 MG capsule Take 20 mg by mouth as needed. Reported on 09/07/2015     furosemide (LASIX) 40 MG tablet Take 40 mg by mouth as needed.     levothyroxine (SYNTHROID) 25 MCG tablet Take 25 mcg by mouth daily before breakfast.     MAGNESIUM-ZINC PO Take by mouth.     methocarbamol (ROBAXIN) 500 MG tablet Take 1 tablet (500 mg total) by mouth 2 (two) times daily. 20 tablet 0   potassium chloride (K-DUR) 10 MEQ tablet Take 1 tablet (10 mEq total) by mouth daily. 90 tablet 2   vitamin B-12 (CYANOCOBALAMIN) 1000 MCG tablet Take 1,000 mcg by mouth daily.     NON FORMULARY daily. Sea Kelp 150mg      No facility-administered medications prior to visit.     Objective:     BP 132/78 (BP Location: Right Arm)   Pulse 61   Temp 98.4 F (36.9 C) (Temporal)   Ht 5' 7.5" (1.715 m)   Wt 203 lb 6.4 oz (92.3 kg)   SpO2 100% Comment: ra  BMI 31.39 kg/m   SpO2: 100 % (ra)  Ambulatory wf who failed to answer a single question asked in a straightforward manner, tending to go off on tangents or answer questions with ambiguous medical terms or diagnoses and seemed  genuinely perplexed when asked the same question more than once for clarification.    HEENT : pt wearing mask not removed for exam due to covid -19 concerns.    NECK :  without JVD/Nodes/TM/ nl carotid upstrokes bilaterally   LUNGS: no acc muscle use,  Nl contour chest which is clear to A and P bilaterally without cough on  insp or exp maneuvers   CV:  RRR  no s3 or murmur or increase in P2, and no edema   ABD:  quite obese soft and nontender with nl inspiratory excursion in the supine position. No bruits or organomegaly appreciated, bowel sounds nl  MS:  Nl gait/ ext warm without deformities, calf tenderness, cyanosis or clubbing No obvious joint restrictions   SKIN: warm and dry without lesions    NEURO:  alert, approp, nl sensorium with  no motor or cerebellar deficits apparent.     CXR PA and Lateral:   12/24/2020 :    I personally reviewed images and  agree with radiology impression as follows:   Chronic lung changes/emphysema with no definite evidence of acute cardiopulmonary disease  Labs ordered/ reviewed:      Chemistry      Component Value Date/Time   NA 139 09/01/2018 0514   K 3.4 (L) 09/01/2018 0514   CL 100 09/01/2018 0514   CO2 29 09/01/2018 0514   BUN 17 09/01/2018 0514   CREATININE 0.82 09/01/2018 0514      Component Value Date/Time   CALCIUM 8.2 (L) 09/01/2018 0514   ALKPHOS 95 08/31/2018 1201   AST 31 08/31/2018 1201   ALT 27 08/31/2018 1201   BILITOT 1.5 (H) 08/31/2018 1201        Lab Results  Component Value Date   WBC 5.0 12/24/2020   HGB 12.8 12/24/2020   HCT 39.2 12/24/2020   MCV 89 12/24/2020   PLT 200 12/24/2020      EOS                                                               0.4                                    12/24/2020   Lab Results  Component Value Date   DDIMER 0.44 12/24/2020           BNO  12/24/20  =  211  Labs  10/30/2020 from Dr Juel Burrow office:  nl HC03 ,  creat, tsh           Assessment   No problem-specific Assessment & Plan notes found for this encounter.     Christinia Gully, MD 12/24/2020

## 2020-12-25 ENCOUNTER — Ambulatory Visit (HOSPITAL_COMMUNITY)
Admission: RE | Admit: 2020-12-25 | Discharge: 2020-12-25 | Disposition: A | Discharge status: Home / Self Care | Payer: Medicare HMO | Source: Ambulatory Visit | Attending: Internal Medicine | Admitting: Internal Medicine

## 2020-12-25 DIAGNOSIS — J439 Emphysema, unspecified: Secondary | ICD-10-CM | POA: Diagnosis not present

## 2020-12-25 DIAGNOSIS — R0609 Other forms of dyspnea: Secondary | ICD-10-CM | POA: Insufficient documentation

## 2020-12-29 ENCOUNTER — Encounter: Payer: Self-pay | Admitting: Internal Medicine

## 2020-12-29 LAB — CBC WITH DIFFERENTIAL/PLATELET
Basophils Absolute: 0 10*3/uL (ref 0.0–0.2)
Basos: 1 %
EOS (ABSOLUTE): 0.4 10*3/uL (ref 0.0–0.4)
Eos: 7 %
Hematocrit: 39.2 % (ref 34.0–46.6)
Hemoglobin: 12.8 g/dL (ref 11.1–15.9)
Immature Grans (Abs): 0 10*3/uL (ref 0.0–0.1)
Immature Granulocytes: 0 %
Lymphocytes Absolute: 1.2 10*3/uL (ref 0.7–3.1)
Lymphs: 24 %
MCH: 29.1 pg (ref 26.6–33.0)
MCHC: 32.7 g/dL (ref 31.5–35.7)
MCV: 89 fL (ref 79–97)
Monocytes Absolute: 0.4 10*3/uL (ref 0.1–0.9)
Monocytes: 9 %
Neutrophils Absolute: 3 10*3/uL (ref 1.4–7.0)
Neutrophils: 59 %
Platelets: 200 10*3/uL (ref 150–450)
RBC: 4.4 x10E6/uL (ref 3.77–5.28)
RDW: 13.1 % (ref 11.7–15.4)
WBC: 5 10*3/uL (ref 3.4–10.8)

## 2020-12-29 LAB — ALPHA-1-ANTITRYPSIN PHENOTYP: A-1 Antitrypsin: 158 mg/dL (ref 101–187)

## 2020-12-29 LAB — BRAIN NATRIURETIC PEPTIDE: BNP: 210.6 pg/mL — ABNORMAL HIGH (ref 0.0–100.0)

## 2020-12-29 LAB — D-DIMER, QUANTITATIVE: D-DIMER: 0.44 mg/L FEU (ref 0.00–0.49)

## 2020-12-29 NOTE — Assessment & Plan Note (Addendum)
Onset x 2015 assoc with afib  - Echo 09/01/2018 1. The left ventricle has low normal systolic function, with an ejection  fraction of 50-55%. The cavity size was normal. There is mildly increased  left ventricular wall thickness. Left ventricular diastolic Doppler  parameters are consistent with pseudonormalization.  2. The right ventricle has normal systolic function. The cavity was  normal. There is no increase in right ventricular wall thickness.  3. Left atrial size was mildly dilated.  4. Right atrial size was moderately dilated.  5. No evidence of mitral valve stenosis.  6. No stenosis of the aortic valve.  7. The interatrial septum was not assessed - 12/24/2020 walked 450 ft sob on last 100 ft moderately  fast pace, sats lowest = 97%   ? Whether she could be developing PH related to OSA / ? Adherence to cpap so will start with having her return to establish with sleep doc and make sure not noct desats then proceed with maybe repeat echo/ pfts and in meantime work on reconditioning by having her work her bicycle ergometer up to > 20 min but pacing herself more effectively and doing a cpst on the bicycle if nothing else turns up in the workout  .Each maintenance medication was reviewed in detail including emphasizing most importantly the difference between maintenance and prns and under what circumstances the prns are to be triggered using an action plan format where appropriate.  Total time for H and P, chart review, counseling,  directly observing portions of ambulatory 02 saturation study/ and generating customized AVS unique to this initial  office visit / same day charting  48 min

## 2021-01-01 ENCOUNTER — Telehealth: Payer: Self-pay | Admitting: Internal Medicine

## 2021-01-01 ENCOUNTER — Other Ambulatory Visit: Payer: Self-pay

## 2021-01-01 DIAGNOSIS — R0609 Other forms of dyspnea: Secondary | ICD-10-CM

## 2021-01-01 NOTE — Telephone Encounter (Signed)
Called patient and went over CXR and lab results with her. She voiced understanding about labs but wants more detail on CXR. She states she wants to know if the CXR shows asthma, COPD or any other pulmonary diseases shes been diagnosed with.    Dr. Sherene Sires please advise.

## 2021-01-01 NOTE — Telephone Encounter (Signed)
There are structural changes that suggest copd/ emphysema and needs pfts to sort out severity  -  I had planned to do that anyway after her sleep eval but fine to order it now if she's aggreeable

## 2021-01-01 NOTE — Telephone Encounter (Signed)
Called and spoke to patient. She is agreeable to a PFT but only in [place]. I did let her know that the PFTS in [place] are being scheduled far out at the moment and she voiced understanding. States she will call us in the meantime if her breathing worsens.

## 2021-01-12 DIAGNOSIS — G4733 Obstructive sleep apnea (adult) (pediatric): Secondary | ICD-10-CM | POA: Diagnosis not present

## 2021-01-20 DIAGNOSIS — I10 Essential (primary) hypertension: Secondary | ICD-10-CM | POA: Diagnosis not present

## 2021-01-20 DIAGNOSIS — H5200 Hypermetropia, unspecified eye: Secondary | ICD-10-CM | POA: Diagnosis not present

## 2021-01-20 DIAGNOSIS — E782 Mixed hyperlipidemia: Secondary | ICD-10-CM | POA: Diagnosis not present

## 2021-01-20 DIAGNOSIS — Z0100 Encounter for examination of eyes and vision without abnormal findings: Secondary | ICD-10-CM | POA: Diagnosis not present

## 2021-01-28 DIAGNOSIS — R110 Nausea: Secondary | ICD-10-CM | POA: Diagnosis not present

## 2021-01-28 DIAGNOSIS — R0600 Dyspnea, unspecified: Secondary | ICD-10-CM | POA: Diagnosis not present

## 2021-01-28 DIAGNOSIS — R197 Diarrhea, unspecified: Secondary | ICD-10-CM | POA: Diagnosis not present

## 2021-01-30 DIAGNOSIS — U071 COVID-19: Secondary | ICD-10-CM | POA: Diagnosis not present

## 2021-02-05 DIAGNOSIS — R0600 Dyspnea, unspecified: Secondary | ICD-10-CM | POA: Diagnosis not present

## 2021-02-05 DIAGNOSIS — U071 COVID-19: Secondary | ICD-10-CM | POA: Diagnosis not present

## 2021-02-05 DIAGNOSIS — J069 Acute upper respiratory infection, unspecified: Secondary | ICD-10-CM | POA: Diagnosis not present

## 2021-02-19 DIAGNOSIS — I10 Essential (primary) hypertension: Secondary | ICD-10-CM | POA: Diagnosis not present

## 2021-02-19 DIAGNOSIS — E782 Mixed hyperlipidemia: Secondary | ICD-10-CM | POA: Diagnosis not present

## 2021-03-18 DIAGNOSIS — B3731 Acute candidiasis of vulva and vagina: Secondary | ICD-10-CM | POA: Diagnosis not present

## 2021-03-23 DIAGNOSIS — E785 Hyperlipidemia, unspecified: Secondary | ICD-10-CM | POA: Diagnosis not present

## 2021-03-23 DIAGNOSIS — I10 Essential (primary) hypertension: Secondary | ICD-10-CM | POA: Diagnosis not present

## 2021-04-12 DIAGNOSIS — G4733 Obstructive sleep apnea (adult) (pediatric): Secondary | ICD-10-CM | POA: Diagnosis not present

## 2021-05-10 DIAGNOSIS — M109 Gout, unspecified: Secondary | ICD-10-CM | POA: Diagnosis not present

## 2021-05-10 DIAGNOSIS — E039 Hypothyroidism, unspecified: Secondary | ICD-10-CM | POA: Diagnosis not present

## 2021-05-10 DIAGNOSIS — E782 Mixed hyperlipidemia: Secondary | ICD-10-CM | POA: Diagnosis not present

## 2021-05-10 DIAGNOSIS — G4733 Obstructive sleep apnea (adult) (pediatric): Secondary | ICD-10-CM | POA: Diagnosis not present

## 2021-05-12 DIAGNOSIS — Z79899 Other long term (current) drug therapy: Secondary | ICD-10-CM | POA: Diagnosis not present

## 2021-05-12 DIAGNOSIS — I4819 Other persistent atrial fibrillation: Secondary | ICD-10-CM | POA: Diagnosis not present

## 2021-05-12 DIAGNOSIS — E782 Mixed hyperlipidemia: Secondary | ICD-10-CM | POA: Diagnosis not present

## 2021-05-12 DIAGNOSIS — E039 Hypothyroidism, unspecified: Secondary | ICD-10-CM | POA: Diagnosis not present

## 2021-05-12 DIAGNOSIS — E669 Obesity, unspecified: Secondary | ICD-10-CM | POA: Diagnosis not present

## 2021-05-12 DIAGNOSIS — Z6832 Body mass index (BMI) 32.0-32.9, adult: Secondary | ICD-10-CM | POA: Diagnosis not present

## 2021-05-12 DIAGNOSIS — J449 Chronic obstructive pulmonary disease, unspecified: Secondary | ICD-10-CM | POA: Diagnosis not present

## 2021-05-12 DIAGNOSIS — I5022 Chronic systolic (congestive) heart failure: Secondary | ICD-10-CM | POA: Diagnosis not present

## 2021-05-12 DIAGNOSIS — I10 Essential (primary) hypertension: Secondary | ICD-10-CM | POA: Diagnosis not present

## 2021-05-12 DIAGNOSIS — L659 Nonscarring hair loss, unspecified: Secondary | ICD-10-CM | POA: Diagnosis not present

## 2021-06-10 DIAGNOSIS — G4733 Obstructive sleep apnea (adult) (pediatric): Secondary | ICD-10-CM | POA: Diagnosis not present

## 2021-06-18 DIAGNOSIS — I10 Essential (primary) hypertension: Secondary | ICD-10-CM | POA: Diagnosis not present

## 2021-06-18 DIAGNOSIS — W57XXXA Bitten or stung by nonvenomous insect and other nonvenomous arthropods, initial encounter: Secondary | ICD-10-CM | POA: Diagnosis not present

## 2021-06-20 DIAGNOSIS — E782 Mixed hyperlipidemia: Secondary | ICD-10-CM | POA: Diagnosis not present

## 2021-06-20 DIAGNOSIS — I110 Hypertensive heart disease with heart failure: Secondary | ICD-10-CM | POA: Diagnosis not present

## 2021-06-20 DIAGNOSIS — I5022 Chronic systolic (congestive) heart failure: Secondary | ICD-10-CM | POA: Diagnosis not present

## 2021-06-20 DIAGNOSIS — E039 Hypothyroidism, unspecified: Secondary | ICD-10-CM | POA: Diagnosis not present

## 2021-07-10 DIAGNOSIS — G4733 Obstructive sleep apnea (adult) (pediatric): Secondary | ICD-10-CM | POA: Diagnosis not present

## 2021-09-08 DIAGNOSIS — E039 Hypothyroidism, unspecified: Secondary | ICD-10-CM | POA: Diagnosis not present

## 2021-09-08 DIAGNOSIS — E782 Mixed hyperlipidemia: Secondary | ICD-10-CM | POA: Diagnosis not present

## 2021-09-14 DIAGNOSIS — E669 Obesity, unspecified: Secondary | ICD-10-CM | POA: Diagnosis not present

## 2021-09-14 DIAGNOSIS — I480 Paroxysmal atrial fibrillation: Secondary | ICD-10-CM | POA: Diagnosis not present

## 2021-09-14 DIAGNOSIS — Z6832 Body mass index (BMI) 32.0-32.9, adult: Secondary | ICD-10-CM | POA: Diagnosis not present

## 2021-09-14 DIAGNOSIS — I5022 Chronic systolic (congestive) heart failure: Secondary | ICD-10-CM | POA: Diagnosis not present

## 2021-09-14 DIAGNOSIS — J4520 Mild intermittent asthma, uncomplicated: Secondary | ICD-10-CM | POA: Diagnosis not present

## 2021-09-14 DIAGNOSIS — I10 Essential (primary) hypertension: Secondary | ICD-10-CM | POA: Diagnosis not present

## 2021-09-14 DIAGNOSIS — E039 Hypothyroidism, unspecified: Secondary | ICD-10-CM | POA: Diagnosis not present

## 2021-09-14 DIAGNOSIS — L659 Nonscarring hair loss, unspecified: Secondary | ICD-10-CM | POA: Diagnosis not present

## 2021-09-14 DIAGNOSIS — E782 Mixed hyperlipidemia: Secondary | ICD-10-CM | POA: Diagnosis not present

## 2021-10-06 DIAGNOSIS — I5022 Chronic systolic (congestive) heart failure: Secondary | ICD-10-CM | POA: Diagnosis not present

## 2021-10-06 DIAGNOSIS — I43 Cardiomyopathy in diseases classified elsewhere: Secondary | ICD-10-CM | POA: Diagnosis not present

## 2021-10-06 DIAGNOSIS — I480 Paroxysmal atrial fibrillation: Secondary | ICD-10-CM | POA: Diagnosis not present

## 2021-10-06 DIAGNOSIS — R000 Tachycardia, unspecified: Secondary | ICD-10-CM | POA: Diagnosis not present

## 2021-10-06 DIAGNOSIS — I4819 Other persistent atrial fibrillation: Secondary | ICD-10-CM | POA: Diagnosis not present

## 2021-10-20 DIAGNOSIS — I5022 Chronic systolic (congestive) heart failure: Secondary | ICD-10-CM | POA: Diagnosis not present

## 2021-10-20 DIAGNOSIS — R000 Tachycardia, unspecified: Secondary | ICD-10-CM | POA: Diagnosis not present

## 2021-10-20 DIAGNOSIS — I480 Paroxysmal atrial fibrillation: Secondary | ICD-10-CM | POA: Diagnosis not present

## 2021-10-20 DIAGNOSIS — I43 Cardiomyopathy in diseases classified elsewhere: Secondary | ICD-10-CM | POA: Diagnosis not present

## 2021-11-16 DIAGNOSIS — I5022 Chronic systolic (congestive) heart failure: Secondary | ICD-10-CM | POA: Diagnosis not present

## 2021-11-16 DIAGNOSIS — R0602 Shortness of breath: Secondary | ICD-10-CM | POA: Diagnosis not present

## 2021-11-16 DIAGNOSIS — I429 Cardiomyopathy, unspecified: Secondary | ICD-10-CM | POA: Diagnosis not present

## 2021-12-09 DIAGNOSIS — L500 Allergic urticaria: Secondary | ICD-10-CM | POA: Diagnosis not present

## 2021-12-13 DIAGNOSIS — H52223 Regular astigmatism, bilateral: Secondary | ICD-10-CM | POA: Diagnosis not present

## 2021-12-13 DIAGNOSIS — H524 Presbyopia: Secondary | ICD-10-CM | POA: Diagnosis not present

## 2021-12-15 DIAGNOSIS — G4733 Obstructive sleep apnea (adult) (pediatric): Secondary | ICD-10-CM | POA: Diagnosis not present

## 2021-12-22 DIAGNOSIS — I428 Other cardiomyopathies: Secondary | ICD-10-CM | POA: Diagnosis not present

## 2021-12-22 DIAGNOSIS — I110 Hypertensive heart disease with heart failure: Secondary | ICD-10-CM | POA: Diagnosis not present

## 2021-12-22 DIAGNOSIS — I480 Paroxysmal atrial fibrillation: Secondary | ICD-10-CM | POA: Diagnosis not present

## 2021-12-22 DIAGNOSIS — A6920 Lyme disease, unspecified: Secondary | ICD-10-CM | POA: Diagnosis not present

## 2021-12-22 DIAGNOSIS — I5022 Chronic systolic (congestive) heart failure: Secondary | ICD-10-CM | POA: Diagnosis not present

## 2021-12-22 DIAGNOSIS — I10 Essential (primary) hypertension: Secondary | ICD-10-CM | POA: Diagnosis not present

## 2022-01-15 DIAGNOSIS — G4733 Obstructive sleep apnea (adult) (pediatric): Secondary | ICD-10-CM | POA: Diagnosis not present

## 2022-01-19 DIAGNOSIS — M25562 Pain in left knee: Secondary | ICD-10-CM | POA: Diagnosis not present

## 2022-01-19 DIAGNOSIS — Z888 Allergy status to other drugs, medicaments and biological substances status: Secondary | ICD-10-CM | POA: Diagnosis not present

## 2022-01-19 DIAGNOSIS — Z8679 Personal history of other diseases of the circulatory system: Secondary | ICD-10-CM | POA: Diagnosis not present

## 2022-01-19 DIAGNOSIS — I5022 Chronic systolic (congestive) heart failure: Secondary | ICD-10-CM | POA: Diagnosis not present

## 2022-01-19 DIAGNOSIS — D509 Iron deficiency anemia, unspecified: Secondary | ICD-10-CM | POA: Diagnosis not present

## 2022-01-19 DIAGNOSIS — R69 Illness, unspecified: Secondary | ICD-10-CM | POA: Diagnosis not present

## 2022-01-19 DIAGNOSIS — Z113 Encounter for screening for infections with a predominantly sexual mode of transmission: Secondary | ICD-10-CM | POA: Diagnosis not present

## 2022-01-19 DIAGNOSIS — G4730 Sleep apnea, unspecified: Secondary | ICD-10-CM | POA: Diagnosis not present

## 2022-02-14 DIAGNOSIS — G4733 Obstructive sleep apnea (adult) (pediatric): Secondary | ICD-10-CM | POA: Diagnosis not present

## 2022-03-11 DIAGNOSIS — E782 Mixed hyperlipidemia: Secondary | ICD-10-CM | POA: Diagnosis not present

## 2022-03-11 DIAGNOSIS — E039 Hypothyroidism, unspecified: Secondary | ICD-10-CM | POA: Diagnosis not present

## 2022-03-17 DIAGNOSIS — Z0001 Encounter for general adult medical examination with abnormal findings: Secondary | ICD-10-CM | POA: Diagnosis not present

## 2022-03-17 DIAGNOSIS — I480 Paroxysmal atrial fibrillation: Secondary | ICD-10-CM | POA: Diagnosis not present

## 2022-03-17 DIAGNOSIS — L659 Nonscarring hair loss, unspecified: Secondary | ICD-10-CM | POA: Diagnosis not present

## 2022-03-17 DIAGNOSIS — I5022 Chronic systolic (congestive) heart failure: Secondary | ICD-10-CM | POA: Diagnosis not present

## 2022-03-17 DIAGNOSIS — E669 Obesity, unspecified: Secondary | ICD-10-CM | POA: Diagnosis not present

## 2022-03-17 DIAGNOSIS — E039 Hypothyroidism, unspecified: Secondary | ICD-10-CM | POA: Diagnosis not present

## 2022-03-17 DIAGNOSIS — E559 Vitamin D deficiency, unspecified: Secondary | ICD-10-CM | POA: Diagnosis not present

## 2022-03-17 DIAGNOSIS — M25562 Pain in left knee: Secondary | ICD-10-CM | POA: Diagnosis not present

## 2022-03-17 DIAGNOSIS — I10 Essential (primary) hypertension: Secondary | ICD-10-CM | POA: Diagnosis not present

## 2022-03-17 DIAGNOSIS — E870 Hyperosmolality and hypernatremia: Secondary | ICD-10-CM | POA: Diagnosis not present

## 2022-03-17 DIAGNOSIS — E782 Mixed hyperlipidemia: Secondary | ICD-10-CM | POA: Diagnosis not present

## 2022-03-17 DIAGNOSIS — J4520 Mild intermittent asthma, uncomplicated: Secondary | ICD-10-CM | POA: Diagnosis not present

## 2022-06-02 DIAGNOSIS — M25462 Effusion, left knee: Secondary | ICD-10-CM | POA: Diagnosis not present

## 2022-06-02 DIAGNOSIS — I10 Essential (primary) hypertension: Secondary | ICD-10-CM | POA: Diagnosis not present

## 2022-07-29 DIAGNOSIS — S31153A Open bite of abdominal wall, right lower quadrant without penetration into peritoneal cavity, initial encounter: Secondary | ICD-10-CM | POA: Diagnosis not present

## 2022-07-29 DIAGNOSIS — J45909 Unspecified asthma, uncomplicated: Secondary | ICD-10-CM | POA: Diagnosis not present

## 2022-07-29 DIAGNOSIS — W57XXXA Bitten or stung by nonvenomous insect and other nonvenomous arthropods, initial encounter: Secondary | ICD-10-CM | POA: Diagnosis not present

## 2022-08-17 DIAGNOSIS — R5383 Other fatigue: Secondary | ICD-10-CM | POA: Diagnosis not present

## 2022-08-17 DIAGNOSIS — R110 Nausea: Secondary | ICD-10-CM | POA: Diagnosis not present

## 2022-08-17 DIAGNOSIS — S40862D Insect bite (nonvenomous) of left upper arm, subsequent encounter: Secondary | ICD-10-CM | POA: Diagnosis not present

## 2022-08-17 DIAGNOSIS — W57XXXA Bitten or stung by nonvenomous insect and other nonvenomous arthropods, initial encounter: Secondary | ICD-10-CM | POA: Diagnosis not present

## 2022-08-17 DIAGNOSIS — W57XXXD Bitten or stung by nonvenomous insect and other nonvenomous arthropods, subsequent encounter: Secondary | ICD-10-CM | POA: Diagnosis not present

## 2022-08-31 DIAGNOSIS — L293 Anogenital pruritus, unspecified: Secondary | ICD-10-CM | POA: Diagnosis not present

## 2022-08-31 DIAGNOSIS — R319 Hematuria, unspecified: Secondary | ICD-10-CM | POA: Diagnosis not present

## 2022-08-31 DIAGNOSIS — N390 Urinary tract infection, site not specified: Secondary | ICD-10-CM | POA: Diagnosis not present

## 2022-08-31 DIAGNOSIS — L659 Nonscarring hair loss, unspecified: Secondary | ICD-10-CM | POA: Diagnosis not present

## 2022-09-03 ENCOUNTER — Observation Stay (HOSPITAL_BASED_OUTPATIENT_CLINIC_OR_DEPARTMENT_OTHER): Payer: Medicare HMO

## 2022-09-03 ENCOUNTER — Observation Stay (HOSPITAL_COMMUNITY)
Admission: EM | Admit: 2022-09-03 | Discharge: 2022-09-04 | Disposition: A | Discharge status: Home / Self Care | Payer: Medicare HMO | Attending: Internal Medicine | Admitting: Internal Medicine

## 2022-09-03 ENCOUNTER — Encounter (HOSPITAL_COMMUNITY): Payer: Self-pay | Admitting: Emergency Medicine

## 2022-09-03 ENCOUNTER — Emergency Department (HOSPITAL_COMMUNITY): Payer: Medicare HMO

## 2022-09-03 ENCOUNTER — Other Ambulatory Visit: Payer: Self-pay

## 2022-09-03 DIAGNOSIS — E669 Obesity, unspecified: Secondary | ICD-10-CM | POA: Diagnosis not present

## 2022-09-03 DIAGNOSIS — J811 Chronic pulmonary edema: Secondary | ICD-10-CM | POA: Diagnosis not present

## 2022-09-03 DIAGNOSIS — Z6830 Body mass index (BMI) 30.0-30.9, adult: Secondary | ICD-10-CM | POA: Diagnosis not present

## 2022-09-03 DIAGNOSIS — I480 Paroxysmal atrial fibrillation: Secondary | ICD-10-CM | POA: Diagnosis not present

## 2022-09-03 DIAGNOSIS — I5033 Acute on chronic diastolic (congestive) heart failure: Secondary | ICD-10-CM | POA: Diagnosis present

## 2022-09-03 DIAGNOSIS — F32A Depression, unspecified: Secondary | ICD-10-CM | POA: Diagnosis present

## 2022-09-03 DIAGNOSIS — J9601 Acute respiratory failure with hypoxia: Principal | ICD-10-CM | POA: Diagnosis present

## 2022-09-03 DIAGNOSIS — I10 Essential (primary) hypertension: Secondary | ICD-10-CM | POA: Diagnosis present

## 2022-09-03 DIAGNOSIS — J432 Centrilobular emphysema: Secondary | ICD-10-CM | POA: Diagnosis not present

## 2022-09-03 DIAGNOSIS — I110 Hypertensive heart disease with heart failure: Secondary | ICD-10-CM | POA: Diagnosis not present

## 2022-09-03 DIAGNOSIS — E039 Hypothyroidism, unspecified: Secondary | ICD-10-CM | POA: Insufficient documentation

## 2022-09-03 DIAGNOSIS — I509 Heart failure, unspecified: Secondary | ICD-10-CM | POA: Diagnosis not present

## 2022-09-03 DIAGNOSIS — R918 Other nonspecific abnormal finding of lung field: Secondary | ICD-10-CM | POA: Diagnosis not present

## 2022-09-03 DIAGNOSIS — I5031 Acute diastolic (congestive) heart failure: Secondary | ICD-10-CM | POA: Diagnosis not present

## 2022-09-03 DIAGNOSIS — I517 Cardiomegaly: Secondary | ICD-10-CM | POA: Diagnosis not present

## 2022-09-03 DIAGNOSIS — I5020 Unspecified systolic (congestive) heart failure: Principal | ICD-10-CM

## 2022-09-03 DIAGNOSIS — R0602 Shortness of breath: Secondary | ICD-10-CM | POA: Diagnosis not present

## 2022-09-03 DIAGNOSIS — Z79899 Other long term (current) drug therapy: Secondary | ICD-10-CM | POA: Diagnosis not present

## 2022-09-03 LAB — URINALYSIS, W/ REFLEX TO CULTURE (INFECTION SUSPECTED)
Bacteria, UA: NONE SEEN
Bilirubin Urine: NEGATIVE
Glucose, UA: NEGATIVE mg/dL
Ketones, ur: NEGATIVE mg/dL
Leukocytes,Ua: NEGATIVE
Nitrite: NEGATIVE
Protein, ur: NEGATIVE mg/dL
Specific Gravity, Urine: 1.006 (ref 1.005–1.030)
pH: 5 (ref 5.0–8.0)

## 2022-09-03 LAB — BASIC METABOLIC PANEL
Anion gap: 8 (ref 5–15)
BUN: 11 mg/dL (ref 8–23)
CO2: 27 mmol/L (ref 22–32)
Calcium: 8.7 mg/dL — ABNORMAL LOW (ref 8.9–10.3)
Chloride: 101 mmol/L (ref 98–111)
Creatinine, Ser: 0.69 mg/dL (ref 0.44–1.00)
GFR, Estimated: >60 mL/min (ref >60)
Glucose, Bld: 112 mg/dL — ABNORMAL HIGH (ref 70–99)
Potassium: 3.5 mmol/L (ref 3.5–5.1)
Sodium: 136 mmol/L (ref 135–145)

## 2022-09-03 LAB — CBC WITH DIFFERENTIAL/PLATELET
Abs Immature Granulocytes: 0.01 10*3/uL (ref 0.00–0.07)
Basophils Absolute: 0 10*3/uL (ref 0.0–0.1)
Basophils Relative: 1 %
Eosinophils Absolute: 0.5 10*3/uL (ref 0.0–0.5)
Eosinophils Relative: 5 %
HCT: 37.2 % (ref 36.0–46.0)
Hemoglobin: 12.3 g/dL (ref 12.0–15.0)
Immature Granulocytes: 0 %
Lymphocytes Relative: 8 %
Lymphs Abs: 0.7 10*3/uL (ref 0.7–4.0)
MCH: 29.4 pg (ref 26.0–34.0)
MCHC: 33.1 g/dL (ref 30.0–36.0)
MCV: 89 fL (ref 80.0–100.0)
Monocytes Absolute: 0.6 10*3/uL (ref 0.1–1.0)
Monocytes Relative: 7 %
Neutro Abs: 6.9 10*3/uL (ref 1.7–7.7)
Neutrophils Relative %: 79 %
Platelets: 172 10*3/uL (ref 150–400)
RBC: 4.18 MIL/uL (ref 3.87–5.11)
RDW: 13.6 % (ref 11.5–15.5)
WBC: 8.6 10*3/uL (ref 4.0–10.5)
nRBC: 0 % (ref 0.0–0.2)

## 2022-09-03 LAB — BRAIN NATRIURETIC PEPTIDE: B Natriuretic Peptide: 513 pg/mL — ABNORMAL HIGH (ref 0.0–100.0)

## 2022-09-03 LAB — ECHOCARDIOGRAM COMPLETE
Area-P 1/2: 3.12 cm2
Calc EF: 53.3 %
Height: 67 in
MV M vel: 5.3 m/s
MV Peak grad: 112.4 mmHg
Radius: 0.4 cm
S' Lateral: 3.2 cm
Single Plane A2C EF: 50.3 %
Single Plane A4C EF: 56.9 %
Weight: 3086.44 oz

## 2022-09-03 LAB — HEPATIC FUNCTION PANEL
ALT: 18 U/L (ref 0–44)
AST: 23 U/L (ref 15–41)
Albumin: 3.7 g/dL (ref 3.5–5.0)
Alkaline Phosphatase: 78 U/L (ref 38–126)
Bilirubin, Direct: 0.3 mg/dL — ABNORMAL HIGH (ref 0.0–0.2)
Indirect Bilirubin: 1.8 mg/dL — ABNORMAL HIGH (ref 0.3–0.9)
Total Bilirubin: 2.1 mg/dL — ABNORMAL HIGH (ref 0.3–1.2)
Total Protein: 7.2 g/dL (ref 6.5–8.1)

## 2022-09-03 LAB — TROPONIN I (HIGH SENSITIVITY)
Troponin I (High Sensitivity): 28 ng/L — ABNORMAL HIGH (ref <18)
Troponin I (High Sensitivity): 27 ng/L — ABNORMAL HIGH (ref <18)

## 2022-09-03 MED ORDER — LEVOTHYROXINE SODIUM 50 MCG PO TABS
25.0000 ug | ORAL_TABLET | Freq: Every day | ORAL | Status: DC
  Administered 2022-09-04: 25 ug via ORAL
  Filled 2022-09-03: qty 1

## 2022-09-03 MED ORDER — SODIUM CHLORIDE 0.9% FLUSH: 3.0000 mL | INTRAVENOUS | Status: DC | PRN

## 2022-09-03 MED ORDER — ONDANSETRON HCL 4 MG/2ML IJ SOLN: 4.0000 mg | Freq: Four times a day (QID) | INTRAMUSCULAR | Status: DC | PRN

## 2022-09-03 MED ORDER — METHYLPREDNISOLONE SODIUM SUCC 125 MG IJ SOLR
125.0000 mg | Freq: Once | INTRAMUSCULAR | Status: AC
  Administered 2022-09-03: 125 mg via INTRAVENOUS
  Filled 2022-09-03: qty 2

## 2022-09-03 MED ORDER — SODIUM CHLORIDE 0.9% FLUSH
3.0000 mL | Freq: Two times a day (BID) | INTRAVENOUS | Status: DC
  Administered 2022-09-03 – 2022-09-04 (×3): 3 mL via INTRAVENOUS

## 2022-09-03 MED ORDER — ENOXAPARIN SODIUM 40 MG/0.4ML IJ SOSY
40.0000 mg | PREFILLED_SYRINGE | INTRAMUSCULAR | Status: DC
  Administered 2022-09-03: 40 mg via SUBCUTANEOUS
  Filled 2022-09-03: qty 0.4

## 2022-09-03 MED ORDER — IOHEXOL 300 MG/ML  SOLN
75.0000 mL | Freq: Once | INTRAMUSCULAR | Status: AC | PRN
  Administered 2022-09-03: 75 mL via INTRAVENOUS

## 2022-09-03 MED ORDER — ALBUTEROL SULFATE (2.5 MG/3ML) 0.083% IN NEBU
2.5000 mg | INHALATION_SOLUTION | Freq: Once | RESPIRATORY_TRACT | Status: AC
Start: 2022-09-03 — End: 2022-09-03
  Administered 2022-09-03: 2.5 mg via RESPIRATORY_TRACT
  Filled 2022-09-03: qty 3

## 2022-09-03 MED ORDER — IPRATROPIUM-ALBUTEROL 0.5-2.5 (3) MG/3ML IN SOLN
3.0000 mL | Freq: Once | RESPIRATORY_TRACT | Status: AC
  Administered 2022-09-03: 3 mL via RESPIRATORY_TRACT
  Filled 2022-09-03: qty 3

## 2022-09-03 MED ORDER — ACETAMINOPHEN 325 MG PO TABS: 650.0000 mg | ORAL_TABLET | ORAL | Status: DC | PRN

## 2022-09-03 MED ORDER — FUROSEMIDE 10 MG/ML IJ SOLN
40.0000 mg | Freq: Two times a day (BID) | INTRAMUSCULAR | Status: DC
  Administered 2022-09-03 – 2022-09-04 (×2): 40 mg via INTRAVENOUS
  Filled 2022-09-03 (×2): qty 4

## 2022-09-03 MED ORDER — FUROSEMIDE 10 MG/ML IJ SOLN
40.0000 mg | Freq: Once | INTRAMUSCULAR | Status: AC
  Administered 2022-09-03: 40 mg via INTRAVENOUS
  Filled 2022-09-03: qty 4

## 2022-09-03 MED ORDER — ALBUTEROL SULFATE HFA 108 (90 BASE) MCG/ACT IN AERS: 2.0000 | INHALATION_SPRAY | RESPIRATORY_TRACT | Status: DC | PRN

## 2022-09-03 MED ORDER — MAGNESIUM SULFATE 2 GM/50ML IV SOLN
2.0000 g | Freq: Once | INTRAVENOUS | Status: AC
  Administered 2022-09-03: 2 g via INTRAVENOUS
  Filled 2022-09-03: qty 50

## 2022-09-03 MED ORDER — CARVEDILOL 3.125 MG PO TABS
3.1250 mg | ORAL_TABLET | Freq: Every day | ORAL | Status: DC
  Administered 2022-09-03 – 2022-09-04 (×2): 3.125 mg via ORAL
  Filled 2022-09-03 (×2): qty 1

## 2022-09-03 MED ORDER — SODIUM CHLORIDE 0.9 % IV SOLN: 250.0000 mL | INTRAVENOUS | Status: DC | PRN

## 2022-09-03 NOTE — H&P (Signed)
History and Physical    Lindsay Whitehead:[id number] DOB: 19-Mar-1945 DOA: 09/03/2022  PCP: Benita Stabile, MD   Patient coming from: Home  Chief Complaint: Shortness of breath  HPI: Lindsay Whitehead is a 77 y.o. female with medical history significant for paroxysmal atrial fibrillation/flutter, diastolic CHF, hypertension, dyslipidemia, obesity, and GERD who presented to the ED with 3 days worth of shortness of breath.  She was noted to have pulse ox in the 80th percentile requiring 2 L nasal cannula oxygen.  She does check her weight daily and has gained approximately 6 pounds over the last 1 week and takes Lasix as needed.  She claims to have had Lyme disease approximately 3-4 weeks ago and is completed antibiotics for this but states that she has had some mild persistent symptoms.  Additionally she was recently diagnosed with UTI and did not receive final culture results however she was placed on nitrofurantoin and has had issues with nausea and vomiting as well as tremors related to this medication.  She still feels as though she might have a mild UTI.  She does claim to have some orthopnea and dyspnea on exertion, but denies any fevers, chills, or cough.   ED Course: Vital signs stable and patient is afebrile.  Troponin with flat trend noted and BNP 513.  She is requiring 2 L nasal cannula oxygen.  CT chest with some cardiomegaly and signs of heart failure noted.  She was started on some IV Lasix as well as DuoNebs and breathing treatments.  Review of Systems: Reviewed as noted above, otherwise negative.  Past Medical History:  Diagnosis Date   Atrial flutter (HCC)    Diastolic CHF (HCC)    Episodic atrial fibrillation (HCC)    Converted to NSR recent hospitalization 01/2013 on Dilt   Fatigue    Hypercholesterolemia    Hypertension    Obesity    Right bundle branch block    Appears to be chronic   Vertigo     Past Surgical History:  Procedure Laterality Date   carpel tunnel  surgery     TONSILLECTOMY     WRIST FRACTURE SURGERY       reports that she has never smoked. She has never used smokeless tobacco. She reports that she does not drink alcohol and does not use drugs.  Allergies  Allergen Reactions   Bystolic [Nebivolol Hcl] Other (See Comments)    Extreme chest pains/ lowering of blood pressure causing dizziness, falling   Metoprolol Other (See Comments)    Extreme drop in blood pressure, vertigo resulted   Asa [Aspirin]     Bleeding gums   Diltiazem     bleeding gums, stomach ache,weekness    Edarbi [Azilsartan] Hives   Zantac [Ranitidine Hcl]     Family History  Problem Relation Age of Onset   Asthma Mother    Hypertension Mother    COPD Father    Cancer Other    Cardiomyopathy Brother        Congenital with need for heart transplant. Died at 73.    Prior to Admission medications   Medication Sig Start Date End Date Taking? Authorizing Provider  Biotin 81191 MCG TABS Take 5,000 mcg by mouth daily.    [provider]  carvedilol (COREG) 3.125 MG tablet Take 3.125 mg by mouth daily.    [provider]  esomeprazole (NEXIUM) 20 MG capsule Take 20 mg by mouth as needed. Reported on 09/07/2015    [provider]  furosemide (LASIX) 40 MG tablet Take 40 mg by mouth as needed.    [provider]  levothyroxine (SYNTHROID) 25 MCG tablet Take 25 mcg by mouth daily before breakfast.    [provider]  MAGNESIUM-ZINC PO Take by mouth.    [provider]  methocarbamol (ROBAXIN) 500 MG tablet Take 1 tablet (500 mg total) by mouth 2 (two) times daily. 05/15/20   Graciella Freer A, PA-C  potassium chloride (K-DUR) 10 MEQ tablet Take 1 tablet (10 mEq total) by mouth daily. 09/18/15   Marinus Maw, MD  vitamin B-12 (CYANOCOBALAMIN) 1000 MCG tablet Take 1,000 mcg by mouth daily.    [provider]    Physical Exam: Vitals:   09/03/22 0745 09/03/22 0820 09/03/22 0900 09/03/22 1001  BP:  132/85  (!) 136/56 (!) 165/66  Pulse: (!) 55  64 74  Resp: 18  (!) 21 20  Temp:      TempSrc:      SpO2: 96% 97% 94% 91%  Weight:      Height:        Constitutional: NAD, calm, comfortable Vitals:   09/03/22 0745 09/03/22 0820 09/03/22 0900 09/03/22 1001  BP: 132/85  (!) 136/56 (!) 165/66  Pulse: (!) 55  64 74  Resp: 18  (!) 21 20  Temp:      TempSrc:      SpO2: 96% 97% 94% 91%  Weight:      Height:       Eyes: lids and conjunctivae normal Neck: normal, supple Respiratory: clear to auscultation bilaterally. Normal respiratory effort. No accessory muscle use.  2 L nasal cannula, no wheezing noted Cardiovascular: Regular rate and rhythm, no murmurs. Abdomen: no tenderness, no distention. Bowel sounds positive.  Musculoskeletal:  No edema. Skin: no rashes, lesions, ulcers.  Psychiatric: Flat affect  Labs on Admission: I have personally reviewed following labs and imaging studies  CBC: Recent Labs  Lab 09/03/22 0645  WBC 8.6  NEUTROABS 6.9  HGB 12.3  HCT 37.2  MCV 89.0  PLT 172   Basic Metabolic Panel: Recent Labs  Lab 09/03/22 0645  NA 136  K 3.5  CL 101  CO2 27  GLUCOSE 112*  BUN 11  CREATININE 0.69  CALCIUM 8.7*   GFR: Estimated Creatinine Clearance: 67.4 mL/min (by C-G formula based on SCr of 0.69 mg/dL). Liver Function Tests: Recent Labs  Lab 09/03/22 0708  AST 23  ALT 18  ALKPHOS 78  BILITOT 2.1*  PROT 7.2  ALBUMIN 3.7   No results for input(s): "LIPASE", "AMYLASE" in the last 168 hours. No results for input(s): "AMMONIA" in the last 168 hours. Coagulation Profile: No results for input(s): "INR", "PROTIME" in the last 168 hours. Cardiac Enzymes: No results for input(s): "CKTOTAL", "CKMB", "CKMBINDEX", "TROPONINI" in the last 168 hours. BNP (last 3 results) No results for input(s): "PROBNP" in the last 8760 hours. HbA1C: No results for input(s): "HGBA1C" in the last 72 hours. CBG: No results for input(s): "GLUCAP" in the last 168  hours. Lipid Profile: No results for input(s): "CHOL", "HDL", "LDLCALC", "TRIG", "CHOLHDL", "LDLDIRECT" in the last 72 hours. Thyroid Function Tests: No results for input(s): "TSH", "T4TOTAL", "FREET4", "T3FREE", "THYROIDAB" in the last 72 hours. Anemia Panel: No results for input(s): "VITAMINB12", "FOLATE", "FERRITIN", "TIBC", "IRON", "RETICCTPCT" in the last 72 hours. Urine analysis:    Component Value Date/Time   COLORURINE AMBER (A) 08/31/2018 1440   APPEARANCEUR HAZY (A) 08/31/2018 1440   LABSPEC 1.027 08/31/2018 1440  PHURINE 5.0 08/31/2018 1440   GLUCOSEU NEGATIVE 08/31/2018 1440   HGBUR MODERATE (A) 08/31/2018 1440   BILIRUBINUR NEGATIVE 08/31/2018 1440   KETONESUR 5 (A) 08/31/2018 1440   PROTEINUR NEGATIVE 08/31/2018 1440   UROBILINOGEN 0.2 07/18/2014 1158   NITRITE NEGATIVE 08/31/2018 1440   LEUKOCYTESUR NEGATIVE 08/31/2018 1440    Radiological Exams on Admission: CT Chest W Contrast  Result Date: 09/03/2022 CLINICAL DATA:  Shortness of breath for the past 3 days. EXAM: CT CHEST WITH CONTRAST TECHNIQUE: Multidetector CT imaging of the chest was performed during intravenous contrast administration. RADIATION DOSE REDUCTION: This exam was performed according to the departmental dose-optimization program which includes automated exposure control, adjustment of the mA and/or kV according to patient size and/or use of iterative reconstruction technique. CONTRAST:  75mL OMNIPAQUE IOHEXOL 300 MG/ML  SOLN COMPARISON:  Chest x-ray from same day. CT chest dated November 17, 2014. FINDINGS: Cardiovascular: Mild cardiomegaly. No pericardial effusion. No thoracic aortic aneurysm or dissection. Coronary, aortic arch, and branch vessel atherosclerotic vascular disease. No central pulmonary embolism. Mediastinum/Nodes: Prominent mediastinal and right hilar lymph nodes measuring up to 1.3 cm in short axis, likely reactive. No enlarged axillary or left hilar lymph nodes. Thyroid gland, trachea,  and esophagus demonstrate no significant findings. Lungs/Pleura: Trace to small pleural effusions, greater on the left. Scattered smooth interlobular septal thickening. Mild centrilobular emphysema. Subsegmental atelectasis in the lingula and medial right middle lobe. No consolidation or pneumothorax. No suspicious pulmonary nodule. Upper Abdomen: No acute abnormality. Musculoskeletal: No chest wall abnormality. No acute or significant osseous findings. IMPRESSION: 1. Mild congestive heart failure. 2. Aortic Atherosclerosis (ICD10-I70.0) and Emphysema (ICD10-J43.9). Electronically Signed   By: Obie Dredge M.D.   On: 09/03/2022 08:57   DG Chest Portable 1 View  Result Date: 09/03/2022 CLINICAL DATA:  77 year old female with shortness of breath not responding to albuterol. On home oxygen. EXAM: PORTABLE CHEST 1 VIEW COMPARISON:  Chest radiographs 12/25/2020 and earlier. FINDINGS: Portable AP view at 0707 hours. Mild cardiomegaly is stable. Other mediastinal contours are within normal limits. Visualized tracheal air column is within normal limits. Increased interstitial lung markings, symmetric and mildly increased compared to 2022. Diffuse septal thickening on prior 2016 chest CTA. Questionable small left pleural effusion now, as present at that time. No pneumothorax or consolidation. No acute osseous abnormality identified. Negative visible bowel gas. IMPRESSION: Cardiomegaly with symmetric pulmonary interstitial opacity and possible small left pleural effusion. Consider Acute Interstitial Edema. Viral/atypical respiratory infection or chronic interstitial lung disease felt less likely. Electronically Signed   By: Odessa Fleming M.D.   On: 09/03/2022 07:19    EKG: Independently reviewed. SR 72bpm.  Assessment/Plan Principal Problem:   Acute hypoxemic respiratory failure (HCC) Active Problems:   Depression   HTN (hypertension)   Acute on chronic diastolic (congestive) heart failure (HCC)    Acute on  chronic hypoxemic respiratory failure secondary to acute diastolic CHF exacerbation -Started on IV Lasix 40 mg twice daily and monitor strict I's and O's as well as daily weights -Plan to obtain 2D echocardiogram with prior on 08/2018 with LVEF 55-60% -Wean oxygen as tolerated -Maintain on Coreg and monitor blood pressures -Reds clip protocol  RMSF -Completed antibiotics several weeks ago  Questionable UTI -Check urine analysis/culture -Recent dosing of nitrofurantoin  GERD -PPI  Hypothyroidism -Maintain on levothyroxine  Obesity -BMI 30.70  DVT prophylaxis: Lovenox Code Status: Full Family Communication: None at bedside, lives with husband Disposition Plan: Admit for diuresis Consults called: None Admission status: Observation, telemetry  Severity of Illness: The appropriate patient status for this patient is OBSERVATION. Observation status is judged to be reasonable and necessary in order to provide the required intensity of service to ensure the patient's safety. The patient's presenting symptoms, physical exam findings, and initial radiographic and laboratory data in the context of their medical condition is felt to place them at decreased risk for further clinical deterioration. Furthermore, it is anticipated that the patient will be medically stable for discharge from the hospital within 2 midnights of admission.    Lashia Niese D Sherryll Burger DO [facility]  If 7PM-7AM, please contact night-coverage [email or web address]  09/03/2022, 10:40 AM

## 2022-09-03 NOTE — Progress Notes (Signed)
   09/03/22 1517  TOC Brief Assessment  Insurance and Status Reviewed  Patient has primary care physician Yes  Home environment has been reviewed From home with spouse  Prior level of function: Independent  Prior/Current Home Services No current home services  Social Determinants of Health Reivew SDOH reviewed no interventions necessary  Readmission risk has been reviewed Yes  Transition of care needs no transition of care needs at this time   CHF information added to AVS.

## 2022-09-03 NOTE — Progress Notes (Signed)
  Echocardiogram 2D Echocardiogram has been performed.  Lindsay Whitehead 09/03/2022, 2:47 PM

## 2022-09-03 NOTE — ED Triage Notes (Signed)
Pt with c/o SOB x 3 days. States she tried her Albuterol inhaler but that it "did not do anything". Pt also states she wears O2 @ 2l prn but did not have any on when she arrived. O2 sats were 90% on R/A upon arrival to ED. Pt also states that she was diagnosed with [facility] Spotted Fever recently.

## 2022-09-03 NOTE — ED Provider Notes (Signed)
[place] EMERGENCY DEPARTMENT AT [facility] Provider Note   CSN: [id number] Arrival date & time: 09/03/22  4782     History  Chief Complaint  Patient presents with   Shortness of Breath    Lindsay Whitehead is a 77 y.o. female.  Patient complains of shortness of breath.  She has a history of heart failure and COPD.  She states that her sats on room air at home have been running about 86%  The history is provided by the patient and medical records. No language interpreter was used.  Shortness of Breath Severity:  Moderate Onset quality:  Sudden Timing:  Constant Progression:  Worsening Chronicity:  New Context: activity   Relieved by:  Nothing Worsened by:  Nothing Ineffective treatments:  None tried Associated symptoms: no abdominal pain, no chest pain, no cough, no headaches and no rash        Home Medications Prior to Admission medications   Medication Sig Start Date End Date Taking? Authorizing Provider  Biotin 95621 MCG TABS Take 5,000 mcg by mouth daily.    [provider]  carvedilol (COREG) 3.125 MG tablet Take 3.125 mg by mouth daily.    [provider]  esomeprazole (NEXIUM) 20 MG capsule Take 20 mg by mouth as needed. Reported on 09/07/2015    [provider]  furosemide (LASIX) 40 MG tablet Take 40 mg by mouth as needed.    [provider]  levothyroxine (SYNTHROID) 25 MCG tablet Take 25 mcg by mouth daily before breakfast.    [provider]  MAGNESIUM-ZINC PO Take by mouth.    [provider]  methocarbamol (ROBAXIN) 500 MG tablet Take 1 tablet (500 mg total) by mouth 2 (two) times daily. 05/15/20   Graciella Freer A, PA-C  potassium chloride (K-DUR) 10 MEQ tablet Take 1 tablet (10 mEq total) by mouth daily. 09/18/15   Marinus Maw, MD  vitamin B-12 (CYANOCOBALAMIN) 1000 MCG tablet Take 1,000 mcg by mouth daily.    [provider]      Allergies    Bystolic [nebivolol hcl],  Metoprolol, Asa [aspirin], Diltiazem, Edarbi [azilsartan], Lasix [furosemide], and Zantac [ranitidine hcl]    Review of Systems   Review of Systems  Constitutional:  Negative for appetite change and fatigue.  HENT:  Negative for congestion, ear discharge and sinus pressure.   Eyes:  Negative for discharge.  Respiratory:  Positive for shortness of breath. Negative for cough.   Cardiovascular:  Negative for chest pain.  Gastrointestinal:  Negative for abdominal pain and diarrhea.  Genitourinary:  Negative for frequency and hematuria.  Musculoskeletal:  Negative for back pain.  Skin:  Negative for rash.  Neurological:  Negative for seizures and headaches.  Psychiatric/Behavioral:  Negative for hallucinations.     Physical Exam Updated Vital Signs BP 132/85   Pulse (!) 55   Temp 98.1 F (36.7 C) (Oral)   Resp 18   Ht 5\' 7"  (1.702 m)   Wt 88.9 kg   SpO2 97%   BMI 30.70 kg/m  Physical Exam Vitals and nursing note reviewed.  Constitutional:      Appearance: She is well-developed.  HENT:     Head: Normocephalic.     Nose: Nose normal.  Eyes:     General: No scleral icterus.    Conjunctiva/sclera: Conjunctivae normal.  Neck:     Thyroid: No thyromegaly.  Cardiovascular:     Rate and Rhythm: Normal rate and regular rhythm.  Heart sounds: No murmur heard.    No friction rub. No gallop.  Pulmonary:     Breath sounds: No stridor. No wheezing or rales.  Chest:     Chest wall: No tenderness.  Abdominal:     General: There is no distension.     Tenderness: There is no abdominal tenderness. There is no rebound.  Musculoskeletal:        General: Normal range of motion.     Cervical back: Neck supple.  Lymphadenopathy:     Cervical: No cervical adenopathy.  Skin:    Findings: No erythema or rash.  Neurological:     Mental Status: She is alert and oriented to person, place, and time.     Motor: No abnormal muscle tone.     Coordination: Coordination normal.  Psychiatric:         Behavior: Behavior normal.     ED Results / Procedures / Treatments   Labs (all labs ordered are listed, but only abnormal results are displayed) Labs Reviewed  BRAIN NATRIURETIC PEPTIDE - Abnormal; Notable for the following components:      Result Value   B Natriuretic Peptide 513.0 (*)    All other components within normal limits  BASIC METABOLIC PANEL - Abnormal; Notable for the following components:   Glucose, Bld 112 (*)    Calcium 8.7 (*)    All other components within normal limits  HEPATIC FUNCTION PANEL - Abnormal; Notable for the following components:   Total Bilirubin 2.1 (*)    Bilirubin, Direct 0.3 (*)    Indirect Bilirubin 1.8 (*)    All other components within normal limits  TROPONIN I (HIGH SENSITIVITY) - Abnormal; Notable for the following components:   Troponin I (High Sensitivity) 28 (*)    All other components within normal limits  TROPONIN I (HIGH SENSITIVITY) - Abnormal; Notable for the following components:   Troponin I (High Sensitivity) 27 (*)    All other components within normal limits  CBC WITH DIFFERENTIAL/PLATELET    EKG EKG Interpretation Date/Time:  Saturday September 03 2022 06:39:00 EDT Ventricular Rate:  72 PR Interval:  171 QRS Duration:  135 QT Interval:  447 QTC Calculation: 490 R Axis:   -25  Text Interpretation: Sinus rhythm Atrial premature complex Right bundle branch block Anteroseptal infarct, age indeterminate Baseline wander in lead(s) V6 Confirmed by Bethann Berkshire [phone]) on 09/03/2022 7:30:09 AM  Radiology CT Chest W Contrast  Result Date: 09/03/2022 CLINICAL DATA:  Shortness of breath for the past 3 days. EXAM: CT CHEST WITH CONTRAST TECHNIQUE: Multidetector CT imaging of the chest was performed during intravenous contrast administration. RADIATION DOSE REDUCTION: This exam was performed according to the departmental dose-optimization program which includes automated exposure control, adjustment of the mA and/or kV  according to patient size and/or use of iterative reconstruction technique. CONTRAST:  75mL OMNIPAQUE IOHEXOL 300 MG/ML  SOLN COMPARISON:  Chest x-ray from same day. CT chest dated November 17, 2014. FINDINGS: Cardiovascular: Mild cardiomegaly. No pericardial effusion. No thoracic aortic aneurysm or dissection. Coronary, aortic arch, and branch vessel atherosclerotic vascular disease. No central pulmonary embolism. Mediastinum/Nodes: Prominent mediastinal and right hilar lymph nodes measuring up to 1.3 cm in short axis, likely reactive. No enlarged axillary or left hilar lymph nodes. Thyroid gland, trachea, and esophagus demonstrate no significant findings. Lungs/Pleura: Trace to small pleural effusions, greater on the left. Scattered smooth interlobular septal thickening. Mild centrilobular emphysema. Subsegmental atelectasis in the lingula and medial right middle lobe. No consolidation  or pneumothorax. No suspicious pulmonary nodule. Upper Abdomen: No acute abnormality. Musculoskeletal: No chest wall abnormality. No acute or significant osseous findings. IMPRESSION: 1. Mild congestive heart failure. 2. Aortic Atherosclerosis (ICD10-I70.0) and Emphysema (ICD10-J43.9). Electronically Signed   By: Obie Dredge M.D.   On: 09/03/2022 08:57   DG Chest Portable 1 View  Result Date: 09/03/2022 CLINICAL DATA:  77 year old female with shortness of breath not responding to albuterol. On home oxygen. EXAM: PORTABLE CHEST 1 VIEW COMPARISON:  Chest radiographs 12/25/2020 and earlier. FINDINGS: Portable AP view at 0707 hours. Mild cardiomegaly is stable. Other mediastinal contours are within normal limits. Visualized tracheal air column is within normal limits. Increased interstitial lung markings, symmetric and mildly increased compared to 2022. Diffuse septal thickening on prior 2016 chest CTA. Questionable small left pleural effusion now, as present at that time. No pneumothorax or consolidation. No acute osseous  abnormality identified. Negative visible bowel gas. IMPRESSION: Cardiomegaly with symmetric pulmonary interstitial opacity and possible small left pleural effusion. Consider Acute Interstitial Edema. Viral/atypical respiratory infection or chronic interstitial lung disease felt less likely. Electronically Signed   By: Odessa Fleming M.D.   On: 09/03/2022 07:19    Procedures Procedures    Medications Ordered in ED Medications  methylPREDNISolone sodium succinate (SOLU-MEDROL) 125 mg/2 mL injection 125 mg (125 mg Intravenous Given 09/03/22 0741)  magnesium sulfate IVPB 2 g 50 mL (0 g Intravenous Stopped 09/03/22 0843)  ipratropium-albuterol (DUONEB) 0.5-2.5 (3) MG/3ML nebulizer solution 3 mL (3 mLs Nebulization Given 09/03/22 0820)  albuterol (PROVENTIL) (2.5 MG/3ML) 0.083% nebulizer solution 2.5 mg (2.5 mg Nebulization Given 09/03/22 0820)  iohexol (OMNIPAQUE) 300 MG/ML solution 75 mL (75 mLs Intravenous Contrast Given 09/03/22 0807)  furosemide (LASIX) injection 40 mg (40 mg Intravenous Given 09/03/22 1610)    ED Course/ Medical Decision Making/ A&P                             Medical Decision Making Amount and/or Complexity of Data Reviewed Labs: ordered. Radiology: ordered.  Risk Prescription drug management. Decision regarding hospitalization.     This patient presents to the ED for concern of shortness of breath, this involves an extensive number of treatment options, and is a complaint that carries with it a high risk of complications and morbidity.  The differential diagnosis includes PE, MI, COPD   Co morbidities that complicate the patient evaluation  COPD and heart failure   Additional history obtained:  Additional history obtained from patient External records from outside source obtained and reviewed including hospital records   Lab Tests:  I Ordered, and personally interpreted labs.  The pertinent results include: BNP 513   Imaging Studies ordered:  I ordered  imaging studies including chest x-ray I independently visualized and interpreted imaging which showed interstitial edema I agree with the radiologist interpretation   Cardiac Monitoring: / EKG:  The patient was maintained on a cardiac monitor.  I personally viewed and interpreted the cardiac monitored which showed an underlying rhythm of: Normal sinus rhythm   Consultations Obtained:  I requested consultation with the hospitalist,  and discussed lab and imaging findings as well as pertinent plan - they recommend: Admit   Problem List / ED Course / Critical interventions / Medication management  COPD and heart failure I ordered medication including Lasix for heart failure Reevaluation of the patient after these medicines showed that the patient improved I have reviewed the patients home medicines and have made  adjustments as needed   Social Determinants of Health:  None   Test / Admission - Considered:  None  Patient with hypoxia secondary to worsening congestive heart failure and COPD.  She will be admitted and diuresed        Final Clinical Impression(s) / ED Diagnoses Final diagnoses:  Systolic congestive heart failure, unspecified HF chronicity (HCC)    Rx / DC Orders ED Discharge Orders     None         Bethann Berkshire, MD 09/05/22 1659

## 2022-09-04 DIAGNOSIS — J9601 Acute respiratory failure with hypoxia: Secondary | ICD-10-CM | POA: Diagnosis not present

## 2022-09-04 LAB — BASIC METABOLIC PANEL
Anion gap: 9 (ref 5–15)
BUN: 17 mg/dL (ref 8–23)
CO2: 32 mmol/L (ref 22–32)
Calcium: 8.8 mg/dL — ABNORMAL LOW (ref 8.9–10.3)
Chloride: 97 mmol/L — ABNORMAL LOW (ref 98–111)
Creatinine, Ser: 0.62 mg/dL (ref 0.44–1.00)
GFR, Estimated: >60 mL/min (ref >60)
Glucose, Bld: 129 mg/dL — ABNORMAL HIGH (ref 70–99)
Potassium: 3.1 mmol/L — ABNORMAL LOW (ref 3.5–5.1)
Sodium: 138 mmol/L (ref 135–145)

## 2022-09-04 LAB — MAGNESIUM: Magnesium: 2.1 mg/dL (ref 1.7–2.4)

## 2022-09-04 MED ORDER — DM-GUAIFENESIN ER 30-600 MG PO TB12
1.0000 | ORAL_TABLET | Freq: Two times a day (BID) | ORAL | Status: DC
  Administered 2022-09-04: 1 via ORAL
  Filled 2022-09-04: qty 1

## 2022-09-04 MED ORDER — POTASSIUM CHLORIDE CRYS ER 20 MEQ PO TBCR
40.0000 meq | EXTENDED_RELEASE_TABLET | Freq: Two times a day (BID) | ORAL | Status: DC
  Administered 2022-09-04: 40 meq via ORAL
  Filled 2022-09-04: qty 2

## 2022-09-04 NOTE — Discharge Summary (Signed)
Physician Discharge Summary  Lindsay Whitehead NFA:[id number] DOB: 01/07/1946 DOA: 09/03/2022  PCP: Lindsay Stabile, MD  Admit date: 09/03/2022  Discharge date: 09/04/2022  Admitted From:Home  Disposition:  Home  Recommendations for Outpatient Follow-up:  Follow up with PCP in 1-2 weeks Continue to monitor weight closely and use Lasix as needed Continue other home medications as prior  Home Health: None  Equipment/Devices: None  Discharge Condition:Stable  CODE STATUS: Full  Diet recommendation: Heart Healthy  Brief/Interim Summary: Lindsay Whitehead is a 77 y.o. female with medical history significant for paroxysmal atrial fibrillation/flutter, diastolic CHF, hypertension, dyslipidemia, obesity, and GERD who presented to the ED with 3 days worth of shortness of breath.  She was noted to have pulse ox in the 80th percentile requiring 2 L nasal cannula oxygen.  She does check her weight daily and has gained approximately 6 pounds over the last 1 week and takes Lasix as needed.  She claims to have had Lyme disease approximately 3-4 weeks ago and is completed antibiotics for this but states that she has had some mild persistent symptoms.  Additionally she was recently diagnosed with UTI and did not receive final culture results however she was placed on nitrofurantoin and has had issues with nausea and vomiting as well as tremors related to this medication.  She still feels as though she might have a mild UTI.  Urine analysis, however was checked with no signs of UTI noted.  She has diuresed overnight and is feeling back to her usual baseline and is eager for discharge today.  Reds clip reading of 24% noted and she can ambulate without oxygen requirements.  She is in stable condition for discharge to continue on Lasix as needed and will monitor her weight closely at home.  No other acute events or concerns noted.  2D echocardiogram as noted below with preserved LVEF and severe LVH  noted.  Discharge Diagnoses:  Principal Problem:   Acute hypoxemic respiratory failure (HCC) Active Problems:   Depression   HTN (hypertension)   Acute on chronic diastolic (congestive) heart failure (HCC)  Principal discharge diagnosis: Acute hypoxemic respiratory failure secondary to acute on chronic diastolic CHF exacerbation.  Discharge Instructions  Discharge Instructions     Diet - low sodium heart healthy   Complete by: As directed    Increase activity slowly   Complete by: As directed       Allergies as of 09/04/2022       Reactions   Bystolic [nebivolol Hcl] Other (See Comments)   Extreme chest pains/ lowering of blood pressure causing dizziness, falling   Metoprolol Other (See Comments)   Extreme drop in blood pressure, vertigo resulted   Asa [aspirin]    Bleeding gums   Diltiazem    bleeding gums, stomach ache,weekness    Edarbi [azilsartan] Hives   Zantac [ranitidine Hcl]         Medication List     TAKE these medications    Biotin 46962 MCG Tabs Take 5,000 mcg by mouth daily.   carvedilol 3.125 MG tablet Commonly known as: COREG Take 3.125 mg by mouth daily.   cyanocobalamin 1000 MCG tablet Commonly known as: VITAMIN B12 Take 1,000 mcg by mouth daily.   esomeprazole 20 MG capsule Commonly known as: NEXIUM Take 20 mg by mouth as needed. Reported on 09/07/2015   furosemide 40 MG tablet Commonly known as: LASIX Take 40 mg by mouth as needed.   levothyroxine 25 MCG tablet Commonly known as: SYNTHROID Take  25 mcg by mouth daily before breakfast.   MAGNESIUM-ZINC PO Take by mouth.   methocarbamol 500 MG tablet Commonly known as: ROBAXIN Take 1 tablet (500 mg total) by mouth 2 (two) times daily.   potassium chloride 10 MEQ tablet Commonly known as: KLOR-CON Take 1 tablet (10 mEq total) by mouth daily.        Follow-up Information     Lindsay Stabile, MD. Schedule an appointment as soon as possible for a visit in 1 week(s).    Specialty: Internal Medicine Contact information: [address] [place] [address] [phone]                Allergies  Allergen Reactions   Bystolic [Nebivolol Hcl] Other (See Comments)    Extreme chest pains/ lowering of blood pressure causing dizziness, falling   Metoprolol Other (See Comments)    Extreme drop in blood pressure, vertigo resulted   Asa [Aspirin]     Bleeding gums   Diltiazem     bleeding gums, stomach ache,weekness    Edarbi [Azilsartan] Hives   Zantac [Ranitidine Hcl]     Consultations: None   Procedures/Studies: ECHOCARDIOGRAM COMPLETE  Result Date: 09/03/2022    ECHOCARDIOGRAM REPORT   Patient Name:   Lindsay Whitehead Date of Exam: 09/03/2022 Medical Rec #:  [id number]          Height:       67.0 in Accession #:    [id number]         Weight:       192.9 lb Date of Birth:  01-26-1946          BSA:          1.991 m Patient Age:    77 years           BP:           158/72 mmHg Patient Gender: F                  HR:           76 bpm. [facility] Procedure: 2D Echo, Cardiac Doppler and Color Doppler Indications:    I50.40* Unspecified combined systolic (congestive) and diastolic                 (congestive) heart failure  History:        Patient has prior history of Echocardiogram examinations, most                 recent 09/01/2018. CHF, Abnormal ECG, Arrythmias:Atrial                 Fibrillation, Atrial Flutter and RBBB, Signs/Symptoms:Dyspnea                 and Shortness of Breath; Risk Factors:Hypertension. Ablation                 procedure.  Sonographer:    Sheralyn Boatman RDCS Referring Phys: [id number] [facility] Dowdy [facility]  Sonographer Comments: Image acquisition challenging due to patient body habitus. IMPRESSIONS  1. Left ventricular ejection fraction, by estimation, is 50 to 55%. The left ventricle has low normal function. The left ventricle has no regional wall motion abnormalities. There is severe left ventricular hypertrophy. Left  ventricular diastolic parameters are indeterminate.  2. Right ventricular systolic function is normal. The right ventricular size is mildly enlarged. There is severely elevated pulmonary artery systolic pressure.  3. Left atrial size was moderately dilated.  4. The mitral valve is abnormal. Mild mitral valve regurgitation. No evidence of mitral stenosis. Moderate mitral annular calcification.  5. The aortic valve is tricuspid. Aortic valve regurgitation is not visualized. No aortic stenosis is present.  6. The inferior vena cava is dilated in size with >50% respiratory variability, suggesting right atrial pressure of 8 mmHg. Comparison(s): No significant change from prior study. FINDINGS  Left Ventricle: Left ventricular ejection fraction, by estimation, is 50 to 55%. The left ventricle has low normal function. The left ventricle has no regional wall motion abnormalities. The left ventricular internal cavity size was normal in size. There is severe left ventricular hypertrophy. Left ventricular diastolic parameters are indeterminate. Right Ventricle: The right ventricular size is mildly enlarged. No increase in right ventricular wall thickness. Right ventricular systolic function is normal. There is severely elevated pulmonary artery systolic pressure. The tricuspid regurgitant velocity is 3.64 m/s, and with an assumed right atrial pressure of 8 mmHg, the estimated right ventricular systolic pressure is 61.0 mmHg. Left Atrium: Left atrial size was moderately dilated. Right Atrium: Right atrial size was normal in size. Pericardium: There is no evidence of pericardial effusion. Mitral Valve: The mitral valve is abnormal. Moderate mitral annular calcification. Mild mitral valve regurgitation. No evidence of mitral valve stenosis. Tricuspid Valve: The tricuspid valve is normal in structure. Tricuspid valve regurgitation is trivial. No evidence of tricuspid stenosis. Aortic Valve: The aortic valve is tricuspid. Aortic  valve regurgitation is not visualized. No aortic stenosis is present. Pulmonic Valve: The pulmonic valve was normal in structure. Pulmonic valve regurgitation is not visualized. No evidence of pulmonic stenosis. Aorta: The aortic root and ascending aorta are structurally normal, with no evidence of dilitation. Venous: The inferior vena cava is dilated in size with greater than 50% respiratory variability, suggesting right atrial pressure of 8 mmHg. IAS/Shunts: No atrial level shunt detected by color flow Doppler.  LEFT VENTRICLE PLAX 2D LVIDd:         4.70 cm      Diastology LVIDs:         3.20 cm      LV e' medial:    5.00 cm/s LV PW:         1.90 cm      LV E/e' medial:  14.7 LV IVS:        1.50 cm      LV e' lateral:   11.40 cm/s LVOT diam:     2.30 cm      LV E/e' lateral: 6.4 LV SV:         113 LV SV Index:   57 LVOT Area:     4.15 cm  LV Volumes (MOD) LV vol d, MOD A2C: 146.0 ml LV vol d, MOD A4C: 133.0 ml LV vol s, MOD A2C: 72.5 ml LV vol s, MOD A4C: 57.3 ml LV SV MOD A2C:     73.5 ml LV SV MOD A4C:     133.0 ml LV SV MOD BP:      80.7 ml RIGHT VENTRICLE             IVC RV S prime:     14.90 cm/s  IVC diam: 2.35 cm TAPSE (M-mode): 2.4 cm LEFT ATRIUM             Index        RIGHT ATRIUM           Index LA diam:        5.10 cm 2.56  cm/m   RA Area:     19.60 cm LA Vol (A2C):   65.1 ml 32.69 ml/m  RA Volume:   48.60 ml  24.40 ml/m LA Vol (A4C):   80.4 ml 40.37 ml/m LA Biplane Vol: 73.6 ml 36.96 ml/m  AORTIC VALVE LVOT Vmax:   137.00 cm/s LVOT Vmean:  90.000 cm/s LVOT VTI:    0.271 m  AORTA Ao Root diam: 2.95 cm Ao Asc diam:  3.45 cm MITRAL VALVE                  TRICUSPID VALVE MV Area (PHT): 3.12 cm       TR Peak grad:   53.0 mmHg MV Decel Time: 243 msec       TR Vmax:        364.00 cm/s MR Peak grad:    112.4 mmHg MR Mean grad:    82.0 mmHg    SHUNTS MR Vmax:         530.00 cm/s  Systemic VTI:  0.27 m MR Vmean:        430.0 cm/s   Systemic Diam: 2.30 cm MR PISA:         1.01 cm MR PISA Eff ROA: 8 mm  MR PISA Radius:  0.40 cm MV E velocity: 73.30 cm/s MV A velocity: 51.40 cm/s MV E/A ratio:  1.43 Vishnu Priya Mallipeddi Electronically signed by Winfield Rast Mallipeddi Signature Date/Time: 09/03/2022/4:40:09 PM    Final    CT Chest W Contrast  Result Date: 09/03/2022 CLINICAL DATA:  Shortness of breath for the past 3 days. EXAM: CT CHEST WITH CONTRAST TECHNIQUE: Multidetector CT imaging of the chest was performed during intravenous contrast administration. RADIATION DOSE REDUCTION: This exam was performed according to the departmental dose-optimization program which includes automated exposure control, adjustment of the mA and/or kV according to patient size and/or use of iterative reconstruction technique. CONTRAST:  75mL OMNIPAQUE IOHEXOL 300 MG/ML  SOLN COMPARISON:  Chest x-ray from same day. CT chest dated November 17, 2014. FINDINGS: Cardiovascular: Mild cardiomegaly. No pericardial effusion. No thoracic aortic aneurysm or dissection. Coronary, aortic arch, and branch vessel atherosclerotic vascular disease. No central pulmonary embolism. Mediastinum/Nodes: Prominent mediastinal and right hilar lymph nodes measuring up to 1.3 cm in short axis, likely reactive. No enlarged axillary or left hilar lymph nodes. Thyroid gland, trachea, and esophagus demonstrate no significant findings. Lungs/Pleura: Trace to small pleural effusions, greater on the left. Scattered smooth interlobular septal thickening. Mild centrilobular emphysema. Subsegmental atelectasis in the lingula and medial right middle lobe. No consolidation or pneumothorax. No suspicious pulmonary nodule. Upper Abdomen: No acute abnormality. Musculoskeletal: No chest wall abnormality. No acute or significant osseous findings. IMPRESSION: 1. Mild congestive heart failure. 2. Aortic Atherosclerosis (ICD10-I70.0) and Emphysema (ICD10-J43.9). Electronically Signed   By: Obie Dredge M.D.   On: 09/03/2022 08:57   DG Chest Portable 1 View  Result  Date: 09/03/2022 CLINICAL DATA:  77 year old female with shortness of breath not responding to albuterol. On home oxygen. EXAM: PORTABLE CHEST 1 VIEW COMPARISON:  Chest radiographs 12/25/2020 and earlier. FINDINGS: Portable AP view at 0707 hours. Mild cardiomegaly is stable. Other mediastinal contours are within normal limits. Visualized tracheal air column is within normal limits. Increased interstitial lung markings, symmetric and mildly increased compared to 2022. Diffuse septal thickening on prior 2016 chest CTA. Questionable small left pleural effusion now, as present at that time. No pneumothorax or consolidation. No acute osseous abnormality identified. Negative visible bowel gas. IMPRESSION: Cardiomegaly  with symmetric pulmonary interstitial opacity and possible small left pleural effusion. Consider Acute Interstitial Edema. Viral/atypical respiratory infection or chronic interstitial lung disease felt less likely. Electronically Signed   By: Odessa Fleming M.D.   On: 09/03/2022 07:19     Discharge Exam: Vitals:   09/03/22 2352 09/04/22 0521  BP: (!) 145/72 (!) 157/82  Pulse: 67 64  Resp: 18 20  Temp: 97.9 F (36.6 C) 97.9 F (36.6 C)  SpO2:  98%   Vitals:   09/03/22 1542 09/03/22 2018 09/03/22 2352 09/04/22 0521  BP: (!) 164/61 (!) 167/68 (!) 145/72 (!) 157/82  Pulse: 66 66 67 64  Resp:  18 18 20   Temp: 98.6 F (37 C) 98.3 F (36.8 C) 97.9 F (36.6 C) 97.9 F (36.6 C)  TempSrc: Oral Oral Oral Oral  SpO2: 97% 94% 93% 98%  Weight:    86.9 kg  Height:        General: Pt is alert, awake, not in acute distress Cardiovascular: RRR, S1/S2 +, no rubs, no gallops Respiratory: CTA bilaterally, no wheezing, no rhonchi Abdominal: Soft, NT, ND, bowel sounds + Extremities: no edema, no cyanosis    The results of significant diagnostics from this hospitalization (including imaging, microbiology, ancillary and laboratory) are listed below for reference.     Microbiology: No results found  for this or any previous visit (from the past 240 hour(s)).   Labs: BNP (last 3 results) Recent Labs    09/03/22 0645  BNP 513.0*   Basic Metabolic Panel: Recent Labs  Lab 09/03/22 0645 09/04/22 0503  NA 136 138  K 3.5 3.1*  CL 101 97*  CO2 27 32  GLUCOSE 112* 129*  BUN 11 17  CREATININE 0.69 0.62  CALCIUM 8.7* 8.8*  MG  --  2.1   Liver Function Tests: Recent Labs  Lab 09/03/22 0708  AST 23  ALT 18  ALKPHOS 78  BILITOT 2.1*  PROT 7.2  ALBUMIN 3.7   No results for input(s): "LIPASE", "AMYLASE" in the last 168 hours. No results for input(s): "AMMONIA" in the last 168 hours. CBC: Recent Labs  Lab 09/03/22 0645  WBC 8.6  NEUTROABS 6.9  HGB 12.3  HCT 37.2  MCV 89.0  PLT 172   Cardiac Enzymes: No results for input(s): "CKTOTAL", "CKMB", "CKMBINDEX", "TROPONINI" in the last 168 hours. BNP: Invalid input(s): "POCBNP" CBG: No results for input(s): "GLUCAP" in the last 168 hours. D-Dimer No results for input(s): "DDIMER" in the last 72 hours. Hgb A1c No results for input(s): "HGBA1C" in the last 72 hours. Lipid Profile No results for input(s): "CHOL", "HDL", "LDLCALC", "TRIG", "CHOLHDL", "LDLDIRECT" in the last 72 hours. Thyroid function studies No results for input(s): "TSH", "T4TOTAL", "T3FREE", "THYROIDAB" in the last 72 hours.  Invalid input(s): "FREET3" Anemia work up No results for input(s): "VITAMINB12", "FOLATE", "FERRITIN", "TIBC", "IRON", "RETICCTPCT" in the last 72 hours. Urinalysis    Component Value Date/Time   COLORURINE COLORLESS (A) 09/03/2022 1050   APPEARANCEUR CLEAR 09/03/2022 1050   LABSPEC 1.006 09/03/2022 1050   PHURINE 5.0 09/03/2022 1050   GLUCOSEU NEGATIVE 09/03/2022 1050   HGBUR SMALL (A) 09/03/2022 1050   BILIRUBINUR NEGATIVE 09/03/2022 1050   KETONESUR NEGATIVE 09/03/2022 1050   PROTEINUR NEGATIVE 09/03/2022 1050   UROBILINOGEN 0.2 07/18/2014 1158   NITRITE NEGATIVE 09/03/2022 1050   LEUKOCYTESUR NEGATIVE 09/03/2022  1050   Sepsis Labs Recent Labs  Lab 09/03/22 0645  WBC 8.6   Microbiology No results found for this or any  previous visit (from the past 240 hour(s)).   Time coordinating discharge: 35 minutes  SIGNED:   Erick Blinks, DO [facility] 09/04/2022, 10:58 AM  If 7PM-7AM, please contact night-coverage [email or web address]

## 2022-09-04 NOTE — TOC Transition Note (Signed)
Transition of Care [facility]) - CM/SW Discharge Note   Patient Details  Name: Cybele Follman MRN: [id number] Date of Birth: [birth year]  Transition of Care [facility]) CM/SW Contact:  Catalina Gravel, LCSW Phone Number: 09/04/2022, 12:39 PM   Clinical Narrative:    CSW visited Pt at bedside for  [facility] consulted for CHF education. Pt states that she already weighs daily, discussed low sodium diet and monitoring if weight increases to contact Dr.  Rock Nephew agreed stated fluid was taken off during this shay and she is aware.  CHF education added to AVS. No further TOC needs pt DC.     Barriers to Discharge: No Barriers Identified   Patient Goals and CMS Choice      Discharge Placement                         Discharge Plan and Services Additional resources added to the After Visit Summary for                                       Social Determinants of Health (SDOH) Interventions SDOH Screenings   Food Insecurity: No Food Insecurity (09/03/2022)  Housing: Low Risk  (09/03/2022)  Transportation Needs: No Transportation Needs (09/03/2022)  Utilities: Not At Risk (09/03/2022)  Tobacco Use: Low Risk  (09/03/2022)     Readmission Risk Interventions     No data to display

## 2022-09-04 NOTE — Progress Notes (Signed)
Patient states understanding of discharge instructions.  

## 2022-09-04 NOTE — Care Management Obs Status (Signed)
MEDICARE OBSERVATION STATUS NOTIFICATION   Patient Details  Name: Lindsay Whitehead MRN: [id number] Date of Birth: 1945-08-06   Medicare Observation Status Notification Given:  Yes    Kaelynn Igo Marsh Dolly, LCSW 09/04/2022, 12:27 PM

## 2022-09-04 NOTE — Progress Notes (Signed)
   09/04/22 0748  ReDS Vest / Clip  Station Marker D  Ruler Value 3  ReDS Value Range < 36  ReDS Actual Value 24

## 2022-09-04 NOTE — Progress Notes (Signed)
SATURATION QUALIFICATIONS: (This note is used to comply with regulatory documentation for home oxygen)  Patient Saturations on Room Air at Rest = 98%  Patient Saturations on Room Air while Ambulating = 91%   

## 2022-09-09 DIAGNOSIS — E039 Hypothyroidism, unspecified: Secondary | ICD-10-CM | POA: Diagnosis not present

## 2022-09-09 DIAGNOSIS — E782 Mixed hyperlipidemia: Secondary | ICD-10-CM | POA: Diagnosis not present

## 2022-09-09 DIAGNOSIS — E559 Vitamin D deficiency, unspecified: Secondary | ICD-10-CM | POA: Diagnosis not present

## 2022-09-15 DIAGNOSIS — J4520 Mild intermittent asthma, uncomplicated: Secondary | ICD-10-CM | POA: Diagnosis not present

## 2022-09-15 DIAGNOSIS — E039 Hypothyroidism, unspecified: Secondary | ICD-10-CM | POA: Diagnosis not present

## 2022-09-15 DIAGNOSIS — J9601 Acute respiratory failure with hypoxia: Secondary | ICD-10-CM | POA: Diagnosis not present

## 2022-09-15 DIAGNOSIS — I480 Paroxysmal atrial fibrillation: Secondary | ICD-10-CM | POA: Diagnosis not present

## 2022-09-15 DIAGNOSIS — G4730 Sleep apnea, unspecified: Secondary | ICD-10-CM | POA: Diagnosis not present

## 2022-09-15 DIAGNOSIS — L659 Nonscarring hair loss, unspecified: Secondary | ICD-10-CM | POA: Diagnosis not present

## 2022-09-15 DIAGNOSIS — I5022 Chronic systolic (congestive) heart failure: Secondary | ICD-10-CM | POA: Diagnosis not present

## 2022-09-15 DIAGNOSIS — R7301 Impaired fasting glucose: Secondary | ICD-10-CM | POA: Diagnosis not present

## 2022-09-15 DIAGNOSIS — I110 Hypertensive heart disease with heart failure: Secondary | ICD-10-CM | POA: Diagnosis not present

## 2022-09-15 DIAGNOSIS — E782 Mixed hyperlipidemia: Secondary | ICD-10-CM | POA: Diagnosis not present

## 2022-09-15 DIAGNOSIS — D509 Iron deficiency anemia, unspecified: Secondary | ICD-10-CM | POA: Diagnosis not present

## 2022-09-15 DIAGNOSIS — I10 Essential (primary) hypertension: Secondary | ICD-10-CM | POA: Diagnosis not present

## 2022-10-21 DIAGNOSIS — I5022 Chronic systolic (congestive) heart failure: Secondary | ICD-10-CM | POA: Diagnosis not present

## 2022-10-21 DIAGNOSIS — K219 Gastro-esophageal reflux disease without esophagitis: Secondary | ICD-10-CM | POA: Diagnosis not present

## 2022-10-21 DIAGNOSIS — G4730 Sleep apnea, unspecified: Secondary | ICD-10-CM | POA: Diagnosis not present

## 2022-10-21 DIAGNOSIS — E669 Obesity, unspecified: Secondary | ICD-10-CM | POA: Diagnosis not present

## 2022-10-21 DIAGNOSIS — J302 Other seasonal allergic rhinitis: Secondary | ICD-10-CM | POA: Diagnosis not present

## 2022-10-21 DIAGNOSIS — J4520 Mild intermittent asthma, uncomplicated: Secondary | ICD-10-CM | POA: Diagnosis not present

## 2022-10-21 DIAGNOSIS — J9601 Acute respiratory failure with hypoxia: Secondary | ICD-10-CM | POA: Diagnosis not present

## 2022-10-21 DIAGNOSIS — I480 Paroxysmal atrial fibrillation: Secondary | ICD-10-CM | POA: Diagnosis not present

## 2022-10-21 DIAGNOSIS — R600 Localized edema: Secondary | ICD-10-CM | POA: Diagnosis not present

## 2022-11-28 DIAGNOSIS — I5022 Chronic systolic (congestive) heart failure: Secondary | ICD-10-CM | POA: Diagnosis not present

## 2022-11-28 DIAGNOSIS — I110 Hypertensive heart disease with heart failure: Secondary | ICD-10-CM | POA: Diagnosis not present

## 2022-11-28 DIAGNOSIS — R296 Repeated falls: Secondary | ICD-10-CM | POA: Diagnosis not present

## 2022-11-28 DIAGNOSIS — Z87891 Personal history of nicotine dependence: Secondary | ICD-10-CM | POA: Diagnosis not present

## 2022-11-28 DIAGNOSIS — I4892 Unspecified atrial flutter: Secondary | ICD-10-CM | POA: Diagnosis not present

## 2022-11-28 DIAGNOSIS — R9431 Abnormal electrocardiogram [ECG] [EKG]: Secondary | ICD-10-CM | POA: Diagnosis not present

## 2022-11-28 DIAGNOSIS — I428 Other cardiomyopathies: Secondary | ICD-10-CM | POA: Diagnosis not present

## 2022-11-28 DIAGNOSIS — I480 Paroxysmal atrial fibrillation: Secondary | ICD-10-CM | POA: Diagnosis not present

## 2022-11-28 DIAGNOSIS — Z9889 Other specified postprocedural states: Secondary | ICD-10-CM | POA: Diagnosis not present

## 2022-11-30 DIAGNOSIS — H524 Presbyopia: Secondary | ICD-10-CM | POA: Diagnosis not present

## 2023-01-17 DIAGNOSIS — J9601 Acute respiratory failure with hypoxia: Secondary | ICD-10-CM | POA: Diagnosis not present

## 2023-01-17 DIAGNOSIS — I110 Hypertensive heart disease with heart failure: Secondary | ICD-10-CM | POA: Diagnosis not present

## 2023-01-17 DIAGNOSIS — L659 Nonscarring hair loss, unspecified: Secondary | ICD-10-CM | POA: Diagnosis not present

## 2023-01-17 DIAGNOSIS — I5022 Chronic systolic (congestive) heart failure: Secondary | ICD-10-CM | POA: Diagnosis not present

## 2023-01-17 DIAGNOSIS — I10 Essential (primary) hypertension: Secondary | ICD-10-CM | POA: Diagnosis not present

## 2023-01-17 DIAGNOSIS — J4520 Mild intermittent asthma, uncomplicated: Secondary | ICD-10-CM | POA: Diagnosis not present

## 2023-01-17 DIAGNOSIS — J302 Other seasonal allergic rhinitis: Secondary | ICD-10-CM | POA: Diagnosis not present

## 2023-01-17 DIAGNOSIS — I480 Paroxysmal atrial fibrillation: Secondary | ICD-10-CM | POA: Diagnosis not present

## 2023-01-17 DIAGNOSIS — R600 Localized edema: Secondary | ICD-10-CM | POA: Diagnosis not present

## 2023-01-17 DIAGNOSIS — K219 Gastro-esophageal reflux disease without esophagitis: Secondary | ICD-10-CM | POA: Diagnosis not present

## 2023-01-17 DIAGNOSIS — G4730 Sleep apnea, unspecified: Secondary | ICD-10-CM | POA: Diagnosis not present

## 2023-01-17 DIAGNOSIS — E669 Obesity, unspecified: Secondary | ICD-10-CM | POA: Diagnosis not present

## 2023-01-30 DIAGNOSIS — I10 Essential (primary) hypertension: Secondary | ICD-10-CM | POA: Diagnosis not present

## 2023-01-30 DIAGNOSIS — I5022 Chronic systolic (congestive) heart failure: Secondary | ICD-10-CM | POA: Diagnosis not present

## 2023-01-30 DIAGNOSIS — I4892 Unspecified atrial flutter: Secondary | ICD-10-CM | POA: Diagnosis not present

## 2023-01-30 DIAGNOSIS — I480 Paroxysmal atrial fibrillation: Secondary | ICD-10-CM | POA: Diagnosis not present

## 2023-01-30 DIAGNOSIS — I428 Other cardiomyopathies: Secondary | ICD-10-CM | POA: Diagnosis not present

## 2023-05-31 DIAGNOSIS — E559 Vitamin D deficiency, unspecified: Secondary | ICD-10-CM | POA: Diagnosis not present

## 2023-05-31 DIAGNOSIS — E782 Mixed hyperlipidemia: Secondary | ICD-10-CM | POA: Diagnosis not present

## 2023-05-31 DIAGNOSIS — E039 Hypothyroidism, unspecified: Secondary | ICD-10-CM | POA: Diagnosis not present

## 2023-05-31 DIAGNOSIS — R7301 Impaired fasting glucose: Secondary | ICD-10-CM | POA: Diagnosis not present

## 2023-06-05 DIAGNOSIS — J302 Other seasonal allergic rhinitis: Secondary | ICD-10-CM | POA: Diagnosis not present

## 2023-06-05 DIAGNOSIS — I5022 Chronic systolic (congestive) heart failure: Secondary | ICD-10-CM | POA: Diagnosis not present

## 2023-06-05 DIAGNOSIS — I10 Essential (primary) hypertension: Secondary | ICD-10-CM | POA: Diagnosis not present

## 2023-06-05 DIAGNOSIS — K219 Gastro-esophageal reflux disease without esophagitis: Secondary | ICD-10-CM | POA: Diagnosis not present

## 2023-06-05 DIAGNOSIS — R600 Localized edema: Secondary | ICD-10-CM | POA: Diagnosis not present

## 2023-06-05 DIAGNOSIS — I480 Paroxysmal atrial fibrillation: Secondary | ICD-10-CM | POA: Diagnosis not present

## 2023-06-05 DIAGNOSIS — E782 Mixed hyperlipidemia: Secondary | ICD-10-CM | POA: Diagnosis not present

## 2023-06-05 DIAGNOSIS — I110 Hypertensive heart disease with heart failure: Secondary | ICD-10-CM | POA: Diagnosis not present

## 2023-06-05 DIAGNOSIS — J449 Chronic obstructive pulmonary disease, unspecified: Secondary | ICD-10-CM | POA: Diagnosis not present

## 2023-06-05 DIAGNOSIS — J9601 Acute respiratory failure with hypoxia: Secondary | ICD-10-CM | POA: Diagnosis not present

## 2023-06-05 DIAGNOSIS — J4520 Mild intermittent asthma, uncomplicated: Secondary | ICD-10-CM | POA: Diagnosis not present

## 2023-06-05 DIAGNOSIS — L659 Nonscarring hair loss, unspecified: Secondary | ICD-10-CM | POA: Diagnosis not present

## 2023-08-02 DIAGNOSIS — I480 Paroxysmal atrial fibrillation: Secondary | ICD-10-CM | POA: Diagnosis not present

## 2023-08-02 DIAGNOSIS — I10 Essential (primary) hypertension: Secondary | ICD-10-CM | POA: Diagnosis not present

## 2023-08-14 DIAGNOSIS — I10 Essential (primary) hypertension: Secondary | ICD-10-CM | POA: Diagnosis not present

## 2023-08-14 DIAGNOSIS — I480 Paroxysmal atrial fibrillation: Secondary | ICD-10-CM | POA: Diagnosis not present

## 2023-10-05 DIAGNOSIS — R5383 Other fatigue: Secondary | ICD-10-CM | POA: Diagnosis not present

## 2023-10-05 DIAGNOSIS — L209 Atopic dermatitis, unspecified: Secondary | ICD-10-CM | POA: Diagnosis not present

## 2023-11-09 DIAGNOSIS — R519 Headache, unspecified: Secondary | ICD-10-CM | POA: Diagnosis not present

## 2023-11-09 DIAGNOSIS — I517 Cardiomegaly: Secondary | ICD-10-CM | POA: Diagnosis not present

## 2023-11-09 DIAGNOSIS — I452 Bifascicular block: Secondary | ICD-10-CM | POA: Diagnosis not present

## 2023-11-09 DIAGNOSIS — I4892 Unspecified atrial flutter: Secondary | ICD-10-CM | POA: Diagnosis not present

## 2023-11-09 DIAGNOSIS — Z87891 Personal history of nicotine dependence: Secondary | ICD-10-CM | POA: Diagnosis not present

## 2023-11-09 DIAGNOSIS — I4891 Unspecified atrial fibrillation: Secondary | ICD-10-CM | POA: Diagnosis not present

## 2023-11-09 DIAGNOSIS — J984 Other disorders of lung: Secondary | ICD-10-CM | POA: Diagnosis not present

## 2023-11-10 DIAGNOSIS — I4892 Unspecified atrial flutter: Secondary | ICD-10-CM | POA: Diagnosis not present

## 2023-11-11 DIAGNOSIS — Z8679 Personal history of other diseases of the circulatory system: Secondary | ICD-10-CM | POA: Diagnosis not present

## 2023-11-11 DIAGNOSIS — I480 Paroxysmal atrial fibrillation: Secondary | ICD-10-CM | POA: Diagnosis not present

## 2023-11-11 DIAGNOSIS — Z9889 Other specified postprocedural states: Secondary | ICD-10-CM | POA: Diagnosis not present

## 2023-11-11 DIAGNOSIS — R0602 Shortness of breath: Secondary | ICD-10-CM | POA: Diagnosis not present

## 2023-11-11 DIAGNOSIS — I4892 Unspecified atrial flutter: Secondary | ICD-10-CM | POA: Diagnosis not present

## 2023-11-11 DIAGNOSIS — I428 Other cardiomyopathies: Secondary | ICD-10-CM | POA: Diagnosis not present

## 2023-11-20 ENCOUNTER — Other Ambulatory Visit (HOSPITAL_COMMUNITY): Payer: Self-pay | Admitting: Nurse Practitioner

## 2023-11-20 DIAGNOSIS — R42 Dizziness and giddiness: Secondary | ICD-10-CM

## 2023-11-20 DIAGNOSIS — Z09 Encounter for follow-up examination after completed treatment for conditions other than malignant neoplasm: Secondary | ICD-10-CM | POA: Diagnosis not present

## 2023-11-20 DIAGNOSIS — Z0000 Encounter for general adult medical examination without abnormal findings: Secondary | ICD-10-CM | POA: Diagnosis not present

## 2023-11-20 DIAGNOSIS — Z0001 Encounter for general adult medical examination with abnormal findings: Secondary | ICD-10-CM | POA: Diagnosis not present

## 2023-11-21 ENCOUNTER — Ambulatory Visit (HOSPITAL_COMMUNITY)
Admission: RE | Admit: 2023-11-21 | Discharge: 2023-11-21 | Disposition: A | Discharge status: Home / Self Care | Source: Ambulatory Visit | Attending: Nurse Practitioner | Admitting: Nurse Practitioner

## 2023-11-21 DIAGNOSIS — R42 Dizziness and giddiness: Secondary | ICD-10-CM | POA: Insufficient documentation

## 2023-11-23 DIAGNOSIS — H5203 Hypermetropia, bilateral: Secondary | ICD-10-CM | POA: Diagnosis not present

## 2023-11-23 DIAGNOSIS — H52223 Regular astigmatism, bilateral: Secondary | ICD-10-CM | POA: Diagnosis not present

## 2023-12-05 DIAGNOSIS — R39198 Other difficulties with micturition: Secondary | ICD-10-CM | POA: Diagnosis not present

## 2023-12-30 DIAGNOSIS — I509 Heart failure, unspecified: Secondary | ICD-10-CM | POA: Diagnosis not present

## 2023-12-30 DIAGNOSIS — R34 Anuria and oliguria: Secondary | ICD-10-CM | POA: Diagnosis not present

## 2024-01-25 ENCOUNTER — Encounter: Payer: Self-pay | Admitting: *Deleted

## 2024-01-25 NOTE — Progress Notes (Signed)
 Lindsay Whitehead                                          MRN: [id number]   01/25/2024   The [facility] Quality Team Specialist reviewed this patient medical record for the purposes of chart review for care gap closure. The following were reviewed: chart review for care gap closure-controlling blood pressure.    [facility] Quality Team
